# Patient Record
Sex: Male | Born: 1935 | Race: Black or African American | Hispanic: No | Marital: Married | Smoking: Current every day smoker
Health system: Southern US, Community
[De-identification: ages and names within clinical notes are randomized; demographics above are authoritative.]

## PROBLEM LIST (undated history)

## (undated) DIAGNOSIS — Z9889 Other specified postprocedural states: Secondary | ICD-10-CM

## (undated) DIAGNOSIS — R112 Nausea with vomiting, unspecified: Secondary | ICD-10-CM

## (undated) DIAGNOSIS — C801 Malignant (primary) neoplasm, unspecified: Secondary | ICD-10-CM

## (undated) DIAGNOSIS — J449 Chronic obstructive pulmonary disease, unspecified: Secondary | ICD-10-CM

## (undated) DIAGNOSIS — Z9289 Personal history of other medical treatment: Secondary | ICD-10-CM

## (undated) DIAGNOSIS — K219 Gastro-esophageal reflux disease without esophagitis: Secondary | ICD-10-CM

## (undated) DIAGNOSIS — I10 Essential (primary) hypertension: Secondary | ICD-10-CM

## (undated) HISTORY — PX: TONSILLECTOMY: SUR1361

## (undated) HISTORY — PX: OTHER SURGICAL HISTORY: SHX169

## (undated) HISTORY — PX: BACK SURGERY: SHX140

---

## 2001-07-26 ENCOUNTER — Encounter: Payer: Self-pay | Admitting: *Deleted

## 2001-07-26 ENCOUNTER — Encounter: Admission: RE | Admit: 2001-07-26 | Discharge: 2001-07-26 | Payer: Self-pay | Admitting: *Deleted

## 2001-11-21 ENCOUNTER — Encounter (INDEPENDENT_AMBULATORY_CARE_PROVIDER_SITE_OTHER): Payer: Self-pay | Admitting: *Deleted

## 2001-11-21 ENCOUNTER — Ambulatory Visit (HOSPITAL_COMMUNITY): Admission: RE | Admit: 2001-11-21 | Discharge: 2001-11-21 | Payer: Self-pay | Admitting: Gastroenterology

## 2002-09-19 ENCOUNTER — Encounter (INDEPENDENT_AMBULATORY_CARE_PROVIDER_SITE_OTHER): Payer: Self-pay | Admitting: Specialist

## 2002-09-19 ENCOUNTER — Ambulatory Visit (HOSPITAL_COMMUNITY): Admission: RE | Admit: 2002-09-19 | Discharge: 2002-09-19 | Payer: Self-pay | Admitting: Gastroenterology

## 2004-11-13 DIAGNOSIS — Z9289 Personal history of other medical treatment: Secondary | ICD-10-CM

## 2004-11-13 HISTORY — DX: Personal history of other medical treatment: Z92.89

## 2005-08-22 ENCOUNTER — Encounter: Payer: Self-pay | Admitting: Emergency Medicine

## 2005-08-23 ENCOUNTER — Inpatient Hospital Stay (HOSPITAL_COMMUNITY): Admission: EM | Admit: 2005-08-23 | Discharge: 2005-08-30 | Payer: Self-pay | Admitting: Emergency Medicine

## 2005-08-23 ENCOUNTER — Ambulatory Visit: Payer: Self-pay | Admitting: Physical Medicine & Rehabilitation

## 2005-08-30 ENCOUNTER — Inpatient Hospital Stay (HOSPITAL_COMMUNITY)
Admission: RE | Admit: 2005-08-30 | Discharge: 2005-09-06 | Payer: Self-pay | Admitting: Physical Medicine & Rehabilitation

## 2005-09-07 ENCOUNTER — Encounter
Admission: RE | Admit: 2005-09-07 | Discharge: 2005-10-08 | Payer: Self-pay | Admitting: Physical Medicine & Rehabilitation

## 2005-10-09 ENCOUNTER — Encounter
Admission: RE | Admit: 2005-10-09 | Discharge: 2005-11-07 | Payer: Self-pay | Admitting: Physical Medicine & Rehabilitation

## 2005-11-07 ENCOUNTER — Encounter
Admission: RE | Admit: 2005-11-07 | Discharge: 2006-02-05 | Payer: Self-pay | Admitting: Physical Medicine & Rehabilitation

## 2006-01-04 ENCOUNTER — Ambulatory Visit: Payer: Self-pay | Admitting: Oncology

## 2006-05-11 ENCOUNTER — Ambulatory Visit: Payer: Self-pay | Admitting: Oncology

## 2006-05-21 LAB — CBC WITH DIFFERENTIAL/PLATELET
Basophils Absolute: 0 10*3/uL (ref 0.0–0.1)
HCT: 41.8 % (ref 38.7–49.9)
HGB: 14.8 g/dL (ref 13.0–17.1)
MCH: 36.5 pg — ABNORMAL HIGH (ref 28.0–33.4)
MONO#: 0.5 10*3/uL (ref 0.1–0.9)
NEUT%: 45.5 % (ref 40.0–75.0)
Platelets: 260 10*3/uL (ref 145–400)
WBC: 2.8 10*3/uL — ABNORMAL LOW (ref 4.0–10.0)
lymph#: 1 10*3/uL (ref 0.9–3.3)

## 2006-08-30 ENCOUNTER — Encounter: Admission: RE | Admit: 2006-08-30 | Discharge: 2006-08-30 | Payer: Self-pay | Admitting: *Deleted

## 2006-11-13 DIAGNOSIS — C801 Malignant (primary) neoplasm, unspecified: Secondary | ICD-10-CM

## 2006-11-13 HISTORY — DX: Malignant (primary) neoplasm, unspecified: C80.1

## 2007-02-13 ENCOUNTER — Ambulatory Visit: Payer: Self-pay | Admitting: Oncology

## 2008-09-30 ENCOUNTER — Ambulatory Visit: Admission: RE | Admit: 2008-09-30 | Discharge: 2008-10-19 | Payer: Self-pay | Admitting: Radiation Oncology

## 2008-10-12 ENCOUNTER — Encounter: Admission: RE | Admit: 2008-10-12 | Discharge: 2008-10-12 | Payer: Self-pay | Admitting: Urology

## 2008-11-23 ENCOUNTER — Ambulatory Visit (HOSPITAL_BASED_OUTPATIENT_CLINIC_OR_DEPARTMENT_OTHER): Admission: RE | Admit: 2008-11-23 | Discharge: 2008-11-23 | Payer: Self-pay | Admitting: Urology

## 2008-12-14 ENCOUNTER — Ambulatory Visit: Admission: RE | Admit: 2008-12-14 | Discharge: 2008-12-24 | Payer: Self-pay | Admitting: Radiation Oncology

## 2009-08-24 ENCOUNTER — Encounter: Admission: RE | Admit: 2009-08-24 | Discharge: 2009-08-24 | Payer: Self-pay | Admitting: Neurosurgery

## 2011-02-27 LAB — COMPREHENSIVE METABOLIC PANEL
ALT: 29 U/L (ref 0–53)
Alkaline Phosphatase: 122 U/L — ABNORMAL HIGH (ref 39–117)
BUN: 9 mg/dL (ref 6–23)
CO2: 28 mEq/L (ref 19–32)
Calcium: 10.1 mg/dL (ref 8.4–10.5)
GFR calc non Af Amer: >60 mL/min (ref >60)
Glucose, Bld: 97 mg/dL (ref 70–99)
Sodium: 137 mEq/L (ref 135–145)
Total Protein: 7.7 g/dL (ref 6.0–8.3)

## 2011-02-27 LAB — CBC
HCT: 43.5 % (ref 39.0–52.0)
Hemoglobin: 14.9 g/dL (ref 13.0–17.0)
MCHC: 34.3 g/dL (ref 30.0–36.0)
MCV: 103.4 fL — ABNORMAL HIGH (ref 78.0–100.0)
Platelets: 254 10*3/uL (ref 150–400)
RBC: 4.21 MIL/uL — ABNORMAL LOW (ref 4.22–5.81)
RDW: 14.4 % (ref 11.5–15.5)
WBC: 3.3 10*3/uL — ABNORMAL LOW (ref 4.0–10.5)

## 2011-02-27 LAB — PROTIME-INR
INR: 0.9 (ref 0.00–1.49)
Prothrombin Time: 11.7 seconds (ref 11.6–15.2)

## 2011-03-23 ENCOUNTER — Observation Stay (HOSPITAL_COMMUNITY)
Admission: EM | Admit: 2011-03-23 | Discharge: 2011-03-24 | Disposition: A | Discharge status: Home / Self Care | Payer: Medicare Other | Attending: Internal Medicine | Admitting: Internal Medicine

## 2011-03-23 ENCOUNTER — Emergency Department (HOSPITAL_COMMUNITY): Payer: Medicare Other

## 2011-03-23 DIAGNOSIS — I10 Essential (primary) hypertension: Secondary | ICD-10-CM | POA: Insufficient documentation

## 2011-03-23 DIAGNOSIS — E860 Dehydration: Secondary | ICD-10-CM | POA: Insufficient documentation

## 2011-03-23 DIAGNOSIS — M549 Dorsalgia, unspecified: Secondary | ICD-10-CM | POA: Insufficient documentation

## 2011-03-23 DIAGNOSIS — Z79899 Other long term (current) drug therapy: Secondary | ICD-10-CM | POA: Insufficient documentation

## 2011-03-23 DIAGNOSIS — F341 Dysthymic disorder: Secondary | ICD-10-CM | POA: Insufficient documentation

## 2011-03-23 DIAGNOSIS — F1010 Alcohol abuse, uncomplicated: Secondary | ICD-10-CM | POA: Insufficient documentation

## 2011-03-23 DIAGNOSIS — F17200 Nicotine dependence, unspecified, uncomplicated: Secondary | ICD-10-CM | POA: Insufficient documentation

## 2011-03-23 DIAGNOSIS — Z7982 Long term (current) use of aspirin: Secondary | ICD-10-CM | POA: Insufficient documentation

## 2011-03-23 DIAGNOSIS — R569 Unspecified convulsions: Secondary | ICD-10-CM | POA: Insufficient documentation

## 2011-03-23 DIAGNOSIS — R55 Syncope and collapse: Principal | ICD-10-CM | POA: Insufficient documentation

## 2011-03-23 DIAGNOSIS — N400 Enlarged prostate without lower urinary tract symptoms: Secondary | ICD-10-CM | POA: Insufficient documentation

## 2011-03-23 LAB — BASIC METABOLIC PANEL
BUN: 12 mg/dL (ref 6–23)
CO2: 23 mEq/L (ref 19–32)
Calcium: 9.2 mg/dL (ref 8.4–10.5)
Chloride: 100 mEq/L (ref 96–112)
Creatinine, Ser: 0.89 mg/dL (ref 0.4–1.5)
Glucose, Bld: 95 mg/dL (ref 70–99)

## 2011-03-23 LAB — DIFFERENTIAL
Eosinophils Relative: 0 % (ref 0–5)
Lymphocytes Relative: 19 % (ref 12–46)
Monocytes Absolute: 0.5 10*3/uL (ref 0.1–1.0)
Monocytes Relative: 9 % (ref 3–12)
Neutro Abs: 3.7 10*3/uL (ref 1.7–7.7)

## 2011-03-23 LAB — CBC
HCT: 36.9 % — ABNORMAL LOW (ref 39.0–52.0)
Hemoglobin: 13.2 g/dL (ref 13.0–17.0)
MCH: 35.3 pg — ABNORMAL HIGH (ref 26.0–34.0)
MCHC: 35.8 g/dL (ref 30.0–36.0)
RDW: 13.8 % (ref 11.5–15.5)

## 2011-03-23 LAB — GLUCOSE, CAPILLARY: Glucose-Capillary: 99 mg/dL (ref 70–99)

## 2011-03-24 ENCOUNTER — Observation Stay (HOSPITAL_COMMUNITY): Payer: Medicare Other

## 2011-03-24 ENCOUNTER — Other Ambulatory Visit (HOSPITAL_COMMUNITY): Payer: Federal, State, Local not specified - PPO

## 2011-03-24 LAB — COMPREHENSIVE METABOLIC PANEL
AST: 49 U/L — ABNORMAL HIGH (ref 0–37)
CO2: 24 mEq/L (ref 19–32)
Calcium: 9 mg/dL (ref 8.4–10.5)
Creatinine, Ser: 0.91 mg/dL (ref 0.4–1.5)
GFR calc Af Amer: >60 mL/min (ref >60)
GFR calc non Af Amer: >60 mL/min (ref >60)
Total Protein: 6.5 g/dL (ref 6.0–8.3)

## 2011-03-24 LAB — CBC
Hemoglobin: 12.3 g/dL — ABNORMAL LOW (ref 13.0–17.0)
MCH: 35 pg — ABNORMAL HIGH (ref 26.0–34.0)
MCHC: 35.1 g/dL (ref 30.0–36.0)
RDW: 13.9 % (ref 11.5–15.5)

## 2011-03-24 LAB — DIFFERENTIAL
Basophils Absolute: 0 10*3/uL (ref 0.0–0.1)
Basophils Relative: 0 % (ref 0–1)
Eosinophils Absolute: 0 10*3/uL (ref 0.0–0.7)
Eosinophils Relative: 1 % (ref 0–5)
Monocytes Absolute: 0.5 10*3/uL (ref 0.1–1.0)
Monocytes Relative: 15 % — ABNORMAL HIGH (ref 3–12)
Neutro Abs: 1.6 10*3/uL — ABNORMAL LOW (ref 1.7–7.7)

## 2011-03-28 NOTE — Op Note (Signed)
NAME:  Wyatt Stein, Wyatt Stein NO.:  [id number]   MEDICAL RECORD NO.:  [id number]          PATIENT TYPE:  AMB   LOCATION:  N[facility]                         FACILITY:  [facility]   PHYSICIAN:  Lindaann Slough, M.D.  DATE OF BIRTH:  08-28-1934   DATE OF PROCEDURE:  11/23/2008  DATE OF DISCHARGE:                               OPERATIVE REPORT   PREOPERATIVE DIAGNOSIS:  Adenocarcinoma of prostate.   POSTOPERATIVE DIAGNOSIS:  Adenocarcinoma of prostate.   PROCEDURE:  I-125 seed implantation per cystoscopy.   SURGEON:  Dr. Brunilda Payor and Dr. Dayton Scrape.   ANESTHESIA:  General.   DATE OF PROCEDURE:  November 23, 2008.   INDICATIONS:  The patient is a 77 year-old male who had positive  prostate biopsy for adenocarcinoma of the prostate, Gleason score 6.  PSA at diagnosis is 6.52.  Treatment options were discussed with the  patient and he chose to have radiation treatment.  He saw Dr. Dayton Scrape,  and he advised him to have seeds implantation and the patient is  scheduled today for the procedure.   The patient was identified by his wrist band and a proper time-out was  taken.   He was then placed in the dorsal lithotomy position.  Ultrasound  planning was done by Dr. Dayton Scrape and when planning was completed under  ultrasound guidance and with Nucletron, a total of 63 seeds were  implanted in the prostate through 24 needles.  There appeared to be good  seed distribution at the end of the procedure.  The Foley catheter that  was previously inserted in the bladder was then removed.  A flexible  cystoscope was inserted in the bladder.  The anterior urethra is normal.  He has moderate prostatic hypertrophy.  The bladder mucosa is normal.  There is no stone, seed or tumor in the bladder.  The ureteral orifices  are in normal position and shape.  The cystoscope was then removed.  A  #16 Foley catheter was then inserted in the bladder.   The patient tolerated the procedure well and left the OR in  satisfactory  condition to the Post Anesthesia Care Unit.      Lindaann Slough, M.D.  Electronically Signed    MN/MEDQ  D:  11/23/2008  T:  11/23/2008  Job:  [id number]   cc:   Maryln Gottron, M.D.  Fax: [phone]

## 2011-03-30 ENCOUNTER — Other Ambulatory Visit: Payer: Self-pay | Admitting: Neurosurgery

## 2011-03-30 DIAGNOSIS — S32009A Unspecified fracture of unspecified lumbar vertebra, initial encounter for closed fracture: Secondary | ICD-10-CM

## 2011-03-31 NOTE — Discharge Summary (Signed)
NAME:  Wyatt Stein, Wyatt Stein NO.:  [id number]   MEDICAL RECORD NO.:  [id number]          PATIENT TYPE:  IPS   LOCATION:  4003                         FACILITY:  [facility]   PHYSICIAN:  Ellwood Dense, M.D.   DATE OF BIRTH:  04/04/1934   DATE OF ADMISSION:  08/30/2005  DATE OF DISCHARGE:  09/06/2005                                 DISCHARGE SUMMARY   DISCHARGE DIAGNOSIS:  1.  Spinal cord injury with L1, L2, and L4 fractures with L2 and L4      retropulsion requiring decompression with fusion T11 through S1.  2.  Traumatic hematuria resolved.  3.  Anemia.  4.  Tachycardia, resolved.   HISTORY OF PRESENT ILLNESS:  Wyatt Stein is a 77 year old male with a history  of hypertension and GERD who fell off the roof on October 10 and was noted  to be positive for alcohol with alcohol level at 191 on admission.  Evaluation revealed L1, L2, and L4 fractures, with retropulsion of L2 on L4.  A TLS was ordered initially.  Patient with DTs requiring treatment with  Ativan taper past admission.  On October 13, the patient underwent T11 to S1  decompression and fusion by Dr. Channing Mutters.  Postoperative course has been  complicated by hypoxia and tachycardia.  CT chest October 16 showed small  bilateral pleural effusions with compressive atelectasis, no PE.  Bilateral  lower extremity Dopplers were done and were negative for DVT.  Follow up  chest x-ray done on October 18 showed question of pulmonary edema.  The  patient continued using O2 for nonspecific chest wall pain secondary to  contusion.  He has also had issues with tachycardia with activity.  H&H on  downward trend to 7.6 and 22.4 and the patient is to be transfused today.  Therapies have been initiated and the patient is currently total assist to  get to edge of bed and has ambulated a few steps.  Rehab was consulted for  further therapies.   PAST MEDICAL HISTORY:  Significant for hypertension and tonsillectomy.   ALLERGIES:  No known  drug allergies.   FAMILY HISTORY:  Positive for diabetes.   SOCIAL HISTORY:  The patient is married, was independent prior to admission.  He smokes one pack per day tobacco, drinks 2-3 beers daily.  He lives in one  level home with six steps to enter.   HOSPITAL COURSE:  Wyatt Stein was admitted to rehab on August 30, 2005, for inpatient therapies to consist of PT and OT daily.  At the time of  admission, the patient was receiving second unit of packed red blood cells  secondary to drop in H&H as well as admission CT abdomen showing a small  amount of retroperitoneal bleed, a follow up CT of the abdomen and pelvis  was ordered.  This revealed posterior fusion T11 to S1 with mild adjacent  edema, subcu edema with small bilateral effusion and bibasilar atelectasis,  bladder scan revealed complex urine whisper in the bladder.  The patient had  reportedly pulled his Foley catheter out prior to admission to  rehab.  Dr.  Brunilda Payor has been following the patient along.  UA on admission showed RBCs at  too numerous to count with urine culture showing multi-species.  UA on  October 19 showed RBCs at 0 to 3 with repeat urine culture showing 60,000  colonies of significant growth.  The patient's hematuria had resolved prior  to discharge.  Follow up CBC of October 19 revealed hemoglobin 10.9,  hematocrit 31, white count 8.2, platelets 497.  Check of electrolytes  revealed sodium 139, potassium 3.8, chloride 104, CO2 29, BUN 9, creatinine  1, glucose 105.  Total protein 5.6, albumin 2.4.  AST slightly elevated at  43.  The patient's back incision was examined and was noted to be healing  well without any signs or symptoms of infection with a long linear incision  intact with sutures in place.  These were removed on October 25 prior to  discharge.  The patient's back pain was reasonably controlled during his  stay.  Initial confusion and agitation had cleared up by the time of  discharge.  During  his stay in rehab, the patient had progressed to  supervision level for transfers, supervision level for ambulating 250 feet  without assistive device, TLS was to be used whenever head of bed greater  than 30 degrees.  The patient was at set up assist for upper body bathing  and supervision for lower body bathing and dressing.  He was supervision for  toileting.  Further follow up therapies to include outpatient PT at [name] beginning September 07, 2005, at 9:30.  On September 06, 2005, the patient is discharged to home.   DISCHARGE MEDICATIONS:  Dyazide 180 mg daily, Prilosec OTC one per day,  FEOS4 325 mg b.i.d., Trazodone 50 mg q.h.s., Senokot 2 p.o. q.h.s., Oxy-IR 5  mg q.4-6h. p.r.n. pain, multi-vitamin once daily, OK to resume aspirin and  garlic.   DISCHARGE INSTRUCTIONS:  Diet is regular.  Activity level is as tolerated  with the use of walker.  No driving or lifting for 1-61 weeks.  Continue  routine back precautions.  Wear brace whenever head of bed greater than 30  degrees  or out of bed.  The patient is to follow up with Dr. Channing Mutters for  postoperative care, follow up with Dr. Brunilda Payor for routine check, follow up  with Dr. Thomasena Edis as needed.      [name], P.A.    ______________________________  Ellwood Dense, M.D.    PP/MEDQ  D:  11/08/2005  T:  11/08/2005  Job:  [id number]   cc:   Sandria Bales. Ezzard Standing, M.D.  [address], [address]  [place]  [place] [address]   Payton Doughty, M.D.  Fax: [phone]   Lindaann Slough, M.D.  Fax: [phone]

## 2011-03-31 NOTE — Discharge Summary (Signed)
NAME:  Wyatt Stein, Wyatt Stein NO.:  [id number]   MEDICAL RECORD NO.:  [id number]          PATIENT TYPE:  INP   LOCATION:  2627                         FACILITY:  [facility]   PHYSICIAN:  Cherylynn Ridges, M.D.    DATE OF BIRTH:  May 03, 1934   DATE OF ADMISSION:  08/22/2005  DATE OF DISCHARGE:  08/30/2005                                 DISCHARGE SUMMARY   The patient discharged to inpatient rehabilitation on August 30, 2005.   DISCHARGE DIAGNOSES:  1.  Status post fall from a ladder.  2.  Lumbar verterbrae-1/2 and lumbar vertebrae-4 fractures.  3.  Ileus improved.  4.  EtOH withdrawal resolved.  5.  Acute blood loss anemia improved by the time of discharge to rehab.  6.  Right first rib fracture.   PROCEDURES:  T11-S1 spinal fusion per Dr. Channing Mutters.   HISTORY ON ADMISSION:  This is a 77 year old black male who fell off a  ladder. He did have a right first rib fracture which showed no pneumothorax.  Negative CT scan of the cervical spine. He had severe lumbar fractures  L2/3/4 with osseous retropulsion of fragments and a L1 superior endplate  fracture. He was initially seen at [facility] ER and secondary to his  severe injuries was transferred to [facility]. He did not have any  neurologic deficits. He initially had an ileus and fever but was stable  enough to go to OR on August 25, 2005 for T11-S1 fusion. He had some  difficulty with ileus again postoperatively as well as increased vascular  congestion and also appeared to be going through DTs. He was covered with  the EtOH withdrawal protocol. He was mobilized and had improving status. He  did undergo a chest CT scan on August 28, 2005 due to worsening at this  showed a right pleural effusion, atelectasis and a minimal left effusion but  no evidence for pulmonary embolus. He was diuresed with Lasix and did  improve with decreased work of breathing and continued improvement in his  mental status and he was felt to be medically  stable and ready for rehab by  August 30, 2005 and was admitted to rehab for this purpose.      Shawn Rayburn, P.A.      Cherylynn Ridges, M.D.  Electronically Signed    SR/MEDQ  D:  01/03/2006  T:  01/04/2006  Job:  [id number]

## 2011-03-31 NOTE — Consult Note (Signed)
NAME:  Wyatt Stein, Wyatt Stein NO.:  [id number]   MEDICAL RECORD NO.:  [id number]          PATIENT TYPE:  EMS   LOCATION:  ED                           FACILITY:  [facility]   PHYSICIAN:  Sandria Bales. Ezzard Standing, M.D.  DATE OF BIRTH:  June 17, 1934   DATE OF CONSULTATION:  08/22/2005  DATE OF DISCHARGE:                                   CONSULTATION   HISTORY OF PRESENT ILLNESS:  Wyatt Stein is a 77 year old black male who was  apparently up on a ladder toward his roof either cleaning gutters or doing  something on the roof when he fell off.  The distance of his fall is  somewhat unknown.  Initially he was encoded as a fall of a length of 4 to 6  feet, but it appears he probably fell further than that.  He has also been  drinking today which he has downplayed the amount that he has been drinking.   He denies any history of prior stroke, seizure, or head injury.   ALLERGIES:  No known drug allergies.   MEDICATIONS:  1.  Diltiazem of unknown dosage for high blood pressure.  2.  Prilosec for reflux.  3.  Vitamins.   REVIEW OF SYSTEMS:  NEUROLOGY:  Again no history of seizure or loss of  consciousness.  PULMONARY:  He smokes cigarettes and knows this is bad for his health.  CARDIAC:  He has had hypertension for a number of years.  He underwent a  cardiac evaluation by a cardiologist whose name he cannot remember, but  apparently his evaluation was essentially negative.  GASTROINTESTINAL:  He has had in the past some chest pain which is  attributed to gastroesophageal reflux disease.  He denies, however, upper  endoscopy, liver disease, colon disease.  He has had no prior abdominal  surgery.  UROLOGIC:  No kidney stones or kidney infections.   He is retired from the [employer].   PHYSICAL EXAMINATION:  VITAL SIGNS:  Temperature 98.6, blood pressure  129/92, pulse 92, respirations 18.  His saturation is about 93%.  HEENT:  His pupils are reactive to light.  His TM's show wax  obscuring both  tympanic membranes.  He is in a cervical collar which I did not remove, I  just tightened.  He is having no neck pain per se.  LUNGS:  Clear to auscultation with symmetric breath sounds.  HEART:  Regular rate and rhythm.  I hear no murmur or rub.  ABDOMEN:  Soft.  I did not try to roll him at this time, because of the back  injury.  NEUROLOGY:  He has motor and sensory function intact in the upper and lower  extremities.  Good strength in the upper and lower extremities with no  obvious long bone injuries.   LABORATORY DATA:  Hemoglobin 12, hematocrit 36, white count 8100, PT 14, PTT  28, blood alcohol 191.  Sodium 136, potassium 4.2, creatinine 1.5.  CT scan  of his neck is negative but shows a first rib fracture on the right side.  Chest x-ray is unremarkable without evidence  of pneumothorax.  CT of his  abdomen shows an L1 superior endplate fracture and L2 and L4 lumbar  fractures with what appears to be osseous retropulsion and some  retroperitoneal hematoma.  He has no obvious intra-abdominal injury.   ADMISSION DIAGNOSES:  1.  L2, L4 fractures with osseous retropulsion.  I have been in touch with      Dr. Channing Mutters. The plan is to transfer him from [facility]      emergency room  to the [facility] emergency room.  He will      undergo a dedicated lumbar CT scan of his back on arrival to [facility].  2.  L1 fracture superior endplate fracture which will also be captured in      this dedicated CT of his back.  3.  First rib fracture on the right without associated pneumothorax.  4.  CT clearance of his neck with no neck pain.  He has not, however, had a      head CT which I think probably ought to be added with evidence of      alcohol use.  5.  Elevated blood alcohol without any obvious head injury.  6.  Hypertension.  7.  Smokes cigarettes.  8.  Gastroesophageal reflux disease.      Sandria Bales. Ezzard Standing, M.D.  Electronically Signed      DHN/MEDQ  D:  08/22/2005  T:  08/22/2005  Job:  [id number]   cc:   Georgann Housekeeper, MD  Fax: [phone]   Payton Doughty, M.D.  Fax: [phone]

## 2011-03-31 NOTE — Consult Note (Signed)
NAME:  Wyatt Stein, Wyatt Stein NO.:  [id number]   MEDICAL RECORD NO.:  [id number]          PATIENT TYPE:  IPS   LOCATION:  4003                         FACILITY:  [facility]   PHYSICIAN:  Lindaann Slough, M.D.  DATE OF BIRTH:  17-Dec-1933   DATE OF CONSULTATION:  08/31/2005  DATE OF DISCHARGE:                                   CONSULTATION   REASON FOR CONSULTATION:  Hematuria.   The patient is a 77 year old male who fell from a roof on October 10th after  being somewhat intoxicated. He suffered an L1, L2, and L4 fracture. He had a  T11 to S1 fusion on August 25, 2005. Prior to surgery, he had a Foley  catheter and apparently pulled the Foley catheter out. He started having  urethral bleeding.  Foley catheter was serted and left in place until  yesterday. He still has hematuria. According to the patient, the urine is  clearing up. Prior to this hospitalization, he had nocturia times two to  three and frequency but no dysuria or straining on urination and he has no  previous history of gross hematuria. He has been voiding on his own since  the Foley catheter has been removed.   PAST MEDICAL HISTORY:  Positive for hypertension.   FAMILY HISTORY:  Positive for diabetes.   SOCIAL HISTORY:  He is married. He does not smoke. He drinks two to three  beers per days.   MEDICATIONS:  Cardizem, Prilosec, and vitamins.   ALLERGIES:  No known drug allergies.   PAST SURGICAL HISTORY:  Tonsillectomy in and T11-S1 fusion on August 25, 2005.   REVIEW OF SYSTEMS:  He complains of fatigue and weakness of the lower  extremities, but he is able to stand with a walker. He has frequency and  gross hematuria, but no dysuria or straining on urination. Everything else  is negative.   PHYSICAL EXAMINATION:  GENERAL: This is a well built 77 year old male with  the above complaints.  VITAL SIGNS: Blood pressure 136/82, pulse 95, temperature 99.9, and  respirations 20. He is oriented to  time, place, and person.  ABDOMEN: Soft. He has a brace. I cannot palpate his abdominal organs, except  for his bladder that is not distended. He has no inguinal hernias. Penis and  meatus sound normal. Scrotum is normal. He has no hydrocele and no  testicular mass. Cords and epididymis are within normal limits. Anal and  perineal skin is normal.  RECTAL EXAM: He has no external hemorrhoids. Sphincter tone is normal.  Prostate is enlarged, 40 g, no nodules. Seminal vesicles not palpable.   He had a CT scan on August 30, 2005, that shows normal kidneys. There is no  evidence of hematoma. Hemoglobin is 10.9, hematocrit 31, WBC 8.2. BUN is 9,  creatinine 1.0.  Urinalysis shows too numerous to count rbc's and 0-2 wbc's.   IMPRESSION:  Gross hematuria probably secondary to trauma of him pulling the  Foley out.   PLAN:  Since his urine is clearing up, I do not think we need to do any  further workup or  treatment at this time. We will check urinalysis and  culture and sensitivity, and we will continue conservative management.      Lindaann Slough, M.D.  Electronically Signed     MN/MEDQ  D:  08/31/2005  T:  08/31/2005  Job:  [id number]   cc:   Ellwood Dense, M.D.  Fax: [phone]

## 2011-03-31 NOTE — Op Note (Signed)
NAME:  Wyatt Stein, Wyatt Stein NO.:  [id number]   MEDICAL RECORD NO.:  [id number]          PATIENT TYPE:  INP   LOCATION:  3112                         FACILITY:  [facility]   PHYSICIAN:  Payton Doughty, M.D.      DATE OF BIRTH:  1934/01/13   DATE OF PROCEDURE:  08/25/2005  DATE OF DISCHARGE:                                 OPERATIVE REPORT   PREOPERATIVE DIAGNOSIS:  L1, L2, and L4 compression fractures.   POSTOPERATIVE DIAGNOSIS:  L1, L2, and L4 compression fractures.   OPERATIVE PROCEDURES:  T11 to S1 segmental pedicle screw fixation, fusion,  and posterolateral arthrodesis, L2 and L4 laminectomy.   SURGEON:  Payton Doughty, M.D.   SERVICE:  Neurosurgery   ANESTHESIA:  General endotracheal anesthesia.   PREPARATION:  Betadine and alcohol wipe.   COMPLICATIONS:  None.   ASSISTANT:  Clydene Fake, M.D.   BODY OF TEXT:  This is a 77 year old black gentleman who fell off a roof and  has fractures of L1, L2, and L4.  He is taken to the operating room,  smoothly anesthetized and intubated, placed prone on the operating table.  Following shave, prep, and drape in the usual sterile fashion, the skin was  infiltrated with 1% lidocaine with 1:400,000 epinephrine.  The skin was  incised from mid S1 to mid T10 in the lamina and transverse processes of T11-  T12, L1-L2, L3-L4, L5-S1, and the sacral ala were exposed bilaterally in the  subperiosteal plane.  Interoperative x-ray confirmed correct level.  Having  confirmed correct level, hemilaminectomy was carried out on the right side  at L4 and L2.  The ligamentum flavum was removed.  The lateral margin of the  dural sac identified and retracted medially and a tap was used to compress  the bone fractures back into the vertebral body site.  There was no  compression evident.  Following decompression, pedicle screws were placed at  T11, T12, L1, L2, L3, L4, L5, and S1 bilaterally.  Interoperative x-ray  showed good placement of the  pedicle screws.  They were attached to the rod  and connected with the caps.  Interoperative x-ray showed good alignment and  good placement of the hardware.  The lamina on the left side from T11 to S1  were decorticated with a high speed drill and BNP on the extender was  placed.  On the right side, the transverse processes of L1, L2, L3, L4, L5,  and S1 were decorticated  and BNP on the extender placed there as well as across the lamina of T11,  T12, and L1.  Successive layers of 0 Vicryl, 2-0 Vicryl, and 3-0 nylon were  used to close the incision.  Betadine and Telfa dressing were made occlusive  and the patient returned to the recovery room.           ______________________________  Payton Doughty, M.D.     MWR/MEDQ  D:  08/25/2005  T:  08/25/2005  Job:  [id number]

## 2011-03-31 NOTE — Op Note (Signed)
   NAME:  Wyatt Stein, Wyatt Stein                         ACCOUNT NO.:  [id number]   MEDICAL RECORD NO.:  [id number]                   PATIENT TYPE:  AMB   LOCATION:  ENDO                                 FACILITY:  [facility]   PHYSICIAN:  Danise Edge, M.D.                DATE OF BIRTH:  07-07-1934   DATE OF PROCEDURE:  09/19/2002  DATE OF DISCHARGE:                                 OPERATIVE REPORT   PROCEDURES:  Colonoscopy with polypectomy.   PROCEDURE INDICATION:  The patient is a 77 year old male born December 09, 1933.  Approximately six months ago, the patient underwent a colonoscopy.  A  3 cm adenomatous polyp was removed from the ascending colon.  Repeat  colonoscopy was scheduled for today to ensure complete removal of the  proximal ascending colon neoplastic polyp.   ENDOSCOPIST:  Danise Edge, M.D.   PREMEDICATION:  Versed 10 mg and fentanyl 100 mcg.   ENDOSCOPE:  Olympus pediatric colonoscope.   DESCRIPTION OF PROCEDURE:  After obtaining informed consent, the patient was  placed in the left lateral decubitus position.  I administered intravenous  fentanyl and intravenous Versed to achieve conscious sedation for the  procedure.  The patient's blood pressure, oxygen saturation, and cardiac  rhythm were monitored throughout the procedure and documented in the medical  record.  Anal inspection was normal.  Digital rectal examination was normal.  The  Olympus pediatric video colonoscope was introduced into the rectum and  advanced to the cecum.  Colonic preparation for the examination today was  excellent.   RECTUM:  Normal.   SIGMOID COLON/DESCENDING COLON:  Left colonic diverticulosis.  At 30 cm from  the anal verge, a 2 mm sessile polyp was removed with the electrocautery  snare.   SPLENIC FLEXURE:  Normal.   TRANSVERSE COLON:  Normal.   HEPATIC FLEXURE:  Normal.   ASCENDING COLON:  Normal.   CECUM/ILEOCECAL VALVE:  Normal.   ASSESSMENT:  1. Left colonic  diverticulosis.  2. From the distal sigmoid colon at 30 cm from the anal verge, a 2 mm     sessile polyp was removed with the     electrocautery snare.  3. I did not detect neoplastic tissue in the ascending colon.   RECOMMENDATIONS:  Repeat colonoscopy in five years.                                               Danise Edge, M.D.    MJ/MEDQ  D:  09/19/2002  T:  09/20/2002  Job:  [id number]

## 2011-03-31 NOTE — Procedures (Signed)
[facility]  Patient:    Wyatt Stein, Wyatt Stein Visit Number: [id number] MRN: [id number]          Service Type: Attending:  Verlin Grills, M.D. Dictated by:   Verlin Grills, M.D. Proc. Date: 11/21/01                             Procedure Report  DATE OF BIRTH:  1934-07-01.  REFERRING PHYSICIAN:  PROCEDURE PERFORMED:  Colonoscopy.  ENDOSCOPIST:  Verlin Grills, M.D.  INDICATIONS FOR PROCEDURE:  The patient is a 77 year old male who is referred for screening colonoscopy with polypectomy to prevent colon cancer.  I discussed with the patient the complications associated with colonoscopy and polypectomy including a 15 per 1000 risk of bleeding and a 4 per 1000 risk of colon perforation requiring surgical repair.  The patient has signed the operative permit.  PREMEDICATION:  Fentanyl 50 mcg, Versed 12.5 mg, glucagon 1 mg.  ENDOSCOPE:  Pediatric Olympus video colonoscope.  DESCRIPTION OF PROCEDURE:  After obtaining informed consent, the patient was placed in the left lateral decubitus position.  I administered intravenous fentanyl and intravenous Versed to achieve conscious sedation for the procedure.  The patients blood pressure, oxygen saturation and cardiac rhythm were monitored throughout the procedure and documented in the medical record.  Anal inspection was normal.  Digital rectal exam revealed a nonnodular prostate.  The Olympus pediatric video colonoscope was then introduced into the rectum and advanced to the cecum.  Colonic preparation for the exam today was excellent.  Rectum:  Normal.  Sigmoid colon and descending colon:  Normal.  Splenic flexure:  Normal.  Transverse colon:  Normal.  Hepatic flexure:  Normal.  Ascending colon:  From the proximal ascending colon, a 3 cm pedunculated polyp was removed in piecemeal fashion with the electrocautery snare and submitted for pathological interpretation.   From the midascending colon, a 5 mm sessile polyp was removed with the electrocautery snare after lifting the polyp with saline injection.  Cecum and ileocecal valve:  Normal.  ASSESSMENT:  From the proximal ascending colon, a 3 cm pedunculated polyp was removed; from the midascending colon a 5 mm sessile polyp was removed. Dictated by:   Verlin Grills, M.D. Attending:  Verlin Grills, M.D. DD:  11/21/01 TD:  11/21/01 Job: [id number]

## 2011-03-31 NOTE — Consult Note (Signed)
NAME:  Wyatt Stein, BAUMGARTEN NO.:  [id number]   MEDICAL RECORD NO.:  [id number]          PATIENT TYPE:  INP   LOCATION:  3109                         FACILITY:  [facility]   PHYSICIAN:  Payton Doughty, M.D.      DATE OF BIRTH:  12-12-1933   DATE OF CONSULTATION:  08/23/2005  DATE OF DISCHARGE:                                   CONSULTATION   REQUESTING PHYSICIAN:  Dr. Sandria Bales. Newman.   PLACE OF CONSULT:  ER.   BODY OF TEXT:  I was called to see this 77 year old right-handed black  gentleman who fell about 10 feet from his roof or ladder onto a deck while  he was somewhat intoxicated.  In the [facility] ER, he was found to have  L1, L2 and L4 fractures with some retropulsed bone at L2 and L4.  He was  admitted to the Trauma Service and I was asked to see him.   Associated injury is a 1st rib fracture.   MEDICAL HISTORY:  Medical history is pertinent for hypertension.   MEDICATIONS:  He takes Diltiazem.   ALLERGIES:  He has no allergies.   SOCIAL HISTORY:  He does not smoke.  He does drink.   PHYSICAL EXAMINATION:  GENERAL:  For particulars of the H&P, refer to the  Trauma H&P.  NEUROMUSCULAR:  On my exam, he is awake, alert and oriented x3.  He does not  have much in the way of point tenderness in his back.  His cranial nerves  are intact.  His strength is full in his upper and lower extremities,  although his back is tender when he tries to roll.  Lower extremity reflexes  are 1 throughout and he has downgoing toes bilaterally.   IMAGING STUDIES:  CT shows a superior endplate fracture of L1 with minimal  displacement.  L2 and L4 are compressed approximately 30% to 40% with  retropulsed bone, leaving about an 8-mm canal at each level.   CLINICAL IMPRESSION:  Multiple lumbar fractures.  These, although not 3  column injuries, will require some fixation.   PLAN:  The plan is to bed him down for the night, place him in PAS hose, get  his pain under control and  operate on him on Friday, the 13th.          ______________________________  Payton Doughty, M.D.    MWR/MEDQ  D:  08/23/2005  T:  08/23/2005  Job:  [id number]

## 2011-03-31 NOTE — H&P (Signed)
NAME:  Wyatt Stein, Wyatt Stein NO.:  [id number]   MEDICAL RECORD NO.:  [id number]          PATIENT TYPE:  IPS   LOCATION:  4003                         FACILITY:  [facility]   PHYSICIAN:  Ranelle Oyster, M.D.DATE OF BIRTH:  [birth year]   DATE OF ADMISSION:  08/30/2005  DATE OF DISCHARGE:                                HISTORY & PHYSICAL   ATTENDING PHYSICIAN:  Ellwood Dense, M.D.   CHIEF COMPLAINT:  Back pain after a fall.   HISTORY OF PRESENT ILLNESS:  This is a 77 year old black male with history  of hypertension and reflux who fell from a roof on October 10 after being  intoxicated.  He suffered a L1, L2, L4 fracture with retropulsion at L2 and  L4.  The patient ultimately underwent a T11-S1 fusion by Dr. Channing Mutters.  Postoperative course was complicated by hypoxia and tachycardia.  Chest x-  ray and CT revealed bilateral pleural effusions and mild pulmonary edema.  Dopplers were negative for DVT.  The patient continues with pulmonary edema  on chest x-ray today and needs O2 for support.  He has chest wall due to a  contusion.  The patient has diarrhea as well as anemia and is going to need  a transfusion today.   REVIEW OF SYSTEMS:  The patient reports fatigue, shortness of breath, chest  pain, reflux, back pain, anxiety, decreased mood, insomnia.   PAST MEDICAL HISTORY:  Positive for the above plus hypertension,  tonsillectomy.   FAMILY HISTORY:  Positive for diabetes.   SOCIAL HISTORY:  The patient is independent and active prior to arrival.  Drank 2 to 3 beers a day.  He has a one-level home with 6 steps to enter.  The wife is supportive.   MEDICATIONS:  Include Cardizem, Prilosec, and vitamins prior to arrival.   ALLERGIES:  None.   LABORATORY DATA:  Hemoglobin 7.6, white count 6.5, platelets 415,000. Sodium  136, potassium 3.7, BUN 9, creatinine 0.9.   PHYSICAL EXAMINATION:  VITAL SIGNS: Blood pressure 100/84, pulse 110,  respiratory rate 16.  GENERAL: The  patient is flat, in mild distress.  HEENT: Pupils equal, round, and reactive to light and accommodation.  Extraocular eye movements are intact.  Ear, nose, and throat exam is  unremarkable except for some bruising on the tongue and cheeks as well as a  mild white coating on the tongue.  NECK:  Supple without JVD, lymphadenopathy.  CHEST:  Clear to auscultation except for some rales at the bases.  He  overall had good air movement.  HEART: Tachycardic at 105 to 110 on auscultation today.  No murmurs, rubs,  or gallops were heard.  ABDOMEN:  Soft, nontender.  EXTREMITIES: No clubbing, cyanosis, or edema.  He had a mild laceration on  the left shin.  BACK:  Back wound was clean, dry, and intact with minimal drainage.  Sutures  were in place.  NEUROLOGIC:  Cranial nerves II-XII intact.  Reflexes intact.  Sensation  normal.  The patient had fair judgment, orientation, and memory.  He lacked  some awareness.  Mood was very flat.  Motor  function generally 5/5 in all  muscles tested excepted for proximal lower extremities which were 3+ to 4/5.   ASSESSMENT:  1.  Status post fall with lumbar fractures and retropulsion L2-L4, status      post decompression and fusion.  Begin comprehensive inpatient      rehabilitation with modified independent goals.  Estimated length of      stay is 12 to 16 days.  Prognosis is fair.  2.  Pain management with p.r.n. oxycodone.  3.  Deep vein thrombosis prophylaxis:  Agree with holding Lovenox for now      due to history of retroperitoneal trauma and blood.  Use PAS hose and      thigh-high TEDs.  4.  Anemia: Transfuse 2 units of packed red blood cells today. Check CT      abdomen to r/o occult bleed.  5.  Pulmonary: Continue to watch for fluid overload especially with blood      transfusion today. Use O2 as needed. Encourage incentive spirometry.  6.  Mental status: Check sleep/wake cycle.  Schedule trazodone.  Avoid      sedating medications.  7.  Bladder:  Begin voiding trial in the morning.      Ranelle Oyster, M.D.  Electronically Signed     ZTS/MEDQ  D:  08/30/2005  T:  08/30/2005  Job:  [id number]

## 2011-04-04 NOTE — Discharge Summary (Signed)
NAME:  Wyatt Stein, Wyatt Stein NO.:  [id number]  MEDICAL RECORD NO.:  [id number]           PATIENT TYPE:  O  LOCATION:  1312                         FACILITY:  [facility]  PHYSICIAN:  Erick Blinks, MD     DATE OF BIRTH:  1933-11-21  DATE OF ADMISSION:  03/23/2011 DATE OF DISCHARGE:  03/24/2011                              DISCHARGE SUMMARY   PRIMARY CARE PHYSICIAN:  Georgann Housekeeper, MD from [facility]  DISCHARGE DIAGNOSES: 1. Vasovagal syncope, resolved. 2. History of hypertension. 3. Mild dehydration. 4. Benign prostatic hypertrophy. 5. Depression. 6. Chronic back status post back surgery.  DISCHARGE MEDICATIONS: 1. Diltiazem CD 108 mg daily 2. Multivitamins 1 tablet p.o. daily 3. Aspirin, enteric coated 81 mg daily 4. Calcium, vitamin D 1 tablet p.o. daily 5. Saw Palmetto 1 capsule p.o. daily.  ADMISSION HISTORY:  This is a 77 year old African American gentleman with a history of chronic back pains with back surgery, hypertension and benign prostatic hypertrophy who presents to the emergency room with temporary alteration in mental status.  According to his wife the patient was in his usual state of health.  He had gone to sit on his chair and when she went to check on him he was breathing heavy and had some shaking in his right hand.  This lasted a few seconds.  The patient woke up and he did not have any change in mental status.  He was speaking appropriately, there was no slurred speech.  No changes in vision.  No unilateral weakness or numbness.  The patient's wife checked his blood pressure and reportedly it was in the 80s.  EMS was called and his blood sugars checked and it was in the 90s.  He subsequently became nauseous and threw up.  He was then brought to the ER for evaluation. Please refer the history and physical dictated by Dr. Selena Batten on 10 May, 2012.  HOSPITAL COURSE: 1. Vasovagal syncope.  The patient's history was consistent with a  vasovagal syncope.  He did not have any urinary or bowel incontinence nor did he     have any postictal state.  It is unlikely this is a seizure.  His     blood pressure has been monitored here and have  been stable.  He     has not had any arrhythmias on telemetry.  His EKG showed normal     sinus rhythm.  He has been given IV fluids, he was dehydrated.  He     feels better at this time, back to his baseline.  He is ambulating     without any dizziness.  He is not having recurrence of symptoms and     is requesting to be discharged home.  He does not have any focal     deficits, therefore an MRI does not seem to be warranted at this     time, his CT head was negative.  He is asked to follow up with his     primary care physician in the next 1 week for further management.  DIAGNOSTIC IMAGING: 1. CT head shows no acute  intracranial pathology or chronic ischemic     changes.  DISCHARGE INSTRUCTIONS:  The patient should continue on his diet and activity as tolerated.  He is to follow up with his primary care physician next week.  He will also check his blood pressure at home.  He is advised to keep himself hydrated.  He is otherwise stable for discharge.  CONDITION ON DISCHARGE:  Improved.     Erick Blinks, MD     JM/MEDQ  D:  03/24/2011  T:  03/24/2011  Job:  [id number]  cc:   Georgann Housekeeper, MD Fax: [phone]  Electronically Signed by Erick Blinks  on 04/04/2011 01:53:56 PM

## 2011-04-06 ENCOUNTER — Other Ambulatory Visit: Payer: Self-pay | Admitting: Neurosurgery

## 2011-04-06 ENCOUNTER — Ambulatory Visit
Admission: RE | Admit: 2011-04-06 | Discharge: 2011-04-06 | Disposition: A | Discharge status: Home / Self Care | Payer: Medicare Other | Source: Ambulatory Visit | Attending: Neurosurgery | Admitting: Neurosurgery

## 2011-04-06 ENCOUNTER — Other Ambulatory Visit: Payer: Federal, State, Local not specified - PPO

## 2011-04-06 DIAGNOSIS — S32009A Unspecified fracture of unspecified lumbar vertebra, initial encounter for closed fracture: Secondary | ICD-10-CM

## 2011-04-18 ENCOUNTER — Ambulatory Visit: Payer: Medicare Other | Attending: Neurosurgery | Admitting: Physical Therapy

## 2011-04-18 DIAGNOSIS — R5381 Other malaise: Secondary | ICD-10-CM | POA: Insufficient documentation

## 2011-04-18 DIAGNOSIS — R262 Difficulty in walking, not elsewhere classified: Secondary | ICD-10-CM | POA: Insufficient documentation

## 2011-04-18 DIAGNOSIS — IMO0001 Reserved for inherently not codable concepts without codable children: Secondary | ICD-10-CM | POA: Insufficient documentation

## 2011-04-23 NOTE — H&P (Signed)
  NAME:  Wyatt Stein, Wyatt Stein NO.:  [id number]  MEDICAL RECORD NO.:  [id number]           PATIENT TYPE:  O  LOCATION:  1312                         FACILITY:  [facility]  PHYSICIAN:  Massie Maroon, MD        DATE OF BIRTH:  [birth year]  DATE OF ADMISSION:  03/23/2011 DATE OF DISCHARGE:                             HISTORY & PHYSICAL   ADDENDUM  Please disregard prior dictated H and P past medical history and past surgical history.  PAST MEDICAL HISTORY: 1. Chronic back pain status post T11-S1 segmental pedicle screw     fixation, fusion and posterior lateral arthrodesis and L2 and L4     laminectomy 2. Hypertension. 3. BPH.  PAST SURGICAL HISTORY: 1. Colonoscopy with polypectomy September 19, 2002 - left-sided     diverticulosis and a 2-mm sessile polyp (adenomatous). 2. August 25, 2005, T11-S1 segmental pedicle screw fixation, fusion     and posterior lateral arthrodesis and L2 and L4 laminectomy. 3.     November 23, 2008, adenocarcinoma of the prostate, status post I-125     seed implantation per cystoscopy.  ALLERGIES:  NO KNOWN DRUG ALLERGIES.     Massie Maroon, MD     JYK/MEDQ  D:  03/24/2011  T:  03/24/2011  Job:  [id number]  Electronically Signed by Pearson Grippe MD on 04/23/2011 07:08:46 PM

## 2011-04-23 NOTE — H&P (Signed)
NAME:  Wyatt Stein, Wyatt Stein NO.:  [id number]  MEDICAL RECORD NO.:  [id number]           PATIENT TYPE:  O  LOCATION:  1312                         FACILITY:  [facility]  PHYSICIAN:  Massie Maroon, MD        DATE OF BIRTH:  10/12/1934  DATE OF ADMISSION:  03/23/2011 DATE OF DISCHARGE:                             HISTORY & PHYSICAL   CHIEF COMPLAINT:  Shaking.  HISTORY OF PRESENT ILLNESS:  77 year old male with a history of hypertension, BPH, chronic back pain, apparently was sleeping and then came out to look at some plants for his wife.  He was moving slowly and when he went back in and laid down, his wife came in and found him shaking.  His arm was shaking and his head was back.  The patient's wife said his blood sugar was 80.  He had 1 episode of nausea, vomiting, and EMS brought him to the ED for evaluation.  The patient is unable to remember if he had an episode of syncope or not.  He does not believe he was syncopized.  The patient does drink at least 4 beers per day.  He states that he has been able to drink earlier today and not been without alcohol.  He has not had this happen to him in the past.  The patient had a CT brain that was negative for any acute process.  CT of the C-spine was also negative.  Chest x-ray showed no acute findings. It did show a lower chest nodular density suspicious for nipple shadow and should be repeated with nipple markers.  Urine drug screen was positive for benzodiazepines and opiates. The patient will be admitted for workup of possible seizure.  PAST MEDICAL HISTORY: 1. Chronic back pain. 2. Hypertension. 3. BPH. 4. Depression. 5. Anxiety. 6. 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 7. History of tricyclic antidepressant overdose in 2001. 8. History of pain medication overdose in 2005.  PAST SURGICAL HISTORY: 1. Cardiac catheterization December 19, 2000, no significant coronary     artery disease. 2.  Back surgery.  SOCIAL HISTORY:  The patient smokes 1 pack per day x50 plus years.  He occasionally drinks.  He is a retired Dietitian.  He was in the [employer], but non-combat.  FAMILY HISTORY:  Mother died in her 93s of heart disease.  Father died at age 27 of colon cancer.  ALLERGIES: 1. PENICILLIN. 2. ERYTHROMYCIN. 3. NSAIDS.  MEDICATIONS: 1. Cartia XT 180 mg p.o. daily. 2. Saw palmetto one p.o. daily. 3. Enteric-coated aspirin 81 mg p.o. daily.  REVIEW OF SYSTEMS:  Negative for all 10-organ systems except for pertinent positives stated above.  PHYSICAL EXAMINATION:  VITAL SIGNS:  Temperature 97.4, pulse 66, blood pressure 111/63, pulse ox 100% on room air. HEENT:  Anicteric. NECK:  No JVD, no bruit, no thyromegaly, no adenopathy. HEART:  Regular rate and rhythm, S1, S2.  No murmurs, gallops, or rubs. LUNGS:  Clear to auscultation bilaterally. ABDOMEN:  Soft, nontender, nondistended.  Positive bowel sounds. EXTREMITIES:  No cyanosis, clubbing, or edema. SKIN:  No rashes. LYMPH NODES:  No adenopathy. NEURO EXAM:  Nonfocal.  Cranial nerves II through XII intact.  Reflexes 2+, symmetric, diffuse with downgoing toes bilaterally, motor strength 5/5 in all 4 extremities, pinprick intact.  LABORATORY DATA:  Salicylate level less than 2.  Alcohol level less than 11.  Sodium 140, potassium 3.5, BUN 18, creatinine 1.09, AST 19, ALT 12. Tylenol level 15.2.  Urine drug screen; benzodiazepine positive, opiates positive.  Urinalysis negative.  WBC 9.5, hemoglobin 14.0, platelet count 232.  CT brain negative.  CT C-spine negative for any fracture.  ASSESSMENT AND PLAN: 1. Possible seizure related to alcohol use.  Check an MRI of brain,     check an EEG.  Consider neurology consult in the a.m. 2. Hypertension.  Continue Cartia XT. 3. Tobacco dependence:  The patient was counseled on smoking cessation     for 10 minutes. 4. Chronic back pain:  Tylenol for now. 5. Alcohol  dependence:  The patient is encouraged to stop drinking.     He denies any prior history of withdrawal symptoms.  Will use     Ativan 1 mg IV q.4 h. p.r.n. anxiety and agitation. 6. Deep venous thrombosis prophylaxis.  SCDs.     Massie Maroon, MD     JYK/MEDQ  D:  03/23/2011  T:  03/23/2011  Job:  [id number]  Electronically Signed by Pearson Grippe MD on 04/23/2011 07:05:40 PM

## 2011-04-25 ENCOUNTER — Ambulatory Visit: Payer: Medicare Other | Admitting: Physical Therapy

## 2012-01-09 ENCOUNTER — Other Ambulatory Visit (HOSPITAL_COMMUNITY): Payer: Self-pay | Admitting: Internal Medicine

## 2012-01-09 DIAGNOSIS — F17200 Nicotine dependence, unspecified, uncomplicated: Secondary | ICD-10-CM | POA: Diagnosis not present

## 2012-01-09 DIAGNOSIS — C61 Malignant neoplasm of prostate: Secondary | ICD-10-CM | POA: Diagnosis not present

## 2012-01-09 DIAGNOSIS — D126 Benign neoplasm of colon, unspecified: Secondary | ICD-10-CM | POA: Diagnosis not present

## 2012-01-09 DIAGNOSIS — I10 Essential (primary) hypertension: Secondary | ICD-10-CM | POA: Diagnosis not present

## 2012-01-09 DIAGNOSIS — J449 Chronic obstructive pulmonary disease, unspecified: Secondary | ICD-10-CM

## 2012-01-09 DIAGNOSIS — Z1331 Encounter for screening for depression: Secondary | ICD-10-CM | POA: Diagnosis not present

## 2012-01-09 DIAGNOSIS — E785 Hyperlipidemia, unspecified: Secondary | ICD-10-CM | POA: Diagnosis not present

## 2012-01-09 DIAGNOSIS — K219 Gastro-esophageal reflux disease without esophagitis: Secondary | ICD-10-CM | POA: Diagnosis not present

## 2012-01-18 ENCOUNTER — Ambulatory Visit (HOSPITAL_COMMUNITY)
Admission: RE | Admit: 2012-01-18 | Discharge: 2012-01-18 | Disposition: A | Discharge status: Home / Self Care | Payer: Medicare Other | Source: Ambulatory Visit | Attending: Internal Medicine | Admitting: Internal Medicine

## 2012-01-18 DIAGNOSIS — J449 Chronic obstructive pulmonary disease, unspecified: Secondary | ICD-10-CM | POA: Insufficient documentation

## 2012-01-18 DIAGNOSIS — J4489 Other specified chronic obstructive pulmonary disease: Secondary | ICD-10-CM | POA: Insufficient documentation

## 2012-01-18 MED ORDER — ALBUTEROL SULFATE (5 MG/ML) 0.5% IN NEBU
2.5000 mg | INHALATION_SOLUTION | Freq: Once | RESPIRATORY_TRACT | Status: AC
  Administered 2012-01-18: 2.5 mg via RESPIRATORY_TRACT

## 2012-02-06 ENCOUNTER — Other Ambulatory Visit: Payer: Self-pay | Admitting: Gastroenterology

## 2012-02-06 DIAGNOSIS — Z09 Encounter for follow-up examination after completed treatment for conditions other than malignant neoplasm: Secondary | ICD-10-CM | POA: Diagnosis not present

## 2012-02-06 DIAGNOSIS — D126 Benign neoplasm of colon, unspecified: Secondary | ICD-10-CM | POA: Diagnosis not present

## 2012-02-06 DIAGNOSIS — Z86010 Personal history of colonic polyps: Secondary | ICD-10-CM | POA: Diagnosis not present

## 2012-03-25 ENCOUNTER — Other Ambulatory Visit: Payer: Self-pay | Admitting: Internal Medicine

## 2012-03-25 ENCOUNTER — Ambulatory Visit
Admission: RE | Admit: 2012-03-25 | Discharge: 2012-03-25 | Disposition: A | Discharge status: Home / Self Care | Payer: Medicare Other | Source: Ambulatory Visit | Attending: Internal Medicine | Admitting: Internal Medicine

## 2012-03-25 DIAGNOSIS — G319 Degenerative disease of nervous system, unspecified: Secondary | ICD-10-CM | POA: Diagnosis not present

## 2012-03-25 DIAGNOSIS — I10 Essential (primary) hypertension: Secondary | ICD-10-CM | POA: Diagnosis not present

## 2012-03-25 DIAGNOSIS — R51 Headache: Secondary | ICD-10-CM | POA: Diagnosis not present

## 2012-04-08 ENCOUNTER — Encounter (HOSPITAL_COMMUNITY): Payer: Self-pay | Admitting: *Deleted

## 2012-04-08 ENCOUNTER — Emergency Department (HOSPITAL_COMMUNITY)
Admission: EM | Admit: 2012-04-08 | Discharge: 2012-04-08 | Disposition: A | Discharge status: Home / Self Care | Payer: Medicare Other | Attending: Emergency Medicine | Admitting: Emergency Medicine

## 2012-04-08 DIAGNOSIS — T783XXA Angioneurotic edema, initial encounter: Secondary | ICD-10-CM | POA: Diagnosis not present

## 2012-04-08 DIAGNOSIS — X58XXXA Exposure to other specified factors, initial encounter: Secondary | ICD-10-CM | POA: Insufficient documentation

## 2012-04-08 DIAGNOSIS — Z79899 Other long term (current) drug therapy: Secondary | ICD-10-CM | POA: Insufficient documentation

## 2012-04-08 MED ORDER — DIPHENHYDRAMINE HCL 25 MG PO CAPS
50.0000 mg | ORAL_CAPSULE | Freq: Once | ORAL | Status: AC
  Administered 2012-04-08: 50 mg via ORAL
  Filled 2012-04-08: qty 2

## 2012-04-08 MED ORDER — PREDNISONE 20 MG PO TABS
40.0000 mg | ORAL_TABLET | Freq: Once | ORAL | Status: AC
  Administered 2012-04-08: 40 mg via ORAL
  Filled 2012-04-08: qty 2

## 2012-04-08 MED ORDER — PREDNISONE 20 MG PO TABS: 40.0000 mg | ORAL_TABLET | Freq: Every day | ORAL | Status: AC

## 2012-04-08 MED ORDER — DIPHENHYDRAMINE HCL 25 MG PO TABS: 25.0000 mg | ORAL_TABLET | Freq: Four times a day (QID) | ORAL | Status: DC | PRN

## 2012-04-08 MED ORDER — FAMOTIDINE 20 MG PO TABS: 20.0000 mg | ORAL_TABLET | Freq: Two times a day (BID) | ORAL | Status: DC

## 2012-04-08 MED ORDER — FAMOTIDINE 20 MG PO TABS
40.0000 mg | ORAL_TABLET | Freq: Once | ORAL | Status: AC
  Administered 2012-04-08: 40 mg via ORAL
  Filled 2012-04-08: qty 2

## 2012-04-08 NOTE — ED Provider Notes (Signed)
History     CSN: [id number]  Arrival date & time 04/08/12  4098   First MD Initiated Contact with Patient 04/08/12 [phone]      Chief Complaint  Patient presents with  . Angioedema    (Consider location/radiation/quality/duration/timing/severity/associated sxs/prior treatment) HPI Comments: 78 y/o male with hx of htn - recnetly started on ramipril who states about 8PM last night he started having swelling of his L sided upper and lower lip - this has gradually gotten worse, is not associated with itching, rash or difficulty swallowing or talking.  He has no other hx of sig allergies.  Sx are persistent and moderate.    The history is provided by the patient and the spouse.    History reviewed. No pertinent past medical history.  History reviewed. No pertinent past surgical history.  History reviewed. No pertinent family history.  History  Substance Use Topics  . Smoking status: Current Everyday Smoker    Types: Cigarettes  . Smokeless tobacco: Not on file  . Alcohol Use: Yes     once a week      Review of Systems  All other systems reviewed and are negative.    Allergies  Review of patient's allergies indicates no known allergies.  Home Medications   Current Outpatient Rx  Name Route Sig Dispense Refill  . ASPIRIN 81 MG PO CHEW Oral Chew 81 mg by mouth daily.    Marland Kitchen VITAMIN D 1000 UNITS PO TABS Oral Take 1,000 Units by mouth daily.    Marland Kitchen DILTIAZEM HCL ER 180 MG PO CP24 Oral Take 180 mg by mouth daily.    Marland Kitchen GARLIC 200 MG PO TABS Oral Take 2,000 mg by mouth daily.    . CENTRUM PO CHEW Oral Chew 1 tablet by mouth daily.    Marland Kitchen OMEPRAZOLE MAGNESIUM 20 MG PO TBEC Oral Take 20 mg by mouth daily.    Marland Kitchen RAMIPRIL 2.5 MG PO CAPS Oral Take 2.5 mg by mouth daily.    Marland Kitchen DIPHENHYDRAMINE HCL 25 MG PO TABS Oral Take 1 tablet (25 mg total) by mouth every 6 (six) hours as needed for itching (Rash). 30 tablet 0  . FAMOTIDINE 20 MG PO TABS Oral Take 1 tablet (20 mg total) by mouth 2 (two)  times daily. 30 tablet 0  . PREDNISONE 20 MG PO TABS Oral Take 2 tablets (40 mg total) by mouth daily. 10 tablet 0  . SAW PALMETTO (SERENOA REPENS) 500 MG PO CAPS Oral Take 450 mg by mouth daily.      BP 155/106  Pulse 94  Temp(Src) 97.8 F (36.6 C) (Oral)  Resp 20  SpO2 98%  Physical Exam  Nursing note and vitals reviewed. Constitutional: He appears well-developed and well-nourished. No distress.  HENT:  Head: Normocephalic and atraumatic.  Mouth/Throat: Oropharynx is clear and moist. No oropharyngeal exudate.       Angioedema of the upper and lower lips on the left side with significant angioedema of the left cheek. There is normal tongue, no sublingual angioedema, clear voice, normal phonation, no difficulty speaking or swallowing  Eyes: Conjunctivae and EOM are normal. Pupils are equal, round, and reactive to light. Right eye exhibits no discharge. Left eye exhibits no discharge. No scleral icterus.  Neck: Normal range of motion. Neck supple. No JVD present. No thyromegaly present.  Cardiovascular: Normal rate, regular rhythm, normal heart sounds and intact distal pulses.  Exam reveals no gallop and no friction rub.   No murmur heard. Pulmonary/Chest: Effort  normal and breath sounds normal. No respiratory distress. He has no wheezes. He has no rales.  Abdominal: Soft. Bowel sounds are normal. He exhibits no distension and no mass. There is no tenderness.  Musculoskeletal: Normal range of motion. He exhibits no edema and no tenderness.  Lymphadenopathy:    He has no cervical adenopathy.  Neurological: He is alert. Coordination normal.  Skin: Skin is warm and dry. No rash noted. No erythema.  Psychiatric: He has a normal mood and affect. His behavior is normal.    ED Course  Procedures (including critical care time)  Labs Reviewed - No data to display No results found.   1. Angioedema of lips       MDM  Heart sounds are normal, there is slight elevation in blood  pressure but no fever tachycardia tachypnea or hypoxia. Will need observation in the emergency department after prednisone, Benadryl, Pepcid. I have counseled the patient regarding the need to immediately discard the ACE inhibitor and followup with his doctor in 2 days for a recheck of blood pressure and to recommend further management of blood pressure control.   Patient appears stable, has slightly improved angioedema, he has expressed his understanding for the need to stop the medication immediately. Will followup for blood pressure recheck this week    Discharge Prescriptions include:  #1 Pepcid  #2 prednisone  #3 Benadryl  Vida Roller, MD 04/08/12 [phone]

## 2012-04-08 NOTE — ED Notes (Signed)
Pt c/o oral and facial swelling starting at 2000 last night. Pt presents w/ angioedema, denies difficulty breathing and swelling of tongue and throat at this time.

## 2012-04-08 NOTE — Discharge Instructions (Signed)
DO NOT TAKE THE MEDICINE CALLED RAMIPRIL OR ANY MEDICINE LIKE IT EVER AGAIN.  Call your doctor first thing in the morning to make a repeat exam for early this week to have her blood pressure measured and to rearrange her medications. Please tell them that you had angioedema that was likely related to the medication called ramipril.  Take prednisone once daily as prescribed, Pepcid and Benadryl as prescribed, return for severe or worsening swelling or difficulty breathing or swallowing or change in voice.

## 2012-04-10 DIAGNOSIS — I10 Essential (primary) hypertension: Secondary | ICD-10-CM | POA: Diagnosis not present

## 2012-04-15 DIAGNOSIS — C61 Malignant neoplasm of prostate: Secondary | ICD-10-CM | POA: Diagnosis not present

## 2012-04-25 DIAGNOSIS — C61 Malignant neoplasm of prostate: Secondary | ICD-10-CM | POA: Diagnosis not present

## 2012-05-08 DIAGNOSIS — D126 Benign neoplasm of colon, unspecified: Secondary | ICD-10-CM | POA: Diagnosis not present

## 2012-07-10 DIAGNOSIS — D7589 Other specified diseases of blood and blood-forming organs: Secondary | ICD-10-CM | POA: Diagnosis not present

## 2012-07-10 DIAGNOSIS — J449 Chronic obstructive pulmonary disease, unspecified: Secondary | ICD-10-CM | POA: Diagnosis not present

## 2012-07-10 DIAGNOSIS — K219 Gastro-esophageal reflux disease without esophagitis: Secondary | ICD-10-CM | POA: Diagnosis not present

## 2012-07-10 DIAGNOSIS — I10 Essential (primary) hypertension: Secondary | ICD-10-CM | POA: Diagnosis not present

## 2012-07-18 DIAGNOSIS — S32009A Unspecified fracture of unspecified lumbar vertebra, initial encounter for closed fracture: Secondary | ICD-10-CM | POA: Diagnosis not present

## 2012-07-19 ENCOUNTER — Other Ambulatory Visit (HOSPITAL_COMMUNITY): Payer: Self-pay | Admitting: Neurosurgery

## 2012-07-19 ENCOUNTER — Other Ambulatory Visit: Payer: Self-pay | Admitting: Neurosurgery

## 2012-07-19 DIAGNOSIS — M546 Pain in thoracic spine: Secondary | ICD-10-CM

## 2012-07-19 DIAGNOSIS — M549 Dorsalgia, unspecified: Secondary | ICD-10-CM

## 2012-07-30 ENCOUNTER — Encounter (HOSPITAL_COMMUNITY): Payer: Self-pay | Admitting: Pharmacy Technician

## 2012-08-01 ENCOUNTER — Ambulatory Visit (HOSPITAL_COMMUNITY)
Admission: RE | Admit: 2012-08-01 | Discharge: 2012-08-01 | Disposition: A | Discharge status: Home / Self Care | Payer: Medicare Other | Source: Ambulatory Visit | Attending: Neurosurgery | Admitting: Neurosurgery

## 2012-08-01 DIAGNOSIS — Z981 Arthrodesis status: Secondary | ICD-10-CM | POA: Insufficient documentation

## 2012-08-01 DIAGNOSIS — IMO0002 Reserved for concepts with insufficient information to code with codable children: Secondary | ICD-10-CM | POA: Diagnosis not present

## 2012-08-01 DIAGNOSIS — M8448XA Pathological fracture, other site, initial encounter for fracture: Secondary | ICD-10-CM | POA: Diagnosis not present

## 2012-08-01 DIAGNOSIS — M546 Pain in thoracic spine: Secondary | ICD-10-CM

## 2012-08-01 DIAGNOSIS — M549 Dorsalgia, unspecified: Secondary | ICD-10-CM

## 2012-08-01 DIAGNOSIS — M51379 Other intervertebral disc degeneration, lumbosacral region without mention of lumbar back pain or lower extremity pain: Secondary | ICD-10-CM | POA: Insufficient documentation

## 2012-08-01 DIAGNOSIS — M48061 Spinal stenosis, lumbar region without neurogenic claudication: Secondary | ICD-10-CM | POA: Diagnosis not present

## 2012-08-01 DIAGNOSIS — M47814 Spondylosis without myelopathy or radiculopathy, thoracic region: Secondary | ICD-10-CM | POA: Insufficient documentation

## 2012-08-01 DIAGNOSIS — M5137 Other intervertebral disc degeneration, lumbosacral region: Secondary | ICD-10-CM | POA: Diagnosis not present

## 2012-08-01 MED ORDER — DIAZEPAM 5 MG PO TABS
10.0000 mg | ORAL_TABLET | Freq: Once | ORAL | Status: AC
  Administered 2012-08-01: 10 mg via ORAL
  Filled 2012-08-01: qty 2

## 2012-08-01 MED ORDER — IOHEXOL 300 MG/ML  SOLN
10.0000 mL | Freq: Once | INTRAMUSCULAR | Status: AC | PRN
  Administered 2012-08-01: 10 mL via INTRATHECAL

## 2012-08-01 MED ORDER — ONDANSETRON HCL 4 MG/2ML IJ SOLN: 4.0000 mg | Freq: Four times a day (QID) | INTRAMUSCULAR | Status: DC | PRN

## 2012-08-20 DIAGNOSIS — IMO0002 Reserved for concepts with insufficient information to code with codable children: Secondary | ICD-10-CM | POA: Diagnosis not present

## 2012-10-15 DIAGNOSIS — K219 Gastro-esophageal reflux disease without esophagitis: Secondary | ICD-10-CM | POA: Diagnosis not present

## 2012-10-15 DIAGNOSIS — C61 Malignant neoplasm of prostate: Secondary | ICD-10-CM | POA: Diagnosis not present

## 2012-10-15 DIAGNOSIS — Z23 Encounter for immunization: Secondary | ICD-10-CM | POA: Diagnosis not present

## 2012-10-15 DIAGNOSIS — J449 Chronic obstructive pulmonary disease, unspecified: Secondary | ICD-10-CM | POA: Diagnosis not present

## 2012-10-15 DIAGNOSIS — F17200 Nicotine dependence, unspecified, uncomplicated: Secondary | ICD-10-CM | POA: Diagnosis not present

## 2012-10-15 DIAGNOSIS — I10 Essential (primary) hypertension: Secondary | ICD-10-CM | POA: Diagnosis not present

## 2012-10-17 DIAGNOSIS — C61 Malignant neoplasm of prostate: Secondary | ICD-10-CM | POA: Diagnosis not present

## 2012-10-23 ENCOUNTER — Emergency Department (HOSPITAL_COMMUNITY): Payer: Medicare Other

## 2012-10-23 ENCOUNTER — Encounter (HOSPITAL_COMMUNITY): Payer: Self-pay | Admitting: Emergency Medicine

## 2012-10-23 ENCOUNTER — Emergency Department (HOSPITAL_COMMUNITY)
Admission: EM | Admit: 2012-10-23 | Discharge: 2012-10-23 | Disposition: A | Discharge status: Home / Self Care | Payer: Medicare Other | Attending: Emergency Medicine | Admitting: Emergency Medicine

## 2012-10-23 DIAGNOSIS — R55 Syncope and collapse: Secondary | ICD-10-CM | POA: Diagnosis not present

## 2012-10-23 DIAGNOSIS — K219 Gastro-esophageal reflux disease without esophagitis: Secondary | ICD-10-CM | POA: Insufficient documentation

## 2012-10-23 DIAGNOSIS — Z79899 Other long term (current) drug therapy: Secondary | ICD-10-CM | POA: Insufficient documentation

## 2012-10-23 DIAGNOSIS — F17200 Nicotine dependence, unspecified, uncomplicated: Secondary | ICD-10-CM | POA: Insufficient documentation

## 2012-10-23 DIAGNOSIS — F1010 Alcohol abuse, uncomplicated: Secondary | ICD-10-CM | POA: Insufficient documentation

## 2012-10-23 DIAGNOSIS — R079 Chest pain, unspecified: Secondary | ICD-10-CM | POA: Diagnosis not present

## 2012-10-23 HISTORY — DX: Gastro-esophageal reflux disease without esophagitis: K21.9

## 2012-10-23 LAB — URINALYSIS, ROUTINE W REFLEX MICROSCOPIC
Bilirubin Urine: NEGATIVE
Hgb urine dipstick: NEGATIVE
Nitrite: NEGATIVE
Protein, ur: NEGATIVE mg/dL
Specific Gravity, Urine: 1.01 (ref 1.005–1.030)
Urobilinogen, UA: 1 mg/dL (ref 0.0–1.0)

## 2012-10-23 LAB — COMPREHENSIVE METABOLIC PANEL
ALT: 55 U/L — ABNORMAL HIGH (ref 0–53)
AST: 177 U/L — ABNORMAL HIGH (ref 0–37)
CO2: 26 mEq/L (ref 19–32)
Calcium: 9.7 mg/dL (ref 8.4–10.5)
Chloride: 97 mEq/L (ref 96–112)
Creatinine, Ser: 1.18 mg/dL (ref 0.50–1.35)
GFR calc Af Amer: 67 mL/min — ABNORMAL LOW (ref >90)
GFR calc non Af Amer: 58 mL/min — ABNORMAL LOW (ref >90)
Glucose, Bld: 85 mg/dL (ref 70–99)
Total Bilirubin: 1.1 mg/dL (ref 0.3–1.2)

## 2012-10-23 LAB — CBC WITH DIFFERENTIAL/PLATELET
Eosinophils Relative: 0 % (ref 0–5)
HCT: 36.5 % — ABNORMAL LOW (ref 39.0–52.0)
Hemoglobin: 13.2 g/dL (ref 13.0–17.0)
Lymphocytes Relative: 29 % (ref 12–46)
Lymphs Abs: 0.9 10*3/uL (ref 0.7–4.0)
MCV: 101.1 fL — ABNORMAL HIGH (ref 78.0–100.0)
Monocytes Absolute: 0.6 10*3/uL (ref 0.1–1.0)
Neutro Abs: 1.6 10*3/uL — ABNORMAL LOW (ref 1.7–7.7)
RBC: 3.61 MIL/uL — ABNORMAL LOW (ref 4.22–5.81)
WBC: 3 10*3/uL — ABNORMAL LOW (ref 4.0–10.5)

## 2012-10-23 LAB — ETHANOL: Alcohol, Ethyl (B): 234 mg/dL — ABNORMAL HIGH (ref 0–11)

## 2012-10-23 LAB — POCT I-STAT TROPONIN I: Troponin i, poc: 0.02 ng/mL (ref 0.00–0.08)

## 2012-10-23 LAB — GLUCOSE, CAPILLARY: Glucose-Capillary: 91 mg/dL (ref 70–99)

## 2012-10-23 MED ORDER — SODIUM CHLORIDE 0.9 % IV SOLN
INTRAVENOUS | Status: DC
  Administered 2012-10-23: 18:00:00 via INTRAVENOUS

## 2012-10-23 NOTE — ED Provider Notes (Signed)
History     CSN: [id number]  Arrival date & time 10/23/12  1545   First MD Initiated Contact with Patient 10/23/12 1712      Chief Complaint  Patient presents with  . Fall    (Consider location/radiation/quality/duration/timing/severity/associated sxs/prior treatment) Patient is a 78 y.o. male presenting with fall. The history is provided by the patient and the spouse.  Fall   patient here with increased weakness and syncopal events associated with daily alcohol use. Patient is to drinking Christiane Ha whiskey from the bottle. He has also had poor nutrition according to his wife. No vomiting or black or bloody stools. No abdominal pain. No chest pain chest pressure. No diaphoresis. Denies any head trauma or neck pain. Wife states he is at his baseline at this time. She is concerned because of his increased diffuse whole-body weakness and weight loss  Past Medical History  Diagnosis Date  . GERD (gastroesophageal reflux disease)     History reviewed. No pertinent past surgical history.  No family history on file.  History  Substance Use Topics  . Smoking status: Current Every Day Smoker    Types: Cigarettes  . Smokeless tobacco: Not on file  . Alcohol Use: Yes     Comment: once a week      Review of Systems  All other systems reviewed and are negative.    Allergies  Ace inhibitors and Ramipril  Home Medications   Current Outpatient Rx  Name  Route  Sig  Dispense  Refill  . DILTIAZEM HCL ER 180 MG PO CP24   Oral   Take 180 mg by mouth daily.         Marland Kitchen GARLIC 200 MG PO TABS   Oral   Take 2,000 mg by mouth daily.         . CENTRUM PO CHEW   Oral   Chew 1 tablet by mouth daily.         Marland Kitchen OMEPRAZOLE MAGNESIUM 20 MG PO TBEC   Oral   Take 20 mg by mouth daily.         . SAW PALMETTO (SERENOA REPENS) 500 MG PO CAPS   Oral   Take 500 mg by mouth daily.            BP 97/65  Pulse 70  Temp 97.5 F (36.4 C) (Oral)  Resp 16  Ht 5\' 8"  (1.727  m)  Wt 146 lb (66.225 kg)  BMI 22.20 kg/m2  SpO2 99%  Physical Exam  Nursing note and vitals reviewed. Constitutional: He is oriented to person, place, and time. He appears well-developed and well-nourished.  Non-toxic appearance. No distress.  HENT:  Head: Normocephalic and atraumatic.  Eyes: Conjunctivae normal, EOM and lids are normal. Pupils are equal, round, and reactive to light.  Neck: Normal range of motion. Neck supple. No tracheal deviation present. No mass present.  Cardiovascular: Normal rate, regular rhythm and normal heart sounds.  Exam reveals no gallop.   No murmur heard. Pulmonary/Chest: Effort normal and breath sounds normal. No stridor. No respiratory distress. He has no decreased breath sounds. He has no wheezes. He has no rhonchi. He has no rales.  Abdominal: Soft. Normal appearance and bowel sounds are normal. He exhibits no distension. There is no tenderness. There is no rigidity, no rebound, no guarding and no CVA tenderness.  Musculoskeletal: Normal range of motion. He exhibits no edema and no tenderness.  Neurological: He is alert and oriented to person, place, and  time. He has normal strength. No cranial nerve deficit or sensory deficit. GCS eye subscore is 4. GCS verbal subscore is 5. GCS motor subscore is 6.  Skin: Skin is warm and dry. No abrasion and no rash noted.  Psychiatric: He has a normal mood and affect. His speech is normal and behavior is normal.    ED Course  Procedures (including critical care time)   Labs Reviewed  GLUCOSE, CAPILLARY  CBC WITH DIFFERENTIAL  COMPREHENSIVE METABOLIC PANEL  URINALYSIS, ROUTINE W REFLEX MICROSCOPIC  URINE CULTURE  ETHANOL   No results found.   No diagnosis found.    MDM  Patient with elevated alcohol level here and suspect this is the cause of his passing out. Do not suspect that he is having cardiac or neurological issues. His alcohol level is 3 times the legal limit. Repeat neurological exam at time  of discharge stable.    Date: 10/23/2012  Rate: 72  Rhythm: normal sinus rhythm  QRS Axis: normal  Intervals: normal  ST/T Wave abnormalities: normal  Conduction Disutrbances:none  Narrative Interpretation:   Old EKG Reviewed: none available       Toy Baker, MD 10/23/12 1952

## 2012-10-23 NOTE — ED Notes (Signed)
Pt presents to the ED with a complaint of a fall.  Pt's wife states" that he has severe GERD and hasn't been able to eat.  He has acted weak."  Pt's wife also states" he has not been able to lift anything and has lost weight over the last year.  Pt.  Had an un witnessed fall.  Pt himself states " I was unable to get up because I was weak."  Pt stated that with the assistance of a housekeeper he was able to get into a wheel chair.

## 2012-10-24 DIAGNOSIS — C61 Malignant neoplasm of prostate: Secondary | ICD-10-CM | POA: Diagnosis not present

## 2012-10-25 LAB — URINE CULTURE: Culture: NO GROWTH

## 2013-03-17 ENCOUNTER — Other Ambulatory Visit: Payer: Self-pay | Admitting: Internal Medicine

## 2013-03-17 DIAGNOSIS — I10 Essential (primary) hypertension: Secondary | ICD-10-CM | POA: Diagnosis not present

## 2013-03-17 DIAGNOSIS — I959 Hypotension, unspecified: Secondary | ICD-10-CM | POA: Diagnosis not present

## 2013-03-17 DIAGNOSIS — R142 Eructation: Secondary | ICD-10-CM

## 2013-03-17 DIAGNOSIS — Z1331 Encounter for screening for depression: Secondary | ICD-10-CM | POA: Diagnosis not present

## 2013-03-17 DIAGNOSIS — K219 Gastro-esophageal reflux disease without esophagitis: Secondary | ICD-10-CM | POA: Diagnosis not present

## 2013-03-28 ENCOUNTER — Other Ambulatory Visit: Payer: Self-pay | Admitting: Internal Medicine

## 2013-03-28 ENCOUNTER — Ambulatory Visit
Admission: RE | Admit: 2013-03-28 | Discharge: 2013-03-28 | Disposition: A | Discharge status: Home / Self Care | Payer: Medicare Other | Source: Ambulatory Visit | Attending: Internal Medicine | Admitting: Internal Medicine

## 2013-03-28 DIAGNOSIS — K219 Gastro-esophageal reflux disease without esophagitis: Secondary | ICD-10-CM

## 2013-03-28 DIAGNOSIS — Z23 Encounter for immunization: Secondary | ICD-10-CM | POA: Diagnosis not present

## 2013-03-28 DIAGNOSIS — C61 Malignant neoplasm of prostate: Secondary | ICD-10-CM | POA: Diagnosis not present

## 2013-03-28 DIAGNOSIS — E785 Hyperlipidemia, unspecified: Secondary | ICD-10-CM | POA: Diagnosis not present

## 2013-03-28 DIAGNOSIS — R142 Eructation: Secondary | ICD-10-CM

## 2013-03-28 DIAGNOSIS — F17200 Nicotine dependence, unspecified, uncomplicated: Secondary | ICD-10-CM | POA: Diagnosis not present

## 2013-03-28 DIAGNOSIS — Z1331 Encounter for screening for depression: Secondary | ICD-10-CM | POA: Diagnosis not present

## 2013-03-28 DIAGNOSIS — J449 Chronic obstructive pulmonary disease, unspecified: Secondary | ICD-10-CM | POA: Diagnosis not present

## 2013-03-28 DIAGNOSIS — Z0000 Encounter for general adult medical examination without abnormal findings: Secondary | ICD-10-CM | POA: Diagnosis not present

## 2013-03-28 DIAGNOSIS — I10 Essential (primary) hypertension: Secondary | ICD-10-CM | POA: Diagnosis not present

## 2013-03-28 DIAGNOSIS — K449 Diaphragmatic hernia without obstruction or gangrene: Secondary | ICD-10-CM | POA: Diagnosis not present

## 2013-05-27 DIAGNOSIS — I10 Essential (primary) hypertension: Secondary | ICD-10-CM | POA: Diagnosis not present

## 2013-05-27 DIAGNOSIS — R630 Anorexia: Secondary | ICD-10-CM | POA: Diagnosis not present

## 2013-05-27 DIAGNOSIS — F17200 Nicotine dependence, unspecified, uncomplicated: Secondary | ICD-10-CM | POA: Diagnosis not present

## 2013-05-27 DIAGNOSIS — D126 Benign neoplasm of colon, unspecified: Secondary | ICD-10-CM | POA: Diagnosis not present

## 2013-05-27 DIAGNOSIS — R634 Abnormal weight loss: Secondary | ICD-10-CM | POA: Diagnosis not present

## 2013-06-11 ENCOUNTER — Encounter (HOSPITAL_COMMUNITY): Payer: Self-pay | Admitting: *Deleted

## 2013-06-11 ENCOUNTER — Other Ambulatory Visit: Payer: Self-pay | Admitting: Gastroenterology

## 2013-06-11 ENCOUNTER — Encounter (HOSPITAL_COMMUNITY): Payer: Self-pay | Admitting: Pharmacy Technician

## 2013-07-01 ENCOUNTER — Encounter (HOSPITAL_COMMUNITY): Payer: Self-pay | Admitting: Anesthesiology

## 2013-07-01 ENCOUNTER — Encounter (HOSPITAL_COMMUNITY)
Admission: RE | Disposition: A | Discharge status: Home / Self Care | Payer: Self-pay | Source: Ambulatory Visit | Attending: Gastroenterology

## 2013-07-01 ENCOUNTER — Ambulatory Visit (HOSPITAL_COMMUNITY): Payer: Medicare Other | Admitting: Anesthesiology

## 2013-07-01 ENCOUNTER — Ambulatory Visit (HOSPITAL_COMMUNITY)
Admission: RE | Admit: 2013-07-01 | Discharge: 2013-07-01 | Disposition: A | Discharge status: Home / Self Care | Payer: Medicare Other | Source: Ambulatory Visit | Attending: Gastroenterology | Admitting: Gastroenterology

## 2013-07-01 DIAGNOSIS — Z09 Encounter for follow-up examination after completed treatment for conditions other than malignant neoplasm: Secondary | ICD-10-CM | POA: Diagnosis not present

## 2013-07-01 DIAGNOSIS — Z8546 Personal history of malignant neoplasm of prostate: Secondary | ICD-10-CM | POA: Insufficient documentation

## 2013-07-01 DIAGNOSIS — Z86010 Personal history of colonic polyps: Secondary | ICD-10-CM | POA: Diagnosis not present

## 2013-07-01 DIAGNOSIS — R748 Abnormal levels of other serum enzymes: Secondary | ICD-10-CM | POA: Diagnosis not present

## 2013-07-01 DIAGNOSIS — R630 Anorexia: Secondary | ICD-10-CM | POA: Insufficient documentation

## 2013-07-01 DIAGNOSIS — R634 Abnormal weight loss: Secondary | ICD-10-CM | POA: Diagnosis not present

## 2013-07-01 DIAGNOSIS — I10 Essential (primary) hypertension: Secondary | ICD-10-CM | POA: Diagnosis not present

## 2013-07-01 DIAGNOSIS — J449 Chronic obstructive pulmonary disease, unspecified: Secondary | ICD-10-CM | POA: Diagnosis not present

## 2013-07-01 DIAGNOSIS — K219 Gastro-esophageal reflux disease without esophagitis: Secondary | ICD-10-CM | POA: Insufficient documentation

## 2013-07-01 DIAGNOSIS — J4489 Other specified chronic obstructive pulmonary disease: Secondary | ICD-10-CM | POA: Insufficient documentation

## 2013-07-01 DIAGNOSIS — D126 Benign neoplasm of colon, unspecified: Secondary | ICD-10-CM | POA: Insufficient documentation

## 2013-07-01 DIAGNOSIS — Z1211 Encounter for screening for malignant neoplasm of colon: Secondary | ICD-10-CM | POA: Diagnosis not present

## 2013-07-01 DIAGNOSIS — R110 Nausea: Secondary | ICD-10-CM | POA: Diagnosis not present

## 2013-07-01 HISTORY — DX: Chronic obstructive pulmonary disease, unspecified: J44.9

## 2013-07-01 HISTORY — DX: Personal history of other medical treatment: Z92.89

## 2013-07-01 HISTORY — DX: Other specified postprocedural states: R11.2

## 2013-07-01 HISTORY — DX: Essential (primary) hypertension: I10

## 2013-07-01 HISTORY — DX: Malignant (primary) neoplasm, unspecified: C80.1

## 2013-07-01 HISTORY — DX: Other specified postprocedural states: Z98.890

## 2013-07-01 HISTORY — PX: ESOPHAGOGASTRODUODENOSCOPY (EGD) WITH PROPOFOL: SHX5813

## 2013-07-01 HISTORY — PX: COLONOSCOPY WITH PROPOFOL: SHX5780

## 2013-07-01 SURGERY — ESOPHAGOGASTRODUODENOSCOPY (EGD) WITH PROPOFOL
Anesthesia: Monitor Anesthesia Care

## 2013-07-01 MED ORDER — PROPOFOL INFUSION 10 MG/ML OPTIME
INTRAVENOUS | Status: DC | PRN
  Administered 2013-07-01: 140 ug/kg/min via INTRAVENOUS

## 2013-07-01 MED ORDER — FENTANYL CITRATE 0.05 MG/ML IJ SOLN
INTRAMUSCULAR | Status: DC | PRN
  Administered 2013-07-01 (×2): 50 ug via INTRAVENOUS

## 2013-07-01 MED ORDER — BUTAMBEN-TETRACAINE-BENZOCAINE 2-2-14 % EX AERO
INHALATION_SPRAY | CUTANEOUS | Status: DC | PRN
  Administered 2013-07-01: 2 via TOPICAL

## 2013-07-01 MED ORDER — GLYCOPYRROLATE 0.2 MG/ML IJ SOLN
INTRAMUSCULAR | Status: DC | PRN
  Administered 2013-07-01: .2 mg via INTRAVENOUS

## 2013-07-01 MED ORDER — LACTATED RINGERS IV SOLN
INTRAVENOUS | Status: DC | PRN
  Administered 2013-07-01: 08:00:00 via INTRAVENOUS

## 2013-07-01 MED ORDER — KETAMINE HCL 10 MG/ML IJ SOLN
INTRAMUSCULAR | Status: DC | PRN
  Administered 2013-07-01: 20 mg via INTRAVENOUS

## 2013-07-01 MED ORDER — LACTATED RINGERS IV SOLN: INTRAVENOUS | Status: DC

## 2013-07-01 MED ORDER — SODIUM CHLORIDE 0.9 % IV SOLN: INTRAVENOUS | Status: DC

## 2013-07-01 SURGICAL SUPPLY — 25 items

## 2013-07-01 NOTE — Anesthesia Preprocedure Evaluation (Addendum)
Anesthesia Evaluation  Patient identified by MRN, date of birth, ID band Patient awake    Reviewed: Allergy & Precautions, H&P , NPO status , Patient's Chart, lab work & pertinent test results  History of Anesthesia Complications (+) PONV  Airway Mallampati: II TM Distance: >3 FB Neck ROM: Full    Dental  (+) Teeth Intact and Caps,    Pulmonary COPDCurrent Smoker,    Pulmonary exam normal       Cardiovascular hypertension, Pt. on medications negative cardio ROS  Rhythm:Regular Rate:Normal     Neuro/Psych negative neurological ROS  negative psych ROS   GI/Hepatic Neg liver ROS, GERD-  ,  Endo/Other  negative endocrine ROS  Renal/GU negative Renal ROS  negative genitourinary   Musculoskeletal negative musculoskeletal ROS (+)   Abdominal   Peds negative pediatric ROS (+)  Hematology negative hematology ROS (+)   Anesthesia Other Findings   Reproductive/Obstetrics negative OB ROS                          Anesthesia Physical Anesthesia Plan  ASA: II  Anesthesia Plan: MAC   Post-op Pain Management:    Induction: Intravenous  Airway Management Planned: Simple Face Mask and Nasal Cannula  Additional Equipment:   Intra-op Plan:   Post-operative Plan:   Informed Consent: I have reviewed the patients History and Physical, chart, labs and discussed the procedure including the risks, benefits and alternatives for the proposed anesthesia with the patient or authorized representative who has indicated his/her understanding and acceptance.   Dental advisory given  Plan Discussed with: CRNA  Anesthesia Plan Comments:         Anesthesia Quick Evaluation

## 2013-07-01 NOTE — H&P (Signed)
  Problems: Anorexia, unintentional weight loss, elevated liver enzymes, macrocytic red blood cell indices, 28 mm cecal polyp removed colonoscopically in June 2014.  History: The patient is a 79 year old male born 05/24/1934. The patient has been experiencing anorexia associated with an unintentional weight loss of approximately 20 pounds over the past year. He denies abdominal pain, gastrointestinal bleeding, heartburn, indigestion, nausea, and vomiting. The patient has macrocytic red blood cell indices with normal vitamin B12 level. His AST has been slightly elevated greater than the ALT suggestive of excessive alcohol intake.  Approximately one year ago, the patient underwent colonoscopy to remove a large, flat neoplastic cecal polyp at [facility].  The patient is scheduled to undergo a surveillance colonoscopy and diagnostic esophagogastroduodenoscopy.  Past medical history: Hypertension. Chronic obstructive pulmonary disease. Gastroesophageal reflux. Prostate cancer. Spinal fusion surgery. Seed implant for prostate cancer.  Allergies: ACE inhibitors cause angioedema  Habits: The patient quit smoking cigarettes in 2013. He continues to consume alcohol.  Exam: The patient is alert and lying comfortably on the endoscopy stretcher. Abdomen is soft and nontender to palpation. Cardiac exam reveals a regular rhythm. Lungs are clear to auscultation.  Plan: Proceed with diagnostic esophagogastroduodenoscopy and surveillance colonoscopy.

## 2013-07-01 NOTE — Transfer of Care (Signed)
Immediate Anesthesia Transfer of Care Note  Patient: Wyatt Stein  Procedure(s) Performed: Procedure(s): ESOPHAGOGASTRODUODENOSCOPY (EGD) WITH PROPOFOL (N/A) COLONOSCOPY WITH PROPOFOL (N/A)  Patient Location: PACU  Anesthesia Type:MAC  Level of Consciousness: awake and alert   Airway & Oxygen Therapy: Patient Spontanous Breathing and Patient connected to nasal cannula oxygen  Post-op Assessment: Report given to PACU RN and Post -op Vital signs reviewed and stable  Post vital signs: Reviewed and stable  Complications: No apparent anesthesia complications

## 2013-07-01 NOTE — Preoperative (Signed)
Beta Blockers   Reason not to administer Beta Blockers:Not Applicable 

## 2013-07-01 NOTE — Anesthesia Postprocedure Evaluation (Signed)
Anesthesia Post Note  Patient: Wyatt Stein  Procedure(s) Performed: Procedure(s) (LRB): ESOPHAGOGASTRODUODENOSCOPY (EGD) WITH PROPOFOL (N/A) COLONOSCOPY WITH PROPOFOL (N/A)  Anesthesia type: MAC  Patient location: PACU  Post pain: Pain level controlled  Post assessment: Post-op Vital signs reviewed  Last Vitals:  Filed Vitals:   07/01/13 0911  BP: 117/87  Pulse: 90  Temp: 36.5 C  Resp: 22    Post vital signs: Reviewed  Level of consciousness: sedated  Complications: No apparent anesthesia complications

## 2013-07-01 NOTE — Op Note (Signed)
Problems: Anorexia, unintentional weight loss, elevated liver enzymes, 28 mm cecal polyp removed one year ago colonoscopically.  Endoscopist: Danise Edge  Premedication: Propofol administered by anesthesia  Procedure: Diagnostic esophagogastroduodenoscopy The patient was placed in the left lateral decubitus position. The Pentax gastroscope was passed through the posterior hypopharynx into the proximal esophagus without difficulty. The hypopharynx, larynx, and vocal cords appeared normal.  Esophagoscopy: The proximal, mid, and lower segments of the esophageal mucosa appear normal. The squamocolumnar junction is somewhat irregular and noted at 40 cm from the incisor teeth.  Gastroscopy: Retroflex view of the gastric cardia and fundus was normal. The gastric body, antrum, and pylorus appeared normal.  Duodenoscopy: The duodenal bulb and descending duodenum appeared normal.  Assessment: Normal esophagogastroduodenoscopy.  Procedure: Surveillance colonoscopy. The patient was placed in the left lateral decubitus position. Anal inspection and digital rectal exam were normal. The Pentax pediatric colonoscope was introduced into the rectum and advanced to the cecum. A normal-appearing ileocecal valve was identified. Colonic preparation for the exam today was good.  Rectum. Normal. Retroflexed view distal rectum normal.  Sigmoid colon and descending colon. Normal.  Splenic flexure. Normal.  Transverse colon. Normal.  Hepatic flexure. A 3 mm sessile polyp was removed with the cold biopsy forceps.  Ascending colon. Normal.  Cecum and ileocecal valve. Normal.  Assessment:  #1. A 3 mm sessile polyp was removed from the hepatic flexure with the cold biopsy forceps  #2. Otherwise normal surveillance proctocolonoscopy to the cecum.

## 2013-07-02 ENCOUNTER — Encounter (HOSPITAL_COMMUNITY): Payer: Self-pay | Admitting: Gastroenterology

## 2013-07-08 DIAGNOSIS — I10 Essential (primary) hypertension: Secondary | ICD-10-CM | POA: Diagnosis not present

## 2013-07-08 DIAGNOSIS — R634 Abnormal weight loss: Secondary | ICD-10-CM | POA: Diagnosis not present

## 2013-08-20 ENCOUNTER — Other Ambulatory Visit: Payer: Self-pay | Admitting: Internal Medicine

## 2013-08-20 DIAGNOSIS — R634 Abnormal weight loss: Secondary | ICD-10-CM

## 2013-08-20 DIAGNOSIS — R109 Unspecified abdominal pain: Secondary | ICD-10-CM

## 2013-08-21 DIAGNOSIS — H2510 Age-related nuclear cataract, unspecified eye: Secondary | ICD-10-CM | POA: Diagnosis not present

## 2013-08-22 ENCOUNTER — Inpatient Hospital Stay: Admission: RE | Admit: 2013-08-22 | Payer: Medicare Other | Source: Ambulatory Visit

## 2013-08-25 ENCOUNTER — Ambulatory Visit: Payer: Federal, State, Local not specified - PPO | Admitting: Neurology

## 2013-08-26 ENCOUNTER — Encounter: Payer: Self-pay | Admitting: Neurology

## 2013-08-26 ENCOUNTER — Ambulatory Visit (INDEPENDENT_AMBULATORY_CARE_PROVIDER_SITE_OTHER): Payer: Medicare Other | Admitting: Neurology

## 2013-08-26 ENCOUNTER — Ambulatory Visit
Admission: RE | Admit: 2013-08-26 | Discharge: 2013-08-26 | Disposition: A | Discharge status: Home / Self Care | Payer: Medicare Other | Source: Ambulatory Visit | Attending: Internal Medicine | Admitting: Internal Medicine

## 2013-08-26 ENCOUNTER — Encounter (INDEPENDENT_AMBULATORY_CARE_PROVIDER_SITE_OTHER): Payer: Self-pay

## 2013-08-26 VITALS — BP 121/81 | HR 100 | Ht 68.0 in | Wt 127.0 lb

## 2013-08-26 DIAGNOSIS — R569 Unspecified convulsions: Secondary | ICD-10-CM | POA: Diagnosis not present

## 2013-08-26 DIAGNOSIS — R109 Unspecified abdominal pain: Secondary | ICD-10-CM

## 2013-08-26 DIAGNOSIS — C61 Malignant neoplasm of prostate: Secondary | ICD-10-CM | POA: Diagnosis not present

## 2013-08-26 DIAGNOSIS — K7689 Other specified diseases of liver: Secondary | ICD-10-CM | POA: Diagnosis not present

## 2013-08-26 MED ORDER — IOHEXOL 300 MG/ML  SOLN
100.0000 mL | Freq: Once | INTRAMUSCULAR | Status: AC | PRN
  Administered 2013-08-26: 100 mL via INTRAVENOUS
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
## 2013-08-26 NOTE — Patient Instructions (Signed)
Overall you are doing fairly well but I do want to suggest a few things today:   As far as diagnostic testing:  1)MRI brain with and without 2)EEG  Per AAN guidelines please refrain from driving for 6 months  I would like to see you back once the workup is completed, sooner if we need to. Please call us with any interim questions, concerns, problems, updates or refill requests.   Please also call us for any test results so we can go over those with you on the phone.  My clinical assistant and will answer any of your questions and relay your messages to me and also relay most of my messages to you.   Our phone number is [phone]. We also have an after hours call service for urgent matters and there is a physician on-call for urgent questions. For any emergencies you know to call 911 or go to the nearest emergency room

## 2013-09-02 ENCOUNTER — Ambulatory Visit (INDEPENDENT_AMBULATORY_CARE_PROVIDER_SITE_OTHER): Payer: Medicare Other

## 2013-09-02 DIAGNOSIS — R569 Unspecified convulsions: Secondary | ICD-10-CM

## 2013-09-02 NOTE — Procedures (Signed)
  History:  Wyatt Stein is a 79 year old gentleman with a history of several events associated with left arm stiffening, head tilting back, and shaking. The patient was unresponsive during these events. The patient is being evaluated for possible seizures.  This is a routine EEG. No skull defects are noted. Medications include calcium supplementation, vitamin D, diltiazem, Advil, Remeron, multivitamins, and Prilosec.   EEG classification: Normal awake  Description of the recording: The background rhythms of this recording consists of a fairly well modulated medium amplitude alpha rhythm of 8 Hz that is reactive to eye opening and closure. As the record progresses, the patient appears to remain in the waking state throughout the recording. Photic stimulation was performed, resulting in a bilateral and symmetric photic driving response. Hyperventilation was also performed, resulting in a minimal buildup of the background rhythm activities without significant slowing seen. At no time during the recording does there appear to be evidence of spike or spike wave discharges or evidence of focal slowing. EKG monitor shows no evidence of cardiac rhythm abnormalities with a heart rate of 66.  Impression: This is a normal EEG recording in the waking state. No evidence of ictal or interictal discharges are seen.

## 2013-09-03 ENCOUNTER — Encounter: Payer: Self-pay | Admitting: Neurology

## 2013-09-05 ENCOUNTER — Ambulatory Visit
Admission: RE | Admit: 2013-09-05 | Discharge: 2013-09-05 | Disposition: A | Discharge status: Home / Self Care | Payer: Medicare Other | Source: Ambulatory Visit | Attending: Neurology | Admitting: Neurology

## 2013-09-05 DIAGNOSIS — R569 Unspecified convulsions: Secondary | ICD-10-CM | POA: Diagnosis not present

## 2013-09-05 MED ORDER — GADOBENATE DIMEGLUMINE 529 MG/ML IV SOLN
10.0000 mL | Freq: Once | INTRAVENOUS | Status: AC | PRN
  Administered 2013-09-05: 10 mL via INTRAVENOUS

## 2013-09-08 ENCOUNTER — Telehealth: Payer: Self-pay | Admitting: Neurology

## 2013-09-08 NOTE — Telephone Encounter (Signed)
Called patient to discuss findings of EEG and MRI. Counseled him that this does not exclude the possibility that he may be having seizures. Discussed starting an AED and he wishes to hold off at this time. He was counseled on benefits/risks.

## 2013-09-25 DIAGNOSIS — Z23 Encounter for immunization: Secondary | ICD-10-CM | POA: Diagnosis not present

## 2013-10-07 DIAGNOSIS — F17200 Nicotine dependence, unspecified, uncomplicated: Secondary | ICD-10-CM | POA: Diagnosis not present

## 2013-10-07 DIAGNOSIS — M549 Dorsalgia, unspecified: Secondary | ICD-10-CM | POA: Diagnosis not present

## 2013-10-07 DIAGNOSIS — K219 Gastro-esophageal reflux disease without esophagitis: Secondary | ICD-10-CM | POA: Diagnosis not present

## 2013-10-07 DIAGNOSIS — G609 Hereditary and idiopathic neuropathy, unspecified: Secondary | ICD-10-CM | POA: Diagnosis not present

## 2013-10-07 DIAGNOSIS — I10 Essential (primary) hypertension: Secondary | ICD-10-CM | POA: Diagnosis not present

## 2013-10-20 DIAGNOSIS — C61 Malignant neoplasm of prostate: Secondary | ICD-10-CM | POA: Diagnosis not present

## 2013-10-27 DIAGNOSIS — C61 Malignant neoplasm of prostate: Secondary | ICD-10-CM | POA: Diagnosis not present

## 2013-10-28 DIAGNOSIS — B351 Tinea unguium: Secondary | ICD-10-CM | POA: Diagnosis not present

## 2013-10-28 DIAGNOSIS — M79609 Pain in unspecified limb: Secondary | ICD-10-CM | POA: Diagnosis not present

## 2013-12-22 DIAGNOSIS — R634 Abnormal weight loss: Secondary | ICD-10-CM | POA: Diagnosis not present

## 2013-12-22 DIAGNOSIS — K219 Gastro-esophageal reflux disease without esophagitis: Secondary | ICD-10-CM | POA: Diagnosis not present

## 2013-12-22 DIAGNOSIS — R7402 Elevation of levels of lactic acid dehydrogenase (LDH): Secondary | ICD-10-CM | POA: Diagnosis not present

## 2013-12-22 DIAGNOSIS — D649 Anemia, unspecified: Secondary | ICD-10-CM | POA: Diagnosis not present

## 2013-12-22 DIAGNOSIS — F17200 Nicotine dependence, unspecified, uncomplicated: Secondary | ICD-10-CM | POA: Diagnosis not present

## 2013-12-22 DIAGNOSIS — I10 Essential (primary) hypertension: Secondary | ICD-10-CM | POA: Diagnosis not present

## 2013-12-22 DIAGNOSIS — J449 Chronic obstructive pulmonary disease, unspecified: Secondary | ICD-10-CM | POA: Diagnosis not present

## 2014-04-13 DIAGNOSIS — M545 Low back pain, unspecified: Secondary | ICD-10-CM | POA: Diagnosis not present

## 2014-04-21 DIAGNOSIS — K219 Gastro-esophageal reflux disease without esophagitis: Secondary | ICD-10-CM | POA: Diagnosis not present

## 2014-04-21 DIAGNOSIS — I10 Essential (primary) hypertension: Secondary | ICD-10-CM | POA: Diagnosis not present

## 2014-04-21 DIAGNOSIS — F341 Dysthymic disorder: Secondary | ICD-10-CM | POA: Diagnosis not present

## 2014-04-21 DIAGNOSIS — E440 Moderate protein-calorie malnutrition: Secondary | ICD-10-CM | POA: Diagnosis not present

## 2014-04-21 DIAGNOSIS — J449 Chronic obstructive pulmonary disease, unspecified: Secondary | ICD-10-CM | POA: Diagnosis not present

## 2014-04-21 DIAGNOSIS — Z1331 Encounter for screening for depression: Secondary | ICD-10-CM | POA: Diagnosis not present

## 2014-04-21 DIAGNOSIS — F17200 Nicotine dependence, unspecified, uncomplicated: Secondary | ICD-10-CM | POA: Diagnosis not present

## 2014-05-11 DIAGNOSIS — J449 Chronic obstructive pulmonary disease, unspecified: Secondary | ICD-10-CM | POA: Diagnosis not present

## 2014-07-28 ENCOUNTER — Encounter (HOSPITAL_COMMUNITY): Payer: Self-pay | Admitting: Emergency Medicine

## 2014-07-28 ENCOUNTER — Emergency Department (HOSPITAL_COMMUNITY)
Admission: EM | Admit: 2014-07-28 | Discharge: 2014-07-28 | Disposition: A | Discharge status: Home / Self Care | Payer: Medicare Other | Attending: Emergency Medicine | Admitting: Emergency Medicine

## 2014-07-28 DIAGNOSIS — R569 Unspecified convulsions: Secondary | ICD-10-CM | POA: Diagnosis not present

## 2014-07-28 DIAGNOSIS — I10 Essential (primary) hypertension: Secondary | ICD-10-CM | POA: Insufficient documentation

## 2014-07-28 DIAGNOSIS — F1010 Alcohol abuse, uncomplicated: Secondary | ICD-10-CM | POA: Diagnosis not present

## 2014-07-28 DIAGNOSIS — R17 Unspecified jaundice: Secondary | ICD-10-CM | POA: Diagnosis not present

## 2014-07-28 DIAGNOSIS — F17200 Nicotine dependence, unspecified, uncomplicated: Secondary | ICD-10-CM | POA: Diagnosis not present

## 2014-07-28 DIAGNOSIS — R112 Nausea with vomiting, unspecified: Secondary | ICD-10-CM | POA: Diagnosis not present

## 2014-07-28 DIAGNOSIS — K219 Gastro-esophageal reflux disease without esophagitis: Secondary | ICD-10-CM | POA: Diagnosis not present

## 2014-07-28 DIAGNOSIS — Z8546 Personal history of malignant neoplasm of prostate: Secondary | ICD-10-CM | POA: Insufficient documentation

## 2014-07-28 DIAGNOSIS — Z79899 Other long term (current) drug therapy: Secondary | ICD-10-CM | POA: Insufficient documentation

## 2014-07-28 DIAGNOSIS — J4489 Other specified chronic obstructive pulmonary disease: Secondary | ICD-10-CM | POA: Insufficient documentation

## 2014-07-28 DIAGNOSIS — J449 Chronic obstructive pulmonary disease, unspecified: Secondary | ICD-10-CM | POA: Insufficient documentation

## 2014-07-28 LAB — CBC WITH DIFFERENTIAL/PLATELET
Basophils Absolute: 0 10*3/uL (ref 0.0–0.1)
Basophils Relative: 0 % (ref 0–1)
Eosinophils Absolute: 0 10*3/uL (ref 0.0–0.7)
Eosinophils Relative: 0 % (ref 0–5)
HCT: 32.4 % — ABNORMAL LOW (ref 39.0–52.0)
Hemoglobin: 11.8 g/dL — ABNORMAL LOW (ref 13.0–17.0)
Lymphocytes Relative: 9 % — ABNORMAL LOW (ref 12–46)
Lymphs Abs: 0.4 10*3/uL — ABNORMAL LOW (ref 0.7–4.0)
MCH: 39.6 pg — ABNORMAL HIGH (ref 26.0–34.0)
MCHC: 36.4 g/dL — ABNORMAL HIGH (ref 30.0–36.0)
MCV: 108.7 fL — ABNORMAL HIGH (ref 78.0–100.0)
Monocytes Absolute: 0.5 10*3/uL (ref 0.1–1.0)
Monocytes Relative: 11 % (ref 3–12)
Neutro Abs: 4 10*3/uL (ref 1.7–7.7)
Neutrophils Relative %: 80 % — ABNORMAL HIGH (ref 43–77)
Platelets: 203 10*3/uL (ref 150–400)
RBC: 2.98 MIL/uL — ABNORMAL LOW (ref 4.22–5.81)
RDW: 13.6 % (ref 11.5–15.5)
WBC: 5 10*3/uL (ref 4.0–10.5)

## 2014-07-28 LAB — COMPREHENSIVE METABOLIC PANEL
ALT: 16 U/L (ref 0–53)
AST: 55 U/L — ABNORMAL HIGH (ref 0–37)
Albumin: 3.5 g/dL (ref 3.5–5.2)
Alkaline Phosphatase: 99 U/L (ref 39–117)
Anion gap: 20 — ABNORMAL HIGH (ref 5–15)
BUN: 8 mg/dL (ref 6–23)
CO2: 24 mEq/L (ref 19–32)
Calcium: 8.6 mg/dL (ref 8.4–10.5)
Chloride: 99 mEq/L (ref 96–112)
Creatinine, Ser: 1.08 mg/dL (ref 0.50–1.35)
GFR calc Af Amer: 74 mL/min — ABNORMAL LOW (ref >90)
GFR calc non Af Amer: 64 mL/min — ABNORMAL LOW (ref >90)
Glucose, Bld: 92 mg/dL (ref 70–99)
Potassium: 4 mEq/L (ref 3.7–5.3)
Sodium: 143 mEq/L (ref 137–147)
Total Bilirubin: 1.4 mg/dL — ABNORMAL HIGH (ref 0.3–1.2)
Total Protein: 6.7 g/dL (ref 6.0–8.3)

## 2014-07-28 LAB — ETHANOL: Alcohol, Ethyl (B): 18 mg/dL — ABNORMAL HIGH (ref 0–11)

## 2014-07-28 LAB — CBG MONITORING, ED: GLUCOSE-CAPILLARY: 98 mg/dL (ref 70–99)

## 2014-07-28 LAB — LIPASE, BLOOD: Lipase: 92 U/L — ABNORMAL HIGH (ref 11–59)

## 2014-07-28 MED ORDER — LORAZEPAM 1 MG PO TABS: 1.0000 mg | ORAL_TABLET | Freq: Three times a day (TID) | ORAL | Status: DC | PRN

## 2014-07-28 MED ORDER — ONDANSETRON HCL 4 MG PO TABS: 4.0000 mg | ORAL_TABLET | Freq: Four times a day (QID) | ORAL | Status: DC

## 2014-07-28 NOTE — Discharge Instructions (Signed)
Alcohol Withdrawal Anytime drug use is interfering with normal living activities it has become abuse. This includes problems with family and friends. Psychological dependence has developed when your mind tells you that the drug is needed. This is usually followed by physical dependence when a continuing increase of drugs are required to get the same feeling or "high." This is known as addiction or chemical dependency. A person's risk is much higher if there is a history of chemical dependency in the family. Mild Withdrawal Following Stopping Alcohol, When Addiction or Chemical Dependency Has Developed When a person has developed tolerance to alcohol, any sudden stopping of alcohol can cause uncomfortable physical symptoms. Most of the time these are mild and consist of tremors in the hands and increases in heart rate, breathing, and temperature. Sometimes these symptoms are associated with anxiety, panic attacks, and bad dreams. There may also be stomach upset. Normal sleep patterns are often interrupted with periods of inability to sleep (insomnia). This may last for 6 months. Because of this discomfort, many people choose to continue drinking to get rid of this discomfort and to try to feel normal. Severe Withdrawal with Decreased or No Alcohol Intake, When Addiction or Chemical Dependency Has Developed About five percent of alcoholics will develop signs of severe withdrawal when they stop using alcohol. One sign of this is development of generalized seizures (convulsions). Other signs of this are severe agitation and confusion. This may be associated with believing in things which are not real or seeing things which are not really there (delusions and hallucinations). Vitamin deficiencies are usually present if alcohol intake has been long-term. Treatment for this most often requires hospitalization and close observation. Addiction can only be helped by stopping use of all chemicals. This is hard but may  save your life. With continual alcohol use, possible outcomes are usually loss of self respect and esteem, violence, and death. Addiction cannot be cured but it can be stopped. This often requires outside help and the care of professionals. Treatment centers are listed in the yellow pages under Cocaine, Narcotics, and Alcoholics Anonymous. Most hospitals and clinics can refer you to a specialized care center. It is not necessary for you to go through the uncomfortable symptoms of withdrawal. Your caregiver can provide you with medicines that will help you through this difficult period. Try to avoid situations, friends, or drugs that made it possible for you to keep using alcohol in the past. Learn how to say no. It takes a long period of time to overcome addictions to all drugs, including alcohol. There may be many times when you feel as though you want a drink. After getting rid of the physical addiction and withdrawal, you will have a lessening of the craving which tells you that you need alcohol to feel normal. Call your caregiver if more support is needed. Learn who to talk to in your family and among your friends so that during these periods you can receive outside help. Alcoholics Anonymous (AA) has helped many people over the years. To get further help, contact AA or call your caregiver, counselor, or clergyperson. Al-Anon and Alateen are support groups for friends and family members of an alcoholic. The people who love and care for an alcoholic often need help, too. For information about these organizations, check your phone directory or call a local alcoholism treatment center.  SEEK IMMEDIATE MEDICAL CARE IF:   You have a seizure.  You have a fever.  You experience uncontrolled vomiting or you  vomit up blood. This may be bright red or look like black coffee grounds.  You have blood in the stool. This may be bright red or appear as a black, tarry, bad-smelling stool.  You become lightheaded or  faint. Do not drive if you feel this way. Have someone else drive you or call 595 for help.  You become more agitated or confused.  You develop uncontrolled anxiety.  You begin to see things that are not really there (hallucinate). Your caregiver has determined that you completely understand your medical condition, and that your mental state is back to normal. You understand that you have been treated for alcohol withdrawal, have agreed not to drink any alcohol for a minimum of 1 day, will not operate a car or other machinery for 24 hours, and have had an opportunity to ask any questions about your condition. Document Released: 08/09/2005 Document Revised: 01/22/2012 Document Reviewed: 06/17/2008 [facility] Patient Information 2015 [place], [place]. This information is not intended to replace advice given to you by your health care provider. Make sure you discuss any questions you have with your health care provider.  Behavioral Health Resources in the Community: Intensive Outpatient Programs Organization         Address                                              Phone              Notes  [place]. [address], [place], [place] [phone]   [facility] Outpatient [address] [place], [place]   [facility]: [facility] [address] [place], [place]   [place] [address]  [place], [place] or [phone]   Substance Abuse Resources Organization         Address                                Phone  Notes  [facility]  [phone]   [place]  [phone]   [facility]   Chinita Pester  [phone]   Residential & Outpatient Substance Abuse Program  [phone]   Psychological Services Organization         Address                                  Phone                Notes  [facility] [place]  [place]  [phone]   [place] [place] [address] [place] [phone] or [phone]    Mobile Crisis Teams Organization         Address  Phone  Notes  [facility], Mobile Crisis Care Unit  [phone]   [facility]  [address] [place], [place]   [facility] [address], [place] [phone]    Self-Help/Support Groups Organization         Address  Phone             Notes  [facility] [place] - variety of support groups  [place] Call for more information  Narcotics Anonymous (NA), [facility] [address], [employer] [place]  2 meetings at this location   Special educational needs teacher         Address                                                    Phone              Notes  [facility] [place],    [place]  [phone]   [facility]  [address] [place] [address], [place], [place]   [place], [place] [phone] Admissions: 8am-3pm M-F  Incent[facility] Substance [place] [address],    [place], [place] [phone]   [facility] [address] [place], [place], [place]   [facility] [address]  [place], [place]   [facility] - Intensive Outpatient [address], [place], [place]   [facility]  [place], [place] [phone] or [phone]   [facility] ([facility]) [address], [place], [place] Accepts Medicaid  [facility] [place] [address] [place] [address]  [place] [phone] Substance Abuse/Addiction Treatment   [facility] Organization         Address                                                            Phone                     Notes  [facility]  [phone]   Domenic Schwab, PhD [address] [place] [address] [place], [place]   [phone] or [phone]   [place] [facility] [place], [place] [phone]   [facility] [address] [place], [place] [phone] Insurance/Medicaid/sponsorship through [facility] and Families [address], [address] [place]                                    [place], [place] [phone] [place] [address], [place] [phone]    Dr. Adele Schilder  [phone]   [facility] Dept. 1) [address] [place] 2) [place] 3)  [address], [place] [phone]  [phone]   [place] [phone] or [phone] (After Hours)

## 2014-07-28 NOTE — ED Notes (Signed)
Per [facility], pt experienced some seizure like activity this morning. Pt has history of alcohol abuse. Pt recently lost 20 pounds. 2 episodes of vomiting today. Pt alert and oriented X4.

## 2014-07-28 NOTE — ED Provider Notes (Signed)
CSN: [id number]     Arrival date & time 07/28/14  1208 History   First MD Initiated Contact with Patient 07/28/14 1247     Chief Complaint  Patient presents with  . Seizures     (Consider location/radiation/quality/duration/timing/severity/associated sxs/prior Treatment) HPI Pt is a 80yo male with hx of GERD, HTN, prostate cancer, and COPD brought to ED from home by EMS due to reports of possible seizure-like activity.  Family states pt was less talkative and had a diffuse tremor.  Pt was attempting to walk but appeared to be unstable so family had him sit down. Pt had vomiting x2.  Per family, pt has lost about 20 pounds over last several months, at least 6 months.  Pt appears unconcerned and does not have any complaints at this time. Pt states his last drink of alcohol was yesterday. Pt does not believe he is going through withdrawal symptoms at this time. Denies nausea. Family state pt has had vomiting for last several weeks, appears to worsening with eating. Pt also has occasional    Past Medical History  Diagnosis Date  . GERD (gastroesophageal reflux disease)   . Hypertension   . Cancer 2008    prostate, sees dr [name] once a year  . History of blood transfusion 2006  . PONV (postoperative nausea and vomiting) 40 yrs ago    aspiration pneumonia after tooth extraxtion  . COPD (chronic obstructive pulmonary disease)    Past Surgical History  Procedure Laterality Date  . Back fracture surgery  2006    screws and , back brace prn  . Seed implant for prostate cancer    . Back surgery    . Tonsillectomy    . Esophagogastroduodenoscopy (egd) with propofol N/A 07/01/2013    Procedure: ESOPHAGOGASTRODUODENOSCOPY (EGD) WITH PROPOFOL;  Surgeon: Garlan Fair, MD;  Location: [facility];  Service: Endoscopy;  Laterality: N/A;  . Colonoscopy with propofol N/A 07/01/2013    Procedure: COLONOSCOPY WITH PROPOFOL;  Surgeon: Garlan Fair, MD;  Location: [facility];  Service: Endoscopy;   Laterality: N/A;   Family History  Problem Relation Age of Onset  . Colon cancer Father   . Heart Problems Mother    History  Substance Use Topics  . Smoking status: Current Every Day Smoker -- 0.50 packs/day for 30 years    Types: Cigarettes  . Smokeless tobacco: Never Used  . Alcohol Use: 8.4 oz/week    14 Shots of liquor per week    Review of Systems  Constitutional: Positive for unexpected weight change ( 20lbs in last several months, "about 6"). Negative for fever and chills.  Respiratory: Negative for cough and shortness of breath.   Cardiovascular: Negative for chest pain and palpitations.  Gastrointestinal: Positive for nausea and vomiting ( x2). Negative for abdominal pain and diarrhea.  Genitourinary: Negative for dysuria, urgency, hematuria and flank pain.  Neurological: Positive for tremors, seizures ( possible seizure-like activity) and weakness. Negative for light-headedness and headaches.  All other systems reviewed and are negative.     Allergies  Ace inhibitors and Ramipril  Home Medications   Prior to Admission medications   Medication Sig Start Date End Date Taking? Authorizing Provider  cholecalciferol (VITAMIN D) 1000 UNITS tablet Take 1,000 Units by mouth daily.   Yes Historical Provider, MD  ibuprofen (ADVIL,MOTRIN) 200 MG tablet Take 200 mg by mouth every 6 (six) hours as needed for pain.   Yes Historical Provider, MD  Multiple Vitamins-Minerals (CENTRUM SILVER ADULT 50+) TABS  Take 1 tablet by mouth daily.   Yes Historical Provider, MD  omeprazole (PRILOSEC OTC) 20 MG tablet Take 20 mg by mouth daily as needed (for heartburn).    Yes Historical Provider, MD  LORazepam (ATIVAN) 1 MG tablet Take 1 tablet (1 mg total) by mouth 3 (three) times daily as needed for anxiety or seizure. 07/28/14   Noland Fordyce, PA-C  ondansetron (ZOFRAN) 4 MG tablet Take 1 tablet (4 mg total) by mouth every 6 (six) hours. 07/28/14   Noland Fordyce, PA-C   BP 128/90  Pulse 75   Temp(Src) 98.7 F (37.1 C) (Oral)  Resp 15  SpO2 99% Physical Exam  Nursing note and vitals reviewed. Constitutional: He is oriented to person, place, and time. He appears well-developed and well-nourished.  Pt lying in exam bed, NAD  HENT:  Head: Normocephalic and atraumatic.  Eyes: Conjunctivae and EOM are normal. Pupils are equal, round, and reactive to light. Scleral icterus is present.  Neck: Normal range of motion.  Cardiovascular: Normal rate, regular rhythm and normal heart sounds.   Pulmonary/Chest: Effort normal and breath sounds normal. No respiratory distress. He has no wheezes. He has no rales. He exhibits no tenderness.  Abdominal: Soft. Bowel sounds are normal. He exhibits no distension and no mass. There is no tenderness. There is no rebound and no guarding.  Musculoskeletal: Normal range of motion.  Neurological: He is alert and oriented to person, place, and time. He has normal strength. No cranial nerve deficit or sensory deficit. GCS eye subscore is 4. GCS verbal subscore is 5. GCS motor subscore is 6.  Alert and oriented, speech fluent, sensation in tact, 5/5 strength in upper and lower extremities bilaterally.   Skin: Skin is warm and dry.    ED Course  Procedures (including critical care time) Labs Review Labs Reviewed  CBC WITH DIFFERENTIAL - Abnormal; Notable for the following:    RBC 2.98 (*)    Hemoglobin 11.8 (*)    HCT 32.4 (*)    MCV 108.7 (*)    MCH 39.6 (*)    MCHC 36.4 (*)    Neutrophils Relative % 80 (*)    Lymphocytes Relative 9 (*)    Lymphs Abs 0.4 (*)    All other components within normal limits  COMPREHENSIVE METABOLIC PANEL - Abnormal; Notable for the following:    AST 55 (*)    Total Bilirubin 1.4 (*)    GFR calc non Af Amer 64 (*)    GFR calc Af Amer 74 (*)    Anion gap 20 (*)    All other components within normal limits  LIPASE, BLOOD - Abnormal; Notable for the following:    Lipase 92 (*)    All other components within normal  limits  ETHANOL - Abnormal; Notable for the following:    Alcohol, Ethyl (B) 18 (*)    All other components within normal limits  CBG MONITORING, ED    Imaging Review No results found.   EKG Interpretation None      MDM   Final diagnoses:  Alcohol abuse  Non-intractable vomiting with nausea, vomiting of unspecified type  Seizure-like activity    Pt is a 80yo male with hx of alcohol abuse presenting to ED with c/o seizure-like activity with nausea and vomiting.  Pt appears well, non-toxic. NAD. No complaints in ED.  No seizures or vomiting in ED.  Neuro exam unremarkable, no focal neuro deficits.  Labs: lipase mildly elevated, otherwise unremarkable.  Discussed pt with Dr. Alvino Chapel. Pt does not appear to be having withdrawal symptoms at this time, and reports of seizure activity not concerning for true seizure as pt was ambulating around house during the "seizure like activity"  Will discharge pt home with ativan and zofran for symptomatic control as pt is going to attempt to stop drinking alcohol. Resource guide and home care instructions also provided. Pt may be discharged home, no further workup indicated at this. Return precautions provided. Pt and family verbalized understanding and agreement with tx plan.     Noland Fordyce, PA-C 07/28/14 [phone]

## 2014-07-28 NOTE — ED Notes (Signed)
CBG resulted 98.

## 2014-07-29 NOTE — ED Provider Notes (Signed)
Medical screening examination/treatment/procedure(s) were conducted as a shared visit with non-physician practitioner(s) and myself.  I personally evaluated the patient during the encounter.   EKG Interpretation None     Shaking. Doubt seizure activity, but may be related to alocohol withdrawal. Will d/c. Does not appear to be severe at this time  Jasper Riling. Alvino Chapel, MD 07/29/14 1500

## 2014-09-28 DIAGNOSIS — M4035 Flatback syndrome, thoracolumbar region: Secondary | ICD-10-CM | POA: Diagnosis not present

## 2014-10-21 DIAGNOSIS — C61 Malignant neoplasm of prostate: Secondary | ICD-10-CM | POA: Diagnosis not present

## 2014-10-28 DIAGNOSIS — C61 Malignant neoplasm of prostate: Secondary | ICD-10-CM | POA: Diagnosis not present

## 2014-11-19 DIAGNOSIS — H2513 Age-related nuclear cataract, bilateral: Secondary | ICD-10-CM | POA: Diagnosis not present

## 2014-11-19 DIAGNOSIS — H40023 Open angle with borderline findings, high risk, bilateral: Secondary | ICD-10-CM | POA: Diagnosis not present

## 2014-11-19 DIAGNOSIS — H4322 Crystalline deposits in vitreous body, left eye: Secondary | ICD-10-CM | POA: Diagnosis not present

## 2015-02-18 DIAGNOSIS — H2513 Age-related nuclear cataract, bilateral: Secondary | ICD-10-CM | POA: Diagnosis not present

## 2015-02-18 DIAGNOSIS — H40023 Open angle with borderline findings, high risk, bilateral: Secondary | ICD-10-CM | POA: Diagnosis not present

## 2015-02-18 DIAGNOSIS — H4322 Crystalline deposits in vitreous body, left eye: Secondary | ICD-10-CM | POA: Diagnosis not present

## 2015-02-18 DIAGNOSIS — H43813 Vitreous degeneration, bilateral: Secondary | ICD-10-CM | POA: Diagnosis not present

## 2015-05-21 DIAGNOSIS — H401232 Low-tension glaucoma, bilateral, moderate stage: Secondary | ICD-10-CM | POA: Diagnosis not present

## 2015-05-21 DIAGNOSIS — H4322 Crystalline deposits in vitreous body, left eye: Secondary | ICD-10-CM | POA: Diagnosis not present

## 2015-05-21 DIAGNOSIS — H2513 Age-related nuclear cataract, bilateral: Secondary | ICD-10-CM | POA: Diagnosis not present

## 2015-05-21 DIAGNOSIS — H43813 Vitreous degeneration, bilateral: Secondary | ICD-10-CM | POA: Diagnosis not present

## 2015-08-15 ENCOUNTER — Emergency Department (HOSPITAL_COMMUNITY): Payer: Medicare Other

## 2015-08-15 ENCOUNTER — Encounter (HOSPITAL_COMMUNITY): Payer: Self-pay

## 2015-08-15 ENCOUNTER — Inpatient Hospital Stay (HOSPITAL_COMMUNITY)
Admission: EM | Admit: 2015-08-15 | Discharge: 2015-08-20 | DRG: 481 | Disposition: A | Discharge status: Skilled Nursing Facility | Payer: Medicare Other | Attending: Internal Medicine | Admitting: Internal Medicine

## 2015-08-15 DIAGNOSIS — M21252 Flexion deformity, left hip: Secondary | ICD-10-CM | POA: Diagnosis not present

## 2015-08-15 DIAGNOSIS — R569 Unspecified convulsions: Secondary | ICD-10-CM | POA: Insufficient documentation

## 2015-08-15 DIAGNOSIS — K219 Gastro-esophageal reflux disease without esophagitis: Secondary | ICD-10-CM | POA: Diagnosis present

## 2015-08-15 DIAGNOSIS — Y92015 Private garage of single-family (private) house as the place of occurrence of the external cause: Secondary | ICD-10-CM | POA: Diagnosis not present

## 2015-08-15 DIAGNOSIS — Z9119 Patient's noncompliance with other medical treatment and regimen: Secondary | ICD-10-CM | POA: Diagnosis not present

## 2015-08-15 DIAGNOSIS — S72142A Displaced intertrochanteric fracture of left femur, initial encounter for closed fracture: Principal | ICD-10-CM | POA: Diagnosis present

## 2015-08-15 DIAGNOSIS — J449 Chronic obstructive pulmonary disease, unspecified: Secondary | ICD-10-CM | POA: Diagnosis present

## 2015-08-15 DIAGNOSIS — D508 Other iron deficiency anemias: Secondary | ICD-10-CM | POA: Diagnosis not present

## 2015-08-15 DIAGNOSIS — D6489 Other specified anemias: Secondary | ICD-10-CM

## 2015-08-15 DIAGNOSIS — Z01818 Encounter for other preprocedural examination: Secondary | ICD-10-CM | POA: Diagnosis not present

## 2015-08-15 DIAGNOSIS — R55 Syncope and collapse: Secondary | ICD-10-CM | POA: Diagnosis present

## 2015-08-15 DIAGNOSIS — G319 Degenerative disease of nervous system, unspecified: Secondary | ICD-10-CM | POA: Diagnosis present

## 2015-08-15 DIAGNOSIS — F419 Anxiety disorder, unspecified: Secondary | ICD-10-CM | POA: Diagnosis present

## 2015-08-15 DIAGNOSIS — F17210 Nicotine dependence, cigarettes, uncomplicated: Secondary | ICD-10-CM | POA: Diagnosis not present

## 2015-08-15 DIAGNOSIS — D638 Anemia in other chronic diseases classified elsewhere: Secondary | ICD-10-CM | POA: Diagnosis present

## 2015-08-15 DIAGNOSIS — R9431 Abnormal electrocardiogram [ECG] [EKG]: Secondary | ICD-10-CM | POA: Diagnosis present

## 2015-08-15 DIAGNOSIS — D62 Acute posthemorrhagic anemia: Secondary | ICD-10-CM | POA: Diagnosis not present

## 2015-08-15 DIAGNOSIS — W1830XA Fall on same level, unspecified, initial encounter: Secondary | ICD-10-CM | POA: Diagnosis present

## 2015-08-15 DIAGNOSIS — F329 Major depressive disorder, single episode, unspecified: Secondary | ICD-10-CM | POA: Diagnosis not present

## 2015-08-15 DIAGNOSIS — E876 Hypokalemia: Secondary | ICD-10-CM | POA: Diagnosis present

## 2015-08-15 DIAGNOSIS — Z8546 Personal history of malignant neoplasm of prostate: Secondary | ICD-10-CM | POA: Diagnosis not present

## 2015-08-15 DIAGNOSIS — S72142D Displaced intertrochanteric fracture of left femur, subsequent encounter for closed fracture with routine healing: Secondary | ICD-10-CM | POA: Diagnosis not present

## 2015-08-15 DIAGNOSIS — Z800 Family history of malignant neoplasm of digestive organs: Secondary | ICD-10-CM

## 2015-08-15 DIAGNOSIS — E440 Moderate protein-calorie malnutrition: Secondary | ICD-10-CM | POA: Insufficient documentation

## 2015-08-15 DIAGNOSIS — I4581 Long QT syndrome: Secondary | ICD-10-CM

## 2015-08-15 DIAGNOSIS — E860 Dehydration: Secondary | ICD-10-CM | POA: Diagnosis present

## 2015-08-15 DIAGNOSIS — Z419 Encounter for procedure for purposes other than remedying health state, unspecified: Secondary | ICD-10-CM

## 2015-08-15 DIAGNOSIS — F10239 Alcohol dependence with withdrawal, unspecified: Secondary | ICD-10-CM | POA: Diagnosis present

## 2015-08-15 DIAGNOSIS — R197 Diarrhea, unspecified: Secondary | ICD-10-CM | POA: Diagnosis present

## 2015-08-15 DIAGNOSIS — Z01811 Encounter for preprocedural respiratory examination: Secondary | ICD-10-CM

## 2015-08-15 DIAGNOSIS — R319 Hematuria, unspecified: Secondary | ICD-10-CM | POA: Diagnosis not present

## 2015-08-15 DIAGNOSIS — S72002A Fracture of unspecified part of neck of left femur, initial encounter for closed fracture: Secondary | ICD-10-CM | POA: Diagnosis not present

## 2015-08-15 DIAGNOSIS — R404 Transient alteration of awareness: Secondary | ICD-10-CM | POA: Diagnosis not present

## 2015-08-15 DIAGNOSIS — M6281 Muscle weakness (generalized): Secondary | ICD-10-CM | POA: Diagnosis not present

## 2015-08-15 DIAGNOSIS — R0600 Dyspnea, unspecified: Secondary | ICD-10-CM | POA: Diagnosis not present

## 2015-08-15 DIAGNOSIS — Z79899 Other long term (current) drug therapy: Secondary | ICD-10-CM

## 2015-08-15 DIAGNOSIS — M25552 Pain in left hip: Secondary | ICD-10-CM | POA: Diagnosis present

## 2015-08-15 DIAGNOSIS — R41841 Cognitive communication deficit: Secondary | ICD-10-CM | POA: Diagnosis not present

## 2015-08-15 DIAGNOSIS — F1010 Alcohol abuse, uncomplicated: Secondary | ICD-10-CM | POA: Diagnosis not present

## 2015-08-15 DIAGNOSIS — I10 Essential (primary) hypertension: Secondary | ICD-10-CM | POA: Diagnosis present

## 2015-08-15 DIAGNOSIS — Z9181 History of falling: Secondary | ICD-10-CM | POA: Diagnosis not present

## 2015-08-15 DIAGNOSIS — R2681 Unsteadiness on feet: Secondary | ICD-10-CM | POA: Diagnosis not present

## 2015-08-15 LAB — BASIC METABOLIC PANEL
Anion gap: 13 (ref 5–15)
BUN: 12 mg/dL (ref 6–20)
CALCIUM: 7.9 mg/dL — AB (ref 8.9–10.3)
CHLORIDE: 98 mmol/L — AB (ref 101–111)
CO2: 23 mmol/L (ref 22–32)
Creatinine, Ser: 1.12 mg/dL (ref 0.61–1.24)
GFR calc Af Amer: >60 mL/min (ref >60)
GFR calc non Af Amer: >60 mL/min (ref >60)
GLUCOSE: 97 mg/dL (ref 65–99)
Potassium: 3.3 mmol/L — ABNORMAL LOW (ref 3.5–5.1)
Sodium: 134 mmol/L — ABNORMAL LOW (ref 135–145)

## 2015-08-15 LAB — ABO/RH: ABO/RH(D): B POS

## 2015-08-15 LAB — CBC WITH DIFFERENTIAL/PLATELET
BASOS PCT: 0 %
Basophils Absolute: 0 10*3/uL (ref 0.0–0.1)
Eosinophils Absolute: 0 10*3/uL (ref 0.0–0.7)
Eosinophils Relative: 0 %
HCT: 27.6 % — ABNORMAL LOW (ref 39.0–52.0)
Hemoglobin: 9.8 g/dL — ABNORMAL LOW (ref 13.0–17.0)
Lymphocytes Relative: 10 %
Lymphs Abs: 0.4 10*3/uL — ABNORMAL LOW (ref 0.7–4.0)
MCH: 37 pg — ABNORMAL HIGH (ref 26.0–34.0)
MCHC: 35.5 g/dL (ref 30.0–36.0)
MCV: 104.2 fL — AB (ref 78.0–100.0)
MONO ABS: 0.6 10*3/uL (ref 0.1–1.0)
Monocytes Relative: 14 %
Neutro Abs: 2.9 10*3/uL (ref 1.7–7.7)
Neutrophils Relative %: 76 %
Platelets: 174 10*3/uL (ref 150–400)
RBC: 2.65 MIL/uL — ABNORMAL LOW (ref 4.22–5.81)
RDW: 12.9 % (ref 11.5–15.5)
WBC: 3.9 10*3/uL — ABNORMAL LOW (ref 4.0–10.5)

## 2015-08-15 LAB — URINALYSIS, ROUTINE W REFLEX MICROSCOPIC
Bilirubin Urine: NEGATIVE
GLUCOSE, UA: NEGATIVE mg/dL
Hgb urine dipstick: NEGATIVE
KETONES UR: NEGATIVE mg/dL
NITRITE: NEGATIVE
PROTEIN: NEGATIVE mg/dL
Specific Gravity, Urine: 1.009 (ref 1.005–1.030)
UROBILINOGEN UA: 4 mg/dL — AB (ref 0.0–1.0)
pH: 6 (ref 5.0–8.0)

## 2015-08-15 LAB — POC OCCULT BLOOD, ED: Fecal Occult Bld: NEGATIVE

## 2015-08-15 LAB — URINE MICROSCOPIC-ADD ON

## 2015-08-15 LAB — HEPATIC FUNCTION PANEL
ALK PHOS: 100 U/L (ref 38–126)
ALT: 40 U/L (ref 17–63)
AST: 133 U/L — ABNORMAL HIGH (ref 15–41)
Albumin: 3.5 g/dL (ref 3.5–5.0)
BILIRUBIN INDIRECT: 0.8 mg/dL (ref 0.3–0.9)
BILIRUBIN TOTAL: 1.7 mg/dL — AB (ref 0.3–1.2)
Bilirubin, Direct: 0.9 mg/dL — ABNORMAL HIGH (ref 0.1–0.5)
TOTAL PROTEIN: 6.5 g/dL (ref 6.5–8.1)

## 2015-08-15 LAB — PROTIME-INR
INR: 0.99 (ref 0.00–1.49)
Prothrombin Time: 13.3 seconds (ref 11.6–15.2)

## 2015-08-15 LAB — CBG MONITORING, ED: Glucose-Capillary: 96 mg/dL (ref 65–99)

## 2015-08-15 LAB — TROPONIN I: Troponin I: <0.03 ng/mL (ref <0.031)

## 2015-08-15 MED ORDER — MORPHINE SULFATE (PF) 4 MG/ML IV SOLN
6.0000 mg | Freq: Once | INTRAVENOUS | Status: AC
Start: 2015-08-15 — End: 2015-08-15
  Administered 2015-08-15: 6 mg via INTRAVENOUS
  Filled 2015-08-15: qty 2

## 2015-08-15 MED ORDER — LORAZEPAM 1 MG PO TABS: 1.0000 mg | ORAL_TABLET | Freq: Four times a day (QID) | ORAL | Status: AC | PRN

## 2015-08-15 MED ORDER — SODIUM CHLORIDE 0.9 % IV BOLUS (SEPSIS)
500.0000 mL | Freq: Once | INTRAVENOUS | Status: AC
  Administered 2015-08-15: 500 mL via INTRAVENOUS

## 2015-08-15 MED ORDER — METHOCARBAMOL 500 MG PO TABS: 500.0000 mg | ORAL_TABLET | Freq: Four times a day (QID) | ORAL | Status: DC | PRN

## 2015-08-15 MED ORDER — SODIUM CHLORIDE 0.9 % IV SOLN: INTRAVENOUS | Status: DC

## 2015-08-15 MED ORDER — METHOCARBAMOL 1000 MG/10ML IJ SOLN
500.0000 mg | Freq: Four times a day (QID) | INTRAVENOUS | Status: DC | PRN
  Filled 2015-08-15: qty 5

## 2015-08-15 MED ORDER — THIAMINE HCL 100 MG/ML IJ SOLN
100.0000 mg | Freq: Every day | INTRAMUSCULAR | Status: DC
  Filled 2015-08-15 (×5): qty 1

## 2015-08-15 MED ORDER — VITAMIN B-1 100 MG PO TABS
100.0000 mg | ORAL_TABLET | Freq: Every day | ORAL | Status: DC
  Administered 2015-08-16 – 2015-08-20 (×5): 100 mg via ORAL
  Filled 2015-08-15 (×5): qty 1

## 2015-08-15 MED ORDER — PANTOPRAZOLE SODIUM 40 MG IV SOLR
40.0000 mg | Freq: Every day | INTRAVENOUS | Status: DC
  Administered 2015-08-16: 40 mg via INTRAVENOUS
  Filled 2015-08-15 (×3): qty 40

## 2015-08-15 MED ORDER — ADULT MULTIVITAMIN W/MINERALS CH
1.0000 | ORAL_TABLET | Freq: Every day | ORAL | Status: DC
  Administered 2015-08-16 – 2015-08-20 (×5): 1 via ORAL
  Filled 2015-08-15 (×6): qty 1

## 2015-08-15 MED ORDER — MORPHINE SULFATE (PF) 2 MG/ML IV SOLN: 0.5000 mg | INTRAVENOUS | Status: DC | PRN

## 2015-08-15 MED ORDER — LORAZEPAM 2 MG/ML IJ SOLN
1.0000 mg | Freq: Four times a day (QID) | INTRAMUSCULAR | Status: AC | PRN
  Administered 2015-08-16: 1 mg via INTRAVENOUS
  Filled 2015-08-15: qty 1

## 2015-08-15 MED ORDER — ONDANSETRON HCL 4 MG/2ML IJ SOLN
4.0000 mg | Freq: Once | INTRAMUSCULAR | Status: AC
  Administered 2015-08-15: 4 mg via INTRAVENOUS
  Filled 2015-08-15: qty 2

## 2015-08-15 MED ORDER — FENTANYL CITRATE (PF) 100 MCG/2ML IJ SOLN: 50.0000 ug | INTRAMUSCULAR | Status: DC | PRN

## 2015-08-15 MED ORDER — FERROUS SULFATE 325 (65 FE) MG PO TABS
325.0000 mg | ORAL_TABLET | Freq: Every day | ORAL | Status: DC
  Administered 2015-08-16 – 2015-08-20 (×5): 325 mg via ORAL
  Filled 2015-08-15 (×7): qty 1

## 2015-08-15 MED ORDER — SODIUM CHLORIDE 0.9 % IV SOLN
INTRAVENOUS | Status: AC
  Administered 2015-08-15 – 2015-08-16 (×2): via INTRAVENOUS

## 2015-08-15 MED ORDER — FOLIC ACID 1 MG PO TABS
1.0000 mg | ORAL_TABLET | Freq: Every day | ORAL | Status: DC
  Administered 2015-08-16 – 2015-08-20 (×5): 1 mg via ORAL
  Filled 2015-08-15 (×5): qty 1

## 2015-08-15 MED ORDER — HYDROCODONE-ACETAMINOPHEN 5-325 MG PO TABS
1.0000 | ORAL_TABLET | Freq: Four times a day (QID) | ORAL | Status: DC | PRN
  Administered 2015-08-16: 1 via ORAL
  Filled 2015-08-15: qty 1

## 2015-08-15 MED ORDER — POTASSIUM CHLORIDE 10 MEQ/100ML IV SOLN
10.0000 meq | INTRAVENOUS | Status: AC
  Administered 2015-08-16 (×4): 10 meq via INTRAVENOUS
  Filled 2015-08-15 (×4): qty 100

## 2015-08-15 NOTE — ED Notes (Signed)
Bed: WA07 Expected date:  Expected time:  Means of arrival:  Comments: EMS-fall 

## 2015-08-15 NOTE — ED Provider Notes (Signed)
CSN: [id number]     Arrival date & time 08/15/15  1631 History   First MD Initiated Contact with Patient 08/15/15 1712     Chief Complaint  Patient presents with  . Loss of Consciousness     (Consider location/radiation/quality/duration/timing/severity/associated sxs/prior Treatment) HPI   Pt brought in by EMS, pt has been having diarrhea this past week, reports multiple episodes of watery diarrhea a day. He has had 2 episodes of syncope yesterday and today. Just prior to arrival he was walking in the garage became weak and had a syncopal episode with an episode of nausea,vomiting and bowel incontinence. He landed on his left hip and has obvious deformity. He says it is painful and he is unable to lift it. Denies head or neck pain.  The patient reports he now feels fine and that his leg is only uncomfortable. He did not get seen for the diarrhea or previous syncopal episodes. Per family members he is currently back to baseline.  PCP: Wenda Low, MD PMH: GERD, hypertension, hx of prostate cancer, PONV, COPD  Wyatt Stein is a 81 y.o.  male   ROS: The patient denies diaphoresis, fever, headache, weakness (r focal), confusion, change of vision,  dysphagia, aphagia, shortness of breath,  abdominal pains, rash, neck pain, chest pain.   Past Medical History  Diagnosis Date  . GERD (gastroesophageal reflux disease)   . Hypertension   . Cancer [facility]) 2008    prostate, sees dr [name] once a year  . History of blood transfusion 2006  . PONV (postoperative nausea and vomiting) 40 yrs ago    aspiration pneumonia after tooth extraxtion  . COPD (chronic obstructive pulmonary disease) [facility])    Past Surgical History  Procedure Laterality Date  . Back fracture surgery  2006    screws and , back brace prn  . Seed implant for prostate cancer    . Back surgery    . Tonsillectomy    . Esophagogastroduodenoscopy (egd) with propofol N/A 07/01/2013    Procedure: ESOPHAGOGASTRODUODENOSCOPY (EGD)  WITH PROPOFOL;  Surgeon: Garlan Fair, MD;  Location: [facility];  Service: Endoscopy;  Laterality: N/A;  . Colonoscopy with propofol N/A 07/01/2013    Procedure: COLONOSCOPY WITH PROPOFOL;  Surgeon: Garlan Fair, MD;  Location: [facility];  Service: Endoscopy;  Laterality: N/A;   Family History  Problem Relation Age of Onset  . Colon cancer Father   . Heart Problems Mother    Social History  Substance Use Topics  . Smoking status: Current Every Day Smoker -- 0.50 packs/day for 30 years    Types: Cigarettes  . Smokeless tobacco: Never Used  . Alcohol Use: 8.4 oz/week    14 Shots of liquor per week    Review of Systems  10 Systems reviewed and are negative for acute change except as noted in the HPI.     Allergies  Ace inhibitors and Ramipril  Home Medications   Prior to Admission medications   Medication Sig Start Date End Date Taking? Authorizing Provider  cholecalciferol (VITAMIN D) 1000 UNITS tablet Take 1,000 Units by mouth daily.   Yes Historical Provider, MD  [employer] B-1 100 MG tablet Take 100 mg by mouth daily. 07/17/15  Yes Historical Provider, MD  ferrous sulfate 325 (65 FE) MG tablet Take 325 mg by mouth daily with breakfast.   Yes Historical Provider, MD  ibuprofen (ADVIL,MOTRIN) 200 MG tablet Take 200 mg by mouth every 6 (six) hours as needed for pain.  Yes Historical Provider, MD  meloxicam (MOBIC) 15 MG tablet Take 15 mg by mouth daily.   Yes Historical Provider, MD  Multiple Vitamins-Minerals (CENTRUM SILVER ADULT 50+) TABS Take 1 tablet by mouth daily.   Yes Historical Provider, MD  omeprazole (PRILOSEC OTC) 20 MG tablet Take 20 mg by mouth daily as needed (for heartburn).    Yes Historical Provider, MD  LORazepam (ATIVAN) 1 MG tablet Take 1 tablet (1 mg total) by mouth 3 (three) times daily as needed for anxiety or seizure. Patient not taking: Reported on 08/15/2015 07/28/14   Noland Fordyce, PA-C   BP 121/76 mmHg  Pulse 80  Temp(Src) 98.1 F (36.7 C)  (Oral)  Resp 18  SpO2 100% Physical Exam  Constitutional: He is oriented to person, place, and time. He appears well-developed and well-nourished. No distress.  HENT:  Head: Normocephalic and atraumatic.  No signs of intraoral injury.  Eyes: Pupils are equal, round, and reactive to light.  Neck: Normal range of motion. Neck supple. No spinous process tenderness and no muscular tenderness present.  Cardiovascular: Normal rate and regular rhythm.   Pulmonary/Chest: Effort normal and breath sounds normal. He has no decreased breath sounds.  Abdominal: Soft.  Musculoskeletal:       Left hip: He exhibits decreased range of motion, decreased strength, tenderness and bony tenderness. He exhibits no swelling, no crepitus, no deformity and no laceration.  Intact sensation to bilateral feet, with intact pedal pulses. Patient has full range of motion of the right hip. He is unable to range his left hip due to pain.  Neurological: He is alert and oriented to person, place, and time.  Skin: Skin is warm and dry.  Nursing note and vitals reviewed.   ED Course  Procedures (including critical care time) Labs Review Labs Reviewed  BASIC METABOLIC PANEL - Abnormal; Notable for the following:    Sodium 134 (*)    Potassium 3.3 (*)    Chloride 98 (*)    Calcium 7.9 (*)    All other components within normal limits  CBC WITH DIFFERENTIAL/PLATELET - Abnormal; Notable for the following:    WBC 3.9 (*)    RBC 2.65 (*)    Hemoglobin 9.8 (*)    HCT 27.6 (*)    MCV 104.2 (*)    MCH 37.0 (*)    Lymphs Abs 0.4 (*)    All other components within normal limits  PROTIME-INR  TROPONIN I  URINALYSIS, ROUTINE W REFLEX MICROSCOPIC (NOT AT [facility])  OCCULT BLOOD X 1 CARD TO LAB, STOOL  CBG MONITORING, ED  POC OCCULT BLOOD, ED  TYPE AND SCREEN  ABO/RH    Imaging Review Ct Head Wo Contrast  08/15/2015   CLINICAL DATA:  Pt transported by [facility] for syncopal episode and fall approx 1hr ago. Pt reports he was  walking through his garage and felt faint prior to syncope. Patient reports nausea, vomiting and incontinence.  EXAM: CT HEAD WITHOUT CONTRAST  CT CERVICAL SPINE WITHOUT CONTRAST  TECHNIQUE: Multidetector CT imaging of the head and cervical spine was performed following the standard protocol without intravenous contrast. Multiplanar CT image reconstructions of the cervical spine were also generated.  COMPARISON:  03/25/2012  FINDINGS: CT HEAD FINDINGS  There is no intracranial hemorrhage, mass or evidence of acute infarction. There is moderately severe generalized atrophy, worsened from 2013. There is moderate chronic microvascular ischemic change, also worsened. There is no significant extra-axial fluid collection. The calvarium and skullbase are intact.  No  acute intracranial findings are evident.  CT CERVICAL SPINE FINDINGS  The vertebral column, pedicles and facet articulations are intact. There is no evidence of acute fracture. No acute soft tissue abnormalities are evident.  Moderate arthritic changes are evident.  IMPRESSION: 1. Negative for acute intracranial traumatic injury. There is moderately severe generalized atrophy and chronic microvascular disease, worsened from 2013. 2. Negative for acute cervical spine fracture.   Electronically Signed   By: Andreas Newport M.D.   On: 08/15/2015 19:33   Ct Cervical Spine Wo Contrast  08/15/2015   CLINICAL DATA:  Pt transported by [facility] for syncopal episode and fall approx 1hr ago. Pt reports he was walking through his garage and felt faint prior to syncope. Patient reports nausea, vomiting and incontinence.  EXAM: CT HEAD WITHOUT CONTRAST  CT CERVICAL SPINE WITHOUT CONTRAST  TECHNIQUE: Multidetector CT imaging of the head and cervical spine was performed following the standard protocol without intravenous contrast. Multiplanar CT image reconstructions of the cervical spine were also generated.  COMPARISON:  03/25/2012  FINDINGS: CT HEAD FINDINGS  There is no  intracranial hemorrhage, mass or evidence of acute infarction. There is moderately severe generalized atrophy, worsened from 2013. There is moderate chronic microvascular ischemic change, also worsened. There is no significant extra-axial fluid collection. The calvarium and skullbase are intact.  No acute intracranial findings are evident.  CT CERVICAL SPINE FINDINGS  The vertebral column, pedicles and facet articulations are intact. There is no evidence of acute fracture. No acute soft tissue abnormalities are evident.  Moderate arthritic changes are evident.  IMPRESSION: 1. Negative for acute intracranial traumatic injury. There is moderately severe generalized atrophy and chronic microvascular disease, worsened from 2013. 2. Negative for acute cervical spine fracture.   Electronically Signed   By: Andreas Newport M.D.   On: 08/15/2015 19:33   Dg Hip Unilat With Pelvis 2-3 Views Left  08/15/2015   CLINICAL DATA:  Severe hip pain post syncopal fall today while walking in his garage.  EXAM: DG HIP (WITH OR WITHOUT PELVIS) 2-3V LEFT  COMPARISON:  None.  FINDINGS: There is an intertrochanteric left hip fracture with varus angulation. There is moderate displacement of the lesser trochanteric fragment. There is no dislocation. There is no bone lesion or bony destruction to suggest a pathologic basis for the fracture. Incidentally noted prostatic seed implants. Incompletely imaged lower lumbar spinal fixation hardware.  IMPRESSION: Intertrochanteric left hip fracture   Electronically Signed   By: Andreas Newport M.D.   On: 08/15/2015 19:19   I have personally reviewed and evaluated these images and lab results as part of my medical decision-making.   EKG Interpretation None      MDM   Final diagnoses:  Closed left hip fracture, initial encounter (HCC)  Diarrhea, unspecified type  Syncope and collapse    6pm- Dr. Wilson Singer has seen patient as well.  Patient has sustained a  IMPRESSION: Intertrochanteric left hip fracture Head and neck CT scan are unremarkable.  CBC shows hemoglobin of 9.8 BMP shows some mild dehydration.   7: 52 pm I spoke with Dr. Ninfa Linden who will consult on patient regarding the hip fracture (left)  8: 39 pm Dr. Darnell Level has agreed to admit patient. It is unclear if Dr. Ninfa Linden will see the patient tonight and he will advise regarding status for surgery. Hospitalist will place admission orders.  Filed Vitals:   08/15/15 1946  BP: 121/76  Pulse: 80  Temp:   Resp: 18   Family has  been update, aware of fracture and need for admission.   Delos Haring, PA-C 08/15/15 2041  Virgel Manifold, MD 08/18/15 [phone]

## 2015-08-15 NOTE — ED Notes (Signed)
Patient transported to CT 

## 2015-08-15 NOTE — ED Notes (Addendum)
Pt transported by [facility] for syncopal episode and fall approx 1hr ago.  Pt reports he was walking through his garage and felt faint prior to syncope.  Pt fell onto left hip and has obvious shortening and rotation to left leg.  Pt has had diarrhea x 2 days and decreased PO intake x 4 days.  Pt had episode of nausea and vomiting and was incontinent of stool during syncope. Pt now awake, alert, responding appropriately.

## 2015-08-15 NOTE — ED Notes (Signed)
Pt cannot use restroom at this time, aware specimen is needed, urinal provided

## 2015-08-15 NOTE — Consult Note (Signed)
Reason for Consult:  Left hip fracture Referring Physician: EDP  Wyatt Stein is an 81 y.o. male.  HPI:   81 yo male brought to the ED via EMS after sustaining a mechanical fall and injuring his left hip.  Apparently had a syncopal episode.  He landed directly on his left hip and is found to have a displaced left intertrochanteric hip fracture.  He reports several syncopal episodes occuring this year.  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  He reports significant left hip pain.  His family is at the bedside.  Past Medical History  Diagnosis Date  . GERD (gastroesophageal reflux disease)   . Hypertension   . Cancer [facility]) 2008    prostate, sees dr Brunilda Payor once a year  . History of blood transfusion 2006  . PONV (postoperative nausea and vomiting) 40 yrs ago    aspiration pneumonia after tooth extraxtion  . COPD (chronic obstructive pulmonary disease) [facility])     Past Surgical History  Procedure Laterality Date  . Back fracture surgery  2006    screws and , back brace prn  . Seed implant for prostate cancer    . Back surgery    . Tonsillectomy    . Esophagogastroduodenoscopy (egd) with propofol N/A 07/01/2013    Procedure: ESOPHAGOGASTRODUODENOSCOPY (EGD) WITH PROPOFOL;  Surgeon: Charolett Bumpers, MD;  Location: [facility];  Service: Endoscopy;  Laterality: N/A;  . Colonoscopy with propofol N/A 07/01/2013    Procedure: COLONOSCOPY WITH PROPOFOL;  Surgeon: Charolett Bumpers, MD;  Location: [facility];  Service: Endoscopy;  Laterality: N/A;    Family History  Problem Relation Age of Onset  . Colon cancer Father   . Heart Problems Mother     Social History:  reports that he has been smoking Cigarettes.  He has a 15 pack-year smoking history. He has never used smokeless tobacco. He reports that he drinks about 8.4 oz of alcohol per week. He reports that he does not use illicit drugs.  Allergies:  Allergies  Allergen Reactions  . Ace  Inhibitors Anaphylaxis and Swelling  . Ramipril Anaphylaxis and Itching    Medications: I have reviewed the patient's current medications.  Results for orders placed or performed during the hospital encounter of 08/15/15 (from the past 48 hour(s))  Basic metabolic panel     Status: Abnormal   Collection Time: 08/15/15  6:00 PM  Result Value Ref Range   Sodium 134 (L) 135 - 145 mmol/L   Potassium 3.3 (L) 3.5 - 5.1 mmol/L   Chloride 98 (L) 101 - 111 mmol/L   CO2 23 22 - 32 mmol/L   Glucose, Bld 97 65 - 99 mg/dL   BUN 12 6 - 20 mg/dL   Creatinine, Ser 3.66 0.61 - 1.24 mg/dL   Calcium 7.9 (L) 8.9 - 10.3 mg/dL   GFR calc non Af Amer >60 >60 mL/min   GFR calc Af Amer >60 >60 mL/min    Comment: (NOTE) The eGFR has been calculated using the CKD EPI equation. This calculation has not been validated in all clinical situations. eGFR's persistently <60 mL/min signify possible Chronic Kidney Disease.    Anion gap 13 5 - 15  CBC WITH DIFFERENTIAL     Status: Abnormal   Collection Time: 08/15/15  6:00 PM  Result Value Ref Range   WBC 3.9 (L) 4.0 - 10.5 K/uL   RBC 2.65 (L) 4.22 - 5.81 MIL/uL  Hemoglobin 9.8 (L) 13.0 - 17.0 g/dL   HCT 27.6 (L) 39.0 - 52.0 %   MCV 104.2 (H) 78.0 - 100.0 fL   MCH 37.0 (H) 26.0 - 34.0 pg   MCHC 35.5 30.0 - 36.0 g/dL   RDW 12.9 11.5 - 15.5 %   Platelets 174 150 - 400 K/uL   Neutrophils Relative % 76 %   Neutro Abs 2.9 1.7 - 7.7 K/uL   Lymphocytes Relative 10 %   Lymphs Abs 0.4 (L) 0.7 - 4.0 K/uL   Monocytes Relative 14 %   Monocytes Absolute 0.6 0.1 - 1.0 K/uL   Eosinophils Relative 0 %   Eosinophils Absolute 0.0 0.0 - 0.7 K/uL   Basophils Relative 0 %   Basophils Absolute 0.0 0.0 - 0.1 K/uL  Protime-INR     Status: None   Collection Time: 08/15/15  6:00 PM  Result Value Ref Range   Prothrombin Time 13.3 11.6 - 15.2 seconds   INR 0.99 0.00 - 1.49  Type and screen     Status: None   Collection Time: 08/15/15  6:00 PM  Result Value Ref Range    ABO/RH(D) B POS    Antibody Screen NEG    Sample Expiration 08/18/2015   Troponin I     Status: None   Collection Time: 08/15/15  6:06 PM  Result Value Ref Range   Troponin I <0.03 <0.031 ng/mL    Comment:        NO INDICATION OF MYOCARDIAL INJURY.   CBG monitoring, ED     Status: None   Collection Time: 08/15/15  6:28 PM  Result Value Ref Range   Glucose-Capillary 96 65 - 99 mg/dL  ABO/Rh     Status: None   Collection Time: 08/15/15  6:33 PM  Result Value Ref Range   ABO/RH(D) B POS   POC occult blood, ED     Status: None   Collection Time: 08/15/15  8:27 PM  Result Value Ref Range   Fecal Occult Bld NEGATIVE NEGATIVE    Ct Head Wo Contrast  08/15/2015   CLINICAL DATA:  Pt transported by [facility] for syncopal episode and fall approx 1hr ago. Pt reports he was walking through his garage and felt faint prior to syncope. Patient reports nausea, vomiting and incontinence.  EXAM: CT HEAD WITHOUT CONTRAST  CT CERVICAL SPINE WITHOUT CONTRAST  TECHNIQUE: Multidetector CT imaging of the head and cervical spine was performed following the standard protocol without intravenous contrast. Multiplanar CT image reconstructions of the cervical spine were also generated.  COMPARISON:  03/25/2012  FINDINGS: CT HEAD FINDINGS  There is no intracranial hemorrhage, mass or evidence of acute infarction. There is moderately severe generalized atrophy, worsened from 2013. There is moderate chronic microvascular ischemic change, also worsened. There is no significant extra-axial fluid collection. The calvarium and skullbase are intact.  No acute intracranial findings are evident.  CT CERVICAL SPINE FINDINGS  The vertebral column, pedicles and facet articulations are intact. There is no evidence of acute fracture. No acute soft tissue abnormalities are evident.  Moderate arthritic changes are evident.  IMPRESSION: 1. Negative for acute intracranial traumatic injury. There is moderately severe generalized atrophy and  chronic microvascular disease, worsened from 2013. 2. Negative for acute cervical spine fracture.   Electronically Signed   By: Andreas Newport M.D.   On: 08/15/2015 19:33   Ct Cervical Spine Wo Contrast  08/15/2015   CLINICAL DATA:  Pt transported by [facility] for syncopal episode and  fall approx 1hr ago. Pt reports he was walking through his garage and felt faint prior to syncope. Patient reports nausea, vomiting and incontinence.  EXAM: CT HEAD WITHOUT CONTRAST  CT CERVICAL SPINE WITHOUT CONTRAST  TECHNIQUE: Multidetector CT imaging of the head and cervical spine was performed following the standard protocol without intravenous contrast. Multiplanar CT image reconstructions of the cervical spine were also generated.  COMPARISON:  03/25/2012  FINDINGS: CT HEAD FINDINGS  There is no intracranial hemorrhage, mass or evidence of acute infarction. There is moderately severe generalized atrophy, worsened from 2013. There is moderate chronic microvascular ischemic change, also worsened. There is no significant extra-axial fluid collection. The calvarium and skullbase are intact.  No acute intracranial findings are evident.  CT CERVICAL SPINE FINDINGS  The vertebral column, pedicles and facet articulations are intact. There is no evidence of acute fracture. No acute soft tissue abnormalities are evident.  Moderate arthritic changes are evident.  IMPRESSION: 1. Negative for acute intracranial traumatic injury. There is moderately severe generalized atrophy and chronic microvascular disease, worsened from 2013. 2. Negative for acute cervical spine fracture.   Electronically Signed   By: Andreas Newport M.D.   On: 08/15/2015 19:33   Dg Hip Unilat With Pelvis 2-3 Views Left  08/15/2015   CLINICAL DATA:  Severe hip pain post syncopal fall today while walking in his garage.  EXAM: DG HIP (WITH OR WITHOUT PELVIS) 2-3V LEFT  COMPARISON:  None.  FINDINGS: There is an intertrochanteric left hip fracture with varus  angulation. There is moderate displacement of the lesser trochanteric fragment. There is no dislocation. There is no bone lesion or bony destruction to suggest a pathologic basis for the fracture. Incidentally noted prostatic seed implants. Incompletely imaged lower lumbar spinal fixation hardware.  IMPRESSION: Intertrochanteric left hip fracture   Electronically Signed   By: Andreas Newport M.D.   On: 08/15/2015 19:19    ROS Blood pressure 121/76, pulse 80, temperature 98.1 F (36.7 C), temperature source Oral, resp. rate 18, SpO2 100 %. Physical Exam  Constitutional: He is oriented to person, place, and time. He appears well-developed and well-nourished.  HENT:  Head: Normocephalic and atraumatic.  Eyes: Pupils are equal, round, and reactive to light.  Neck: Normal range of motion. Neck supple.  Cardiovascular: Normal rate.   Respiratory: Effort normal and breath sounds normal.  GI: Soft. Bowel sounds are normal.  Musculoskeletal:       Left hip: He exhibits decreased range of motion, decreased strength, tenderness, bony tenderness and deformity.  Neurological: He is alert and oriented to person, place, and time.  Skin: Skin is warm and dry.  Psychiatric: He has a normal mood and affect.    Assessment/Plan: Displaced left hip intertrochanteric fracture 1)  This fracture will need surgical fixation.  I shared with he and his family the x-rays and discussed in detail the risks and benefits involved.  General Medicine has been consulted for admission and for medical optimization prior to surgery late tomorrow evening pending clearance.  Dillan Lunden Y 08/15/2015, 9:08 PM

## 2015-08-15 NOTE — H&P (Addendum)
PCP:  Wenda Low, MD  neurology [name] Dr. Benay Spice Referring provider Leith   Chief Complaint: Syncope and collapse  HPI: Wyatt Stein is a 81 y.o. male   has a past medical history of GERD (gastroesophageal reflux disease); Hypertension; Cancer [facility]) (2008); History of blood transfusion (2006); PONV (postoperative nausea and vomiting) (40 yrs ago); and COPD (chronic obstructive pulmonary disease) ([place]).   Presented with patient had   syncopal event yesterday which he did not disclose and then had another episode today patient reports feeling lightheaded prior to falling. The fall was onto left hip resulting in significant pain and inability to walk. EMS was called was noted to have left leg shortening and rotation. He was taken to emergency department and was found to have intertrochanteric left hip fracture. Dr. Rush Farmer of orthopedics have been consulted. The plan is for the cause of syncope to be worked up prior to proceeding to repair.  Of note patient endorses having diarrhea for the past 2 days as well as decreased by mouth intake. Patient also have had some nausea and vomiting during the syncopal event he had an episode of incontinence of stool. He had CT of the neck and head done which was unremarkable. Troponin within normal limits   Patient has known alcohol abuse history drinks about 2-3 shots a day and last Et OH was last night almost 24 hours ago. Denies tremors or hx of withdrawal.   Patient has history of seizure-like activities followed by neurology this episodes have been for past 2 years. She did have had MRI done in October 2014 that showed some atrophy but no evidence of masses or stroke.  Patient has had a last seizure-like activity on 07/29/2015. Patient of note has history of alcohol abuse.  Still reports occasional evens every now and then. He has not been back to neurology.   Hospitalist was called for admission for left hip  fracture resulting from fall due to syncope and patient history of questionable seizure-like activities. Alcohol abuse. Patient is unsure what, seizure he has been having. In the past and has been documented that he has his left arm raised and feels tremulous.  Patient reports poor ambulation secondary to generalized fatigue  Review of Systems:    Pertinent positives include:  abdominal pain, nausea, vomiting, diarrhea, dizziness, syncope  Constitutional:  No weight loss, night sweats, Fevers, chills, fatigue, weight loss  HEENT:  No headaches, Difficulty swallowing,Tooth/dental problems,Sore throat,  No sneezing, itching, ear ache, nasal congestion, post nasal drip,  Cardio-vascular:  No chest pain, Orthopnea, PND, anasarca, palpitations.no Bilateral lower extremity swelling  GI:  No heartburn, indigestion,change in bowel habits, loss of appetite, melena, blood in stool, hematemesis Resp:  no shortness of breath at rest. No dyspnea on exertion, No excess mucus, no productive cough, No non-productive cough, No coughing up of blood.No change in color of mucus.No wheezing. Skin:  no rash or lesions. No jaundice GU:  no dysuria, change in color of urine, no urgency or frequency. No straining to urinate.  No flank pain.  Musculoskeletal:  No joint pain or no joint swelling. No decreased range of motion. No back pain.  Psych:  No change in mood or affect. No depression or anxiety. No memory loss.  Neuro: no localizing neurological complaints, no tingling, no weakness, no double vision, no gait abnormality, no slurred speech, no confusion  Otherwise ROS are negative except for above, 10 systems were reviewed  Past Medical History: Past  Medical History  Diagnosis Date  . GERD (gastroesophageal reflux disease)   . Hypertension   . Cancer [facility]) 2008    prostate, sees dr [name] once a year  . History of blood transfusion 2006  . PONV (postoperative nausea and vomiting) 40 yrs ago     aspiration pneumonia after tooth extraxtion  . COPD (chronic obstructive pulmonary disease) [facility])    Past Surgical History  Procedure Laterality Date  . Back fracture surgery  2006    screws and , back brace prn  . Seed implant for prostate cancer    . Back surgery    . Tonsillectomy    . Esophagogastroduodenoscopy (egd) with propofol N/A 07/01/2013    Procedure: ESOPHAGOGASTRODUODENOSCOPY (EGD) WITH PROPOFOL;  Surgeon: Garlan Fair, MD;  Location: [facility] ENDOSCOPY;  Service: Endoscopy;  Laterality: N/A;  . Colonoscopy with propofol N/A 07/01/2013    Procedure: COLONOSCOPY WITH PROPOFOL;  Surgeon: Garlan Fair, MD;  Location: [facility] ENDOSCOPY;  Service: Endoscopy;  Laterality: N/A;     Medications: Prior to Admission medications   Medication Sig Start Date End Date Taking? Authorizing Provider  cholecalciferol (VITAMIN D) 1000 UNITS tablet Take 1,000 Units by mouth daily.   Yes Historical Provider, MD  [employer] B-1 100 MG tablet Take 100 mg by mouth daily. 07/17/15  Yes Historical Provider, MD  ferrous sulfate 325 (65 FE) MG tablet Take 325 mg by mouth daily with breakfast.   Yes Historical Provider, MD  ibuprofen (ADVIL,MOTRIN) 200 MG tablet Take 200 mg by mouth every 6 (six) hours as needed for pain.   Yes Historical Provider, MD  meloxicam (MOBIC) 15 MG tablet Take 15 mg by mouth daily.   Yes Historical Provider, MD  Multiple Vitamins-Minerals (CENTRUM SILVER ADULT 50+) TABS Take 1 tablet by mouth daily.   Yes Historical Provider, MD  omeprazole (PRILOSEC OTC) 20 MG tablet Take 20 mg by mouth daily as needed (for heartburn).    Yes Historical Provider, MD  LORazepam (ATIVAN) 1 MG tablet Take 1 tablet (1 mg total) by mouth 3 (three) times daily as needed for anxiety or seizure. Patient not taking: Reported on 08/15/2015 07/28/14   Noland Fordyce, PA-C    Allergies:   Allergies  Allergen Reactions  . Ace Inhibitors Anaphylaxis and Swelling  . Ramipril Anaphylaxis and Itching    Social  History:  Ambulatory  Independently  Lives at home  With family     reports that he has been smoking Cigarettes.  He has a 15 pack-year smoking history. He has never used smokeless tobacco. He reports that he drinks about 8.4 oz of alcohol per week. He reports that he does not use illicit drugs.    Family History: family history includes Colon cancer in his father; Heart Problems in his mother.    Physical Exam: Patient Vitals for the past 24 hrs:  BP Temp Temp src Pulse Resp SpO2  08/15/15 1946 121/76 mmHg - - 80 18 100 %  08/15/15 1737 126/79 mmHg 98.1 F (36.7 C) Oral 82 18 100 %    1. General:  in No Acute distress 2. Psychological: Alert and  Oriented 3. Head/ENT:     Dry Mucous Membranes                          Head Non traumatic, neck supple  Normal  Dentition                           icteric conjuctiva 4. SKIN:  decreased Skin turgor,  Skin clean Dry and intact no rash 5. Heart: Regular rate and rhythm no Murmur, Rub or gallop 6. Lungs: Clear to auscultation bilaterally, no wheezes or crackles   7. Abdomen: Soft, non-tender, Non distended 8. Lower extremities: no clubbing, cyanosis, or edema 9. Neurologically Grossly intact, moving all 4 extremities equally 10. MSK: Normal range of motion left lower extremity shortened is evidence of pain limiting full range of motion  body mass index is unknown because there is no weight on file.   Labs on Admission:   Results for orders placed or performed during the hospital encounter of 08/15/15 (from the past 24 hour(s))  Basic metabolic panel     Status: Abnormal   Collection Time: 08/15/15  6:00 PM  Result Value Ref Range   Sodium 134 (L) 135 - 145 mmol/L   Potassium 3.3 (L) 3.5 - 5.1 mmol/L   Chloride 98 (L) 101 - 111 mmol/L   CO2 23 22 - 32 mmol/L   Glucose, Bld 97 65 - 99 mg/dL   BUN 12 6 - 20 mg/dL   Creatinine, Ser 1.12 0.61 - 1.24 mg/dL   Calcium 7.9 (L) 8.9 - 10.3 mg/dL   GFR calc non  Af Amer >60 >60 mL/min   GFR calc Af Amer >60 >60 mL/min   Anion gap 13 5 - 15  CBC WITH DIFFERENTIAL     Status: Abnormal   Collection Time: 08/15/15  6:00 PM  Result Value Ref Range   WBC 3.9 (L) 4.0 - 10.5 K/uL   RBC 2.65 (L) 4.22 - 5.81 MIL/uL   Hemoglobin 9.8 (L) 13.0 - 17.0 g/dL   HCT 27.6 (L) 39.0 - 52.0 %   MCV 104.2 (H) 78.0 - 100.0 fL   MCH 37.0 (H) 26.0 - 34.0 pg   MCHC 35.5 30.0 - 36.0 g/dL   RDW 12.9 11.5 - 15.5 %   Platelets 174 150 - 400 K/uL   Neutrophils Relative % 76 %   Neutro Abs 2.9 1.7 - 7.7 K/uL   Lymphocytes Relative 10 %   Lymphs Abs 0.4 (L) 0.7 - 4.0 K/uL   Monocytes Relative 14 %   Monocytes Absolute 0.6 0.1 - 1.0 K/uL   Eosinophils Relative 0 %   Eosinophils Absolute 0.0 0.0 - 0.7 K/uL   Basophils Relative 0 %   Basophils Absolute 0.0 0.0 - 0.1 K/uL  Protime-INR     Status: None   Collection Time: 08/15/15  6:00 PM  Result Value Ref Range   Prothrombin Time 13.3 11.6 - 15.2 seconds   INR 0.99 0.00 - 1.49  Type and screen     Status: None   Collection Time: 08/15/15  6:00 PM  Result Value Ref Range   ABO/RH(D) B POS    Antibody Screen NEG    Sample Expiration 08/18/2015   Troponin I     Status: None   Collection Time: 08/15/15  6:06 PM  Result Value Ref Range   Troponin I <0.03 <0.031 ng/mL  CBG monitoring, ED     Status: None   Collection Time: 08/15/15  6:28 PM  Result Value Ref Range   Glucose-Capillary 96 65 - 99 mg/dL  ABO/Rh     Status: None   Collection Time: 08/15/15  6:33 PM  Result Value Ref Range   ABO/RH(D) B POS   POC occult blood, ED     Status: None   Collection Time: 08/15/15  8:27 PM  Result Value Ref Range   Fecal Occult Bld NEGATIVE NEGATIVE    UA ordered   No results found for: HGBA1C  CrCl cannot be calculated (Unknown ideal weight.).  BNP (last 3 results) No results for input(s): PROBNP in the last 8760 hours.  Other results:  I have pearsonaly reviewed this: ECG REPORT  Rat83  Rhythm:  sinus rhythm    ST&T Change:  no acute ischemic changes   QTC prolonging of 511  There were no vitals filed for this visit.   Cultures:    Component Value Date/Time   SDES URINE, CLEAN CATCH 10/23/2012 1749   SPECREQUEST NONE 10/23/2012 1749   CULT NO GROWTH 10/23/2012 1749   REPTSTATUS 10/25/2012 FINAL 10/23/2012 1749     Radiological Exams on Admission: Ct Head Wo Contrast  08/15/2015   CLINICAL DATA:  Pt transported by [facility] for syncopal episode and fall approx 1hr ago. Pt reports he was walking through his garage and felt faint prior to syncope. Patient reports nausea, vomiting and incontinence.  EXAM: CT HEAD WITHOUT CONTRAST  CT CERVICAL SPINE WITHOUT CONTRAST  TECHNIQUE: Multidetector CT imaging of the head and cervical spine was performed following the standard protocol without intravenous contrast. Multiplanar CT image reconstructions of the cervical spine were also generated.  COMPARISON:  03/25/2012  FINDINGS: CT HEAD FINDINGS  There is no intracranial hemorrhage, mass or evidence of acute infarction. There is moderately severe generalized atrophy, worsened from 2013. There is moderate chronic microvascular ischemic change, also worsened. There is no significant extra-axial fluid collection. The calvarium and skullbase are intact.  No acute intracranial findings are evident.  CT CERVICAL SPINE FINDINGS  The vertebral column, pedicles and facet articulations are intact. There is no evidence of acute fracture. No acute soft tissue abnormalities are evident.  Moderate arthritic changes are evident.  IMPRESSION: 1. Negative for acute intracranial traumatic injury. There is moderately severe generalized atrophy and chronic microvascular disease, worsened from 2013. 2. Negative for acute cervical spine fracture.   Electronically Signed   By: Andreas Newport M.D.   On: 08/15/2015 19:33   Ct Cervical Spine Wo Contrast  08/15/2015   CLINICAL DATA:  Pt transported by [facility] for syncopal episode and fall  approx 1hr ago. Pt reports he was walking through his garage and felt faint prior to syncope. Patient reports nausea, vomiting and incontinence.  EXAM: CT HEAD WITHOUT CONTRAST  CT CERVICAL SPINE WITHOUT CONTRAST  TECHNIQUE: Multidetector CT imaging of the head and cervical spine was performed following the standard protocol without intravenous contrast. Multiplanar CT image reconstructions of the cervical spine were also generated.  COMPARISON:  03/25/2012  FINDINGS: CT HEAD FINDINGS  There is no intracranial hemorrhage, mass or evidence of acute infarction. There is moderately severe generalized atrophy, worsened from 2013. There is moderate chronic microvascular ischemic change, also worsened. There is no significant extra-axial fluid collection. The calvarium and skullbase are intact.  No acute intracranial findings are evident.  CT CERVICAL SPINE FINDINGS  The vertebral column, pedicles and facet articulations are intact. There is no evidence of acute fracture. No acute soft tissue abnormalities are evident.  Moderate arthritic changes are evident.  IMPRESSION: 1. Negative for acute intracranial traumatic injury. There is moderately severe generalized atrophy and chronic microvascular disease, worsened from 2013. 2. Negative for acute cervical  spine fracture.   Electronically Signed   By: Andreas Newport M.D.   On: 08/15/2015 19:33   Dg Hip Unilat With Pelvis 2-3 Views Left  08/15/2015   CLINICAL DATA:  Severe hip pain post syncopal fall today while walking in his garage.  EXAM: DG HIP (WITH OR WITHOUT PELVIS) 2-3V LEFT  COMPARISON:  None.  FINDINGS: There is an intertrochanteric left hip fracture with varus angulation. There is moderate displacement of the lesser trochanteric fragment. There is no dislocation. There is no bone lesion or bony destruction to suggest a pathologic basis for the fracture. Incidentally noted prostatic seed implants. Incompletely imaged lower lumbar spinal fixation hardware.   IMPRESSION: Intertrochanteric left hip fracture   Electronically Signed   By: Andreas Newport M.D.   On: 08/15/2015 19:19    Chart has been reviewed  Family   at  Bedside    Assessment/Pla  81 year old gentleman history of alcohol abuse prostate cancer, seizure like activity in the past presents with 2 days of diarrhea and decreased by mouth intake followed by syncopal event resulting in fall onto left hip and left hip fracture  Present on Admission:  Left hip fracture - treatment as per orthopedics. Will undergo syncopal workup. Will obtain echogram cycle cardiac enzymes and serial EKG. Monitor QT. Patient is at least moderate risk for operative intervention but given ongoing diarrhea dehydration syncope would probably benefit from optimization prior to proceeding to OR  We will have syncopal workup  . Syncope and collapse - will need further syncopal workup including cycling cardiac enzymes monitoring and telemetry obtain echogram and carotid Dopplers may benefit from EEG given history of seizure like events in the past although currently history most suggestive of syncope  . Anemia will check an anemia panel and Hemoccults stool  . Hypokalemia will replace and check magnesium level  . Dehydration administer IV fluids  . Diarrhea - obtain stool cultures solution nausea and vomiting likely viral gastroenteritis  . QT prolongation monitor on telemetry obtain echogram recheck EKG in the morning Alcohol abuse - CIWA protocol. Monitor on tele  ? Seizure hx will need to obtain EEG and will benefit from Neurology consult given frequent episodes. Patient actually thinks that his last episode was yesterday. Patient sometimes able to ambulate urinary event unsure if true seizures  Prophylaxis: SCD   CODE STATUS:  FULL CODE  as per patient    Disposition:    likely will need placement for rehabilitation                        Other plan as per orders.  I have spent a total of 65 min on  this admission extra time was spent to discuss case with neurology  [place] 08/15/2015, 8:54 PM  [facility]  Pager [phone]   after 2 AM please page floor coverage PA If 7AM-7PM, please contact the day team taking care of the patient  [email or web address]  Password [email or web address]

## 2015-08-16 ENCOUNTER — Inpatient Hospital Stay (HOSPITAL_COMMUNITY): Payer: Medicare Other | Admitting: Anesthesiology

## 2015-08-16 ENCOUNTER — Encounter (HOSPITAL_COMMUNITY)
Admission: EM | Disposition: A | Discharge status: Skilled Nursing Facility | Payer: Self-pay | Source: Home / Self Care | Attending: Internal Medicine

## 2015-08-16 ENCOUNTER — Encounter (HOSPITAL_COMMUNITY): Payer: Self-pay | Admitting: Anesthesiology

## 2015-08-16 ENCOUNTER — Inpatient Hospital Stay (HOSPITAL_COMMUNITY): Payer: Medicare Other

## 2015-08-16 ENCOUNTER — Inpatient Hospital Stay (HOSPITAL_COMMUNITY)
Admit: 2015-08-16 | Discharge: 2015-08-16 | Disposition: A | Discharge status: Home / Self Care | Payer: Medicare Other | Attending: Internal Medicine | Admitting: Internal Medicine

## 2015-08-16 DIAGNOSIS — R55 Syncope and collapse: Secondary | ICD-10-CM

## 2015-08-16 DIAGNOSIS — R0600 Dyspnea, unspecified: Secondary | ICD-10-CM

## 2015-08-16 HISTORY — PX: INTRAMEDULLARY (IM) NAIL INTERTROCHANTERIC: SHX5875

## 2015-08-16 LAB — COMPREHENSIVE METABOLIC PANEL
ALT: 36 U/L (ref 17–63)
AST: 91 U/L — AB (ref 15–41)
Albumin: 3.1 g/dL — ABNORMAL LOW (ref 3.5–5.0)
Alkaline Phosphatase: 98 U/L (ref 38–126)
Anion gap: 6 (ref 5–15)
BUN: 12 mg/dL (ref 6–20)
CHLORIDE: 106 mmol/L (ref 101–111)
CO2: 23 mmol/L (ref 22–32)
CREATININE: 0.95 mg/dL (ref 0.61–1.24)
Calcium: 7.6 mg/dL — ABNORMAL LOW (ref 8.9–10.3)
GFR calc Af Amer: >60 mL/min (ref >60)
GFR calc non Af Amer: >60 mL/min (ref >60)
Glucose, Bld: 82 mg/dL (ref 65–99)
POTASSIUM: 4.2 mmol/L (ref 3.5–5.1)
SODIUM: 135 mmol/L (ref 135–145)
Total Bilirubin: 1.9 mg/dL — ABNORMAL HIGH (ref 0.3–1.2)
Total Protein: 6 g/dL — ABNORMAL LOW (ref 6.5–8.1)

## 2015-08-16 LAB — SURGICAL PCR SCREEN
MRSA, PCR: NEGATIVE
STAPHYLOCOCCUS AUREUS: NEGATIVE

## 2015-08-16 LAB — CBC
HEMATOCRIT: 24.2 % — AB (ref 39.0–52.0)
Hemoglobin: 8.6 g/dL — ABNORMAL LOW (ref 13.0–17.0)
MCH: 37.6 pg — AB (ref 26.0–34.0)
MCHC: 35.5 g/dL (ref 30.0–36.0)
MCV: 105.7 fL — AB (ref 78.0–100.0)
PLATELETS: 167 10*3/uL (ref 150–400)
RBC: 2.29 MIL/uL — AB (ref 4.22–5.81)
RDW: 12.9 % (ref 11.5–15.5)
WBC: 4.3 10*3/uL (ref 4.0–10.5)

## 2015-08-16 LAB — IRON AND TIBC
IRON: 92 ug/dL (ref 45–182)
Saturation Ratios: 51 % — ABNORMAL HIGH (ref 17.9–39.5)
TIBC: 182 ug/dL — ABNORMAL LOW (ref 250–450)
UIBC: 90 ug/dL

## 2015-08-16 LAB — TROPONIN I
Troponin I: <0.03 ng/mL (ref <0.031)
Troponin I: <0.03 ng/mL (ref <0.031)

## 2015-08-16 LAB — RETICULOCYTES
RBC.: 2.29 MIL/uL — AB (ref 4.22–5.81)
RETIC COUNT ABSOLUTE: 52.7 10*3/uL (ref 19.0–186.0)
Retic Ct Pct: 2.3 % (ref 0.4–3.1)

## 2015-08-16 LAB — PREPARE RBC (CROSSMATCH)

## 2015-08-16 LAB — FOLATE: FOLATE: 8.8 ng/mL (ref >5.9)

## 2015-08-16 LAB — FERRITIN: Ferritin: 1382 ng/mL — ABNORMAL HIGH (ref 24–336)

## 2015-08-16 LAB — VITAMIN B12: VITAMIN B 12: 458 pg/mL (ref 180–914)

## 2015-08-16 SURGERY — FIXATION, FRACTURE, INTERTROCHANTERIC, WITH INTRAMEDULLARY ROD
Anesthesia: General | Site: Hip | Laterality: Left

## 2015-08-16 MED ORDER — LEVETIRACETAM 500 MG PO TABS
500.0000 mg | ORAL_TABLET | Freq: Two times a day (BID) | ORAL | Status: DC
  Administered 2015-08-17 – 2015-08-20 (×7): 500 mg via ORAL
  Filled 2015-08-16 (×9): qty 1

## 2015-08-16 MED ORDER — LACTATED RINGERS IV SOLN
INTRAVENOUS | Status: DC | PRN
  Administered 2015-08-16: 20:00:00 via INTRAVENOUS

## 2015-08-16 MED ORDER — HYDROCODONE-ACETAMINOPHEN 5-325 MG PO TABS
1.0000 | ORAL_TABLET | Freq: Four times a day (QID) | ORAL | Status: DC | PRN
  Administered 2015-08-17 – 2015-08-18 (×2): 2 via ORAL
  Filled 2015-08-16 (×2): qty 2

## 2015-08-16 MED ORDER — FENTANYL CITRATE (PF) 100 MCG/2ML IJ SOLN
INTRAMUSCULAR | Status: AC
  Administered 2015-08-16: 25 ug via INTRAVENOUS
  Filled 2015-08-16: qty 2

## 2015-08-16 MED ORDER — 0.9 % SODIUM CHLORIDE (POUR BTL) OPTIME
TOPICAL | Status: DC | PRN
  Administered 2015-08-16: 1000 mL

## 2015-08-16 MED ORDER — PROPOFOL 10 MG/ML IV BOLUS
INTRAVENOUS | Status: AC
  Filled 2015-08-16: qty 20

## 2015-08-16 MED ORDER — METOCLOPRAMIDE HCL 10 MG PO TABS: 5.0000 mg | ORAL_TABLET | Freq: Three times a day (TID) | ORAL | Status: DC | PRN

## 2015-08-16 MED ORDER — ACETAMINOPHEN 325 MG PO TABS: 650.0000 mg | ORAL_TABLET | Freq: Four times a day (QID) | ORAL | Status: DC | PRN

## 2015-08-16 MED ORDER — CEFAZOLIN SODIUM-DEXTROSE 2-3 GM-% IV SOLR
2.0000 g | Freq: Once | INTRAVENOUS | Status: AC
  Administered 2015-08-16: 2 g via INTRAVENOUS

## 2015-08-16 MED ORDER — PHENYLEPHRINE HCL 10 MG/ML IJ SOLN
INTRAMUSCULAR | Status: DC | PRN
  Administered 2015-08-16: 80 ug via INTRAVENOUS

## 2015-08-16 MED ORDER — FENTANYL CITRATE (PF) 100 MCG/2ML IJ SOLN
INTRAMUSCULAR | Status: DC | PRN
  Administered 2015-08-16 (×2): 100 ug via INTRAVENOUS

## 2015-08-16 MED ORDER — ASPIRIN EC 325 MG PO TBEC
325.0000 mg | DELAYED_RELEASE_TABLET | Freq: Every day | ORAL | Status: DC
  Administered 2015-08-17 – 2015-08-20 (×4): 325 mg via ORAL
  Filled 2015-08-16 (×6): qty 1

## 2015-08-16 MED ORDER — SODIUM CHLORIDE 0.9 % IV SOLN
INTRAVENOUS | Status: DC
  Administered 2015-08-17 – 2015-08-18 (×3): via INTRAVENOUS

## 2015-08-16 MED ORDER — METOCLOPRAMIDE HCL 5 MG/ML IJ SOLN: 5.0000 mg | Freq: Three times a day (TID) | INTRAMUSCULAR | Status: DC | PRN

## 2015-08-16 MED ORDER — SODIUM CHLORIDE 0.9 % IV SOLN
2.0000 g | Freq: Once | INTRAVENOUS | Status: AC
  Administered 2015-08-16: 2 g via INTRAVENOUS
  Filled 2015-08-16 (×2): qty 20

## 2015-08-16 MED ORDER — ONDANSETRON HCL 4 MG/2ML IJ SOLN: 4.0000 mg | Freq: Four times a day (QID) | INTRAMUSCULAR | Status: DC | PRN

## 2015-08-16 MED ORDER — DEXTROSE 5 % IV SOLN
500.0000 mg | Freq: Four times a day (QID) | INTRAVENOUS | Status: DC | PRN
  Administered 2015-08-17: 500 mg via INTRAVENOUS
  Filled 2015-08-16 (×4): qty 5

## 2015-08-16 MED ORDER — MORPHINE SULFATE (PF) 2 MG/ML IV SOLN: 2.0000 mg | Freq: Once | INTRAVENOUS | Status: DC

## 2015-08-16 MED ORDER — ONDANSETRON HCL 4 MG PO TABS: 4.0000 mg | ORAL_TABLET | Freq: Four times a day (QID) | ORAL | Status: DC | PRN

## 2015-08-16 MED ORDER — PROPOFOL 10 MG/ML IV BOLUS
INTRAVENOUS | Status: DC | PRN
  Administered 2015-08-16: 110 mg via INTRAVENOUS

## 2015-08-16 MED ORDER — FENTANYL CITRATE (PF) 100 MCG/2ML IJ SOLN
INTRAMUSCULAR | Status: AC
  Filled 2015-08-16: qty 4

## 2015-08-16 MED ORDER — METHOCARBAMOL 500 MG PO TABS: 500.0000 mg | ORAL_TABLET | Freq: Four times a day (QID) | ORAL | Status: DC | PRN

## 2015-08-16 MED ORDER — PHENOL 1.4 % MT LIQD
1.0000 | OROMUCOSAL | Status: DC | PRN
  Filled 2015-08-16: qty 177

## 2015-08-16 MED ORDER — MENTHOL 3 MG MT LOZG
1.0000 | LOZENGE | OROMUCOSAL | Status: DC | PRN
  Filled 2015-08-16: qty 9

## 2015-08-16 MED ORDER — SODIUM CHLORIDE 0.9 % IV SOLN
1000.0000 mg | Freq: Once | INTRAVENOUS | Status: AC
  Administered 2015-08-16: 1000 mg via INTRAVENOUS
  Filled 2015-08-16: qty 10

## 2015-08-16 MED ORDER — SUCCINYLCHOLINE CHLORIDE 20 MG/ML IJ SOLN
INTRAMUSCULAR | Status: DC | PRN
  Administered 2015-08-16: 100 mg via INTRAVENOUS

## 2015-08-16 MED ORDER — FENTANYL CITRATE (PF) 100 MCG/2ML IJ SOLN
25.0000 ug | INTRAMUSCULAR | Status: DC | PRN
  Administered 2015-08-16 (×2): 25 ug via INTRAVENOUS

## 2015-08-16 MED ORDER — LIDOCAINE HCL (CARDIAC) 20 MG/ML IV SOLN
INTRAVENOUS | Status: AC
  Filled 2015-08-16: qty 5

## 2015-08-16 MED ORDER — CEFAZOLIN SODIUM-DEXTROSE 2-3 GM-% IV SOLR
INTRAVENOUS | Status: AC
  Filled 2015-08-16: qty 50

## 2015-08-16 MED ORDER — ONDANSETRON HCL 4 MG/2ML IJ SOLN
INTRAMUSCULAR | Status: AC
  Filled 2015-08-16: qty 2

## 2015-08-16 MED ORDER — SODIUM CHLORIDE 0.9 % IV SOLN
INTRAVENOUS | Status: DC | PRN
Start: 2015-08-16 — End: 2015-08-16
  Administered 2015-08-16: 20:00:00 via INTRAVENOUS

## 2015-08-16 MED ORDER — ACETAMINOPHEN 650 MG RE SUPP: 650.0000 mg | Freq: Four times a day (QID) | RECTAL | Status: DC | PRN

## 2015-08-16 MED ORDER — MORPHINE SULFATE (PF) 2 MG/ML IV SOLN
0.5000 mg | INTRAVENOUS | Status: DC | PRN
  Administered 2015-08-19: 0.5 mg via INTRAVENOUS
  Filled 2015-08-16: qty 1

## 2015-08-16 MED ORDER — LIDOCAINE HCL (CARDIAC) 20 MG/ML IV SOLN
INTRAVENOUS | Status: DC | PRN
Start: 2015-08-16 — End: 2015-08-16
  Administered 2015-08-16: 50 mg via INTRAVENOUS

## 2015-08-16 MED ORDER — CEFAZOLIN SODIUM-DEXTROSE 2-3 GM-% IV SOLR
2.0000 g | Freq: Four times a day (QID) | INTRAVENOUS | Status: AC
  Administered 2015-08-17 (×2): 2 g via INTRAVENOUS
  Filled 2015-08-16 (×2): qty 50

## 2015-08-16 SURGICAL SUPPLY — 40 items
BLADE SURG 15 STRL LF DISP TIS (BLADE) ×1 IMPLANT
BLADE SURG 15 STRL SS (BLADE) ×3
BNDG GAUZE ELAST 4 BULKY (GAUZE/BANDAGES/DRESSINGS) ×3 IMPLANT
COVER PERINEAL POST (MISCELLANEOUS) ×3 IMPLANT
COVER SURGICAL LIGHT HANDLE (MISCELLANEOUS) ×3 IMPLANT
DRAPE PROXIMA HALF (DRAPES) IMPLANT
DRAPE STERI IOBAN 125X83 (DRAPES) ×3 IMPLANT
DRSG MEPILEX BORDER 4X4 (GAUZE/BANDAGES/DRESSINGS) ×4 IMPLANT
DRSG MEPILEX BORDER 4X8 (GAUZE/BANDAGES/DRESSINGS) ×1 IMPLANT
DRSG PAD ABDOMINAL 8X10 ST (GAUZE/BANDAGES/DRESSINGS) ×2 IMPLANT
DURAPREP 26ML APPLICATOR (WOUND CARE) ×3 IMPLANT
ELECT REM PT RETURN 9FT ADLT (ELECTROSURGICAL) ×3
ELECTRODE REM PT RTRN 9FT ADLT (ELECTROSURGICAL) ×1 IMPLANT
FACESHIELD WRAPAROUND (MASK) IMPLANT
FACESHIELD WRAPAROUND OR TEAM (MASK) ×1 IMPLANT
GAUZE XEROFORM 5X9 LF (GAUZE/BANDAGES/DRESSINGS) ×1 IMPLANT
GLOVE BIOGEL PI IND STRL 8 (GLOVE) ×2 IMPLANT
GLOVE BIOGEL PI INDICATOR 8 (GLOVE) ×4
GLOVE ECLIPSE 8.0 STRL XLNG CF (GLOVE) ×3 IMPLANT
GLOVE ORTHO TXT STRL SZ7.5 (GLOVE) ×3 IMPLANT
GOWN STRL REUS W/ TWL XL LVL3 (GOWN DISPOSABLE) IMPLANT
GOWN STRL REUS W/TWL LRG LVL3 (GOWN DISPOSABLE) ×5 IMPLANT
GOWN STRL REUS W/TWL XL LVL3 (GOWN DISPOSABLE)
KIT BASIN OR (CUSTOM PROCEDURE TRAY) ×3 IMPLANT
KIT ROOM TURNOVER OR (KITS) ×3 IMPLANT
MANIFOLD NEPTUNE II (INSTRUMENTS) ×1 IMPLANT
NAIL HIP FRA AFFIX 130X9X380 L (Nail) ×2 IMPLANT
NS IRRIG 1000ML POUR BTL (IV SOLUTION) ×3 IMPLANT
PACK GENERAL/GYN (CUSTOM PROCEDURE TRAY) ×3 IMPLANT
PAD ARMBOARD 7.5X6 YLW CONV (MISCELLANEOUS) ×6 IMPLANT
PAD CAST 4YDX4 CTTN HI CHSV (CAST SUPPLIES) ×2 IMPLANT
PADDING CAST COTTON 4X4 STRL (CAST SUPPLIES)
SCREW LAG HIP NAIL 10.5X95 (Screw) ×2 IMPLANT
STAPLER VISISTAT 35W (STAPLE) ×1 IMPLANT
SUT VIC AB 0 CT1 27 (SUTURE) ×6
SUT VIC AB 0 CT1 27XBRD ANBCTR (SUTURE) ×2 IMPLANT
SUT VIC AB 2-0 CT1 27 (SUTURE) ×6
SUT VIC AB 2-0 CT1 TAPERPNT 27 (SUTURE) ×2 IMPLANT
TOWEL OR 17X24 6PK STRL BLUE (TOWEL DISPOSABLE) ×3 IMPLANT
TOWEL OR 17X26 10 PK STRL BLUE (TOWEL DISPOSABLE) ×3 IMPLANT

## 2015-08-16 NOTE — Transfer of Care (Signed)
Immediate Anesthesia Transfer of Care Note  Patient: Wyatt Stein  Procedure(s) Performed: Procedure(s): INTRAMEDULLARY (IM) NAIL INTERTROCHANTRIC (Left)  Patient Location: PACU  Anesthesia Type:General  Level of Consciousness: awake  Airway & Oxygen Therapy: Patient Spontanous Breathing and Patient connected to face mask oxygen  Post-op Assessment: Report given to RN and Post -op Vital signs reviewed and stable  Post vital signs: Reviewed and stable  Last Vitals:  Filed Vitals:   08/16/15 1728  BP: 166/99  Pulse: 94  Temp: 37.6 C  Resp: 20    Complications: No apparent anesthesia complications

## 2015-08-16 NOTE — Progress Notes (Signed)
EEG completed, results pending. 

## 2015-08-16 NOTE — Anesthesia Preprocedure Evaluation (Addendum)
Anesthesia Evaluation  Patient identified by MRN, date of birth, ID band Patient awake    Reviewed: Allergy & Precautions, NPO status , Patient's Chart, lab work & pertinent test results  History of Anesthesia Complications (+) PONV  Airway Mallampati: II  TM Distance: >3 FB Neck ROM: Full    Dental   Pulmonary COPD, Current Smoker,    breath sounds clear to auscultation       Cardiovascular hypertension,  Rhythm:Regular Rate:Normal     Neuro/Psych    GI/Hepatic GERD  ,History noted. CE   Endo/Other  negative endocrine ROS  Renal/GU negative Renal ROS     Musculoskeletal   Abdominal   Peds  Hematology  (+) anemia , Anemia work up noted. CE   Anesthesia Other Findings   Reproductive/Obstetrics                            Anesthesia Physical Anesthesia Plan  ASA: III  Anesthesia Plan: General   Post-op Pain Management:    Induction: Intravenous  Airway Management Planned: Oral ETT  Additional Equipment:   Intra-op Plan:   Post-operative Plan: Possible Post-op intubation/ventilation  Informed Consent: I have reviewed the patients History and Physical, chart, labs and discussed the procedure including the risks, benefits and alternatives for the proposed anesthesia with the patient or authorized representative who has indicated his/her understanding and acceptance.   Dental advisory given  Plan Discussed with: CRNA, Anesthesiologist and Surgeon  Anesthesia Plan Comments:         Anesthesia Quick Evaluation

## 2015-08-16 NOTE — Progress Notes (Signed)
Patient ID: Wyatt Stein, male   DOB: [birth year], 81 y.o.   MRN: [id number] Have placed Wyatt Stein on the OR schedule this evening if clears from a medical standpoint.  Acute on chronic blood loss anemia; will likely need transfusion during this hospitalization.

## 2015-08-16 NOTE — Progress Notes (Signed)
Echocardiogram 2D Echocardiogram has been performed.  Tresa Res 08/16/2015, 10:24 AM

## 2015-08-16 NOTE — Anesthesia Procedure Notes (Signed)
Procedure Name: Intubation Date/Time: 08/16/2015 8:02 PM Performed by: Danley Danker L Patient Re-evaluated:Patient Re-evaluated prior to inductionOxygen Delivery Method: Circle system utilized Preoxygenation: Pre-oxygenation with 100% oxygen Intubation Type: IV induction Ventilation: Mask ventilation without difficulty and Oral airway inserted - appropriate to patient size Laryngoscope Size: Miller and 3 Grade View: Grade I Tube type: Oral Tube size: 7.5 mm Number of attempts: 1 Airway Equipment and Method: Stylet Placement Confirmation: ETT inserted through vocal cords under direct vision,  breath sounds checked- equal and bilateral and positive ETCO2 Secured at: 22 cm Tube secured with: Tape Dental Injury: Teeth and Oropharynx as per pre-operative assessment

## 2015-08-16 NOTE — Consult Note (Signed)
Reason for Consult:Syncope Referring Physician: Daleen Bo  CC: Syncope  HPI: Wyatt Stein is an 81 y.o. male who had a syncopal episode on the day of admission precipitating a hip fracture.  Patient had an episode the day prior and about a week prior as well.  It appears from looking at the records that the patient has been having these episodes for at least the past 4 years.  Frequency is difficult to determine since the patient is not fully forth coming with his history.  He was evaluated by Dr. [name] in 2014 and anticonvulsant therapy was recommended.  The patient declined at that time.  MRI at the time was unremarkable except for atrophy. EEG was unremarkable as well.   Patient has a history of noncompliance.    Past Medical History  Diagnosis Date  . GERD (gastroesophageal reflux disease)   . Hypertension   . Cancer [facility]) 2008    prostate, sees dr [name] once a year  . History of blood transfusion 2006  . PONV (postoperative nausea and vomiting) 40 yrs ago    aspiration pneumonia after tooth extraxtion  . COPD (chronic obstructive pulmonary disease) [facility])     Past Surgical History  Procedure Laterality Date  . Back fracture surgery  2006    screws and , back brace prn  . Seed implant for prostate cancer    . Back surgery    . Tonsillectomy    . Esophagogastroduodenoscopy (egd) with propofol N/A 07/01/2013    Procedure: ESOPHAGOGASTRODUODENOSCOPY (EGD) WITH PROPOFOL;  Surgeon: Garlan Fair, MD;  Location: [facility];  Service: Endoscopy;  Laterality: N/A;  . Colonoscopy with propofol N/A 07/01/2013    Procedure: COLONOSCOPY WITH PROPOFOL;  Surgeon: Garlan Fair, MD;  Location: [facility];  Service: Endoscopy;  Laterality: N/A;    Family History  Problem Relation Age of Onset  . Colon cancer Father   . Heart Problems Mother     Social History:  reports that he has been smoking Cigarettes.  He has a 15 pack-year smoking history. He has never used smokeless tobacco.  He reports that he drinks about 8.4 oz of alcohol per week. He reports that he does not use illicit drugs.  Allergies  Allergen Reactions  . Ace Inhibitors Anaphylaxis and Swelling  . Ramipril Anaphylaxis and Itching    Medications:  I have reviewed the patient's current medications. Prior to Admission:  Prescriptions prior to admission  Medication Sig Dispense Refill Last Dose  . cholecalciferol (VITAMIN D) 1000 UNITS tablet Take 1,000 Units by mouth daily.   Past Week at Unknown time  . CVS B-1 100 MG tablet Take 100 mg by mouth daily.  11 Past Week at Unknown time  . ferrous sulfate 325 (65 FE) MG tablet Take 325 mg by mouth daily with breakfast.   Past Week at Unknown time  . ibuprofen (ADVIL,MOTRIN) 200 MG tablet Take 200 mg by mouth every 6 (six) hours as needed for pain.   Past Month at Unknown time  . meloxicam (MOBIC) 15 MG tablet Take 15 mg by mouth daily.   Past Week at Unknown time  . Multiple Vitamins-Minerals (CENTRUM SILVER ADULT 50+) TABS Take 1 tablet by mouth daily.   Past Week at Unknown time  . omeprazole (PRILOSEC OTC) 20 MG tablet Take 20 mg by mouth daily as needed (for heartburn).    Past Week at Unknown time  . LORazepam (ATIVAN) 1 MG tablet Take 1 tablet (1 mg total) by  mouth 3 (three) times daily as needed for anxiety or seizure. (Patient not taking: Reported on 08/15/2015) 15 tablet 0 Not Taking at Unknown time   Scheduled: . ferrous sulfate  325 mg Oral Q breakfast  . folic acid  1 mg Oral Daily  .  morphine injection  2 mg Intravenous Once  . multivitamin with minerals  1 tablet Oral Daily  . pantoprazole (PROTONIX) IV  40 mg Intravenous QHS  . thiamine  100 mg Oral Daily   Or  . thiamine  100 mg Intravenous Daily    ROS: History obtained from the patient and daughter  General ROS: negative for - chills, fatigue, fever, night sweats, weight gain or weight loss Psychological ROS: depression, memory problems Ophthalmic ROS: negative for - blurry  vision, double vision, eye pain or loss of vision ENT ROS: negative for - epistaxis, nasal discharge, oral lesions, sore throat, tinnitus or vertigo Allergy and Immunology ROS: negative for - hives or itchy/watery eyes Hematological and Lymphatic ROS: negative for - bleeding problems, bruising or swollen lymph nodes Endocrine ROS: negative for - galactorrhea, hair pattern changes, polydipsia/polyuria or temperature intolerance Respiratory ROS: negative for - cough, hemoptysis, shortness of breath or wheezing Cardiovascular ROS: negative for - chest pain, dyspnea on exertion, edema or irregular heartbeat Gastrointestinal ROS: negative for - abdominal pain, diarrhea, hematemesis, nausea/vomiting or stool incontinence Genito-Urinary ROS: negative for - dysuria, hematuria, incontinence or urinary frequency/urgency Musculoskeletal ROS: negative for - joint swelling or muscular weakness Neurological ROS: as noted in HPI Dermatological ROS: negative for rash and skin lesion changes  Physical Examination: Blood pressure 145/79, pulse 88, temperature 98.3 F (36.8 C), temperature source Oral, resp. rate 20, height '5\' 9"'$  (1.753 m), weight 53.5 kg (117 lb 15.1 oz), SpO2 99 %.  Gen - Cachectic HEENT-  Normocephalic, no lesions, without obvious abnormality.  Normal external eye and conjunctiva.  Normal TM's bilaterally.  Normal auditory canals and external ears. Normal external nose, mucus membranes and septum.  Normal pharynx. Cardiovascular- S1, S2 normal, pulses palpable throughout   Lungs- chest clear, no wheezing, rales, normal symmetric air entry Abdomen- soft, non-tender; bowel sounds normal; no masses,  no organomegaly Extremities- no edema Lymph-no adenopathy palpable Musculoskeletal-left hip fracture Skin-warm and dry, no hyperpigmentation, vitiligo, or suspicious lesions  Neurological Examination Mental Status: Alert, oriented, thought content appropriate.  Speech fluent without evidence  of aphasia.  Able to follow 3 step commands without difficulty. Cranial Nerves: II: Discs flat bilaterally; Visual fields grossly normal, pupils equal, round, reactive to light and accommodation III,IV, VI: ptosis not present, extra-ocular motions intact bilaterally V,VII: smile symmetric, facial light touch sensation normal bilaterally VIII: hearing normal bilaterally IX,X: gag reflex present XI: bilateral shoulder shrug XII: midline tongue extension Motor: Right : Upper extremity   5/5    Left:     Upper extremity   5/5  Lower extremity   5/5     Lower extremity   Unable to test due to fracture but able to wiggle toes  Mild low amplitude, high frequency tremor of both upper extremities.   Sensory: Pinprick and light touch intact throughout, bilaterally Deep Tendon Reflexes: 2+ in the upper extremities and absent in the lower extremities.   Plantars: Right: mute   Left: downgoing Cerebellar: normal finger-to-nose testing with tremor Gait: not tested due to fracture   Laboratory Studies:   Basic Metabolic Panel:  Recent Labs Lab 08/15/15 1800 08/16/15 0535  NA 134* 135  K 3.3* 4.2  CL  98* 106  CO2 23 23  GLUCOSE 97 82  BUN 12 12  CREATININE 1.12 0.95  CALCIUM 7.9* 7.6*    Liver Function Tests:  Recent Labs Lab 08/15/15 2139 08/16/15 0535  AST 133* 91*  ALT 40 36  ALKPHOS 100 98  BILITOT 1.7* 1.9*  PROT 6.5 6.0*  ALBUMIN 3.5 3.1*   No results for input(s): LIPASE, AMYLASE in the last 168 hours. No results for input(s): AMMONIA in the last 168 hours.  CBC:  Recent Labs Lab 08/15/15 1800 08/16/15 0535  WBC 3.9* 4.3  NEUTROABS 2.9  --   HGB 9.8* 8.6*  HCT 27.6* 24.2*  MCV 104.2* 105.7*  PLT 174 167    Cardiac Enzymes:  Recent Labs Lab 08/15/15 1806 08/16/15 0008 08/16/15 0535 08/16/15 1200  TROPONINI <0.03 <0.03 <0.03 <0.03    BNP: Invalid input(s): POCBNP  CBG:  Recent Labs Lab 08/15/15 1828  GLUCAP 69    Microbiology: Results  for orders placed or performed during the hospital encounter of 08/15/15  Surgical pcr screen     Status: None   Collection Time: 08/16/15  7:30 AM  Result Value Ref Range Status   MRSA, PCR NEGATIVE NEGATIVE Final   Staphylococcus aureus NEGATIVE NEGATIVE Final    Comment:        The Xpert SA Assay (FDA approved for NASAL specimens in patients over 15 years of age), is one component of a comprehensive surveillance program.  Test performance has been validated by [facility] for patients greater than or equal to 40 year old. It is not intended to diagnose infection nor to guide or monitor treatment.     Coagulation Studies:  Recent Labs  08/15/15 1800  LABPROT 13.3  INR 0.99    Urinalysis:  Recent Labs Lab 08/15/15 2102  COLORURINE YELLOW  LABSPEC 1.009  PHURINE 6.0  GLUCOSEU NEGATIVE  HGBUR NEGATIVE  BILIRUBINUR NEGATIVE  KETONESUR NEGATIVE  PROTEINUR NEGATIVE  UROBILINOGEN 4.0*  NITRITE NEGATIVE  LEUKOCYTESUR TRACE*    Lipid Panel:  No results found for: CHOL, TRIG, HDL, CHOLHDL, VLDL, LDLCALC  HgbA1C: No results found for: HGBA1C  Urine Drug Screen:  No results found for: LABOPIA, COCAINSCRNUR, LABBENZ, AMPHETMU, THCU, LABBARB  Alcohol Level: No results for input(s): ETH in the last 168 hours.  Other results: EKG: sinus rhythm at 83 bpm.  Imaging: Dg Chest 1 View  08/15/2015   CLINICAL DATA:  Fall, hip fracture, preoperative evaluation. History of cancer, COPD.  EXAM: CHEST 1 VIEW  COMPARISON:  Chest radiograph October 23, 2012  FINDINGS: Cardiomediastinal silhouette normal. Mildly increased lung volumes compatible with history of COPD without pleural effusion or focal consolidation. No pneumothorax. Patient is osteopenic. Thoracolumbar hardware partially imaged.  IMPRESSION: COPD, no superimposed acute cardiopulmonary process.   Electronically Signed   By: Elon Alas M.D.   On: 08/15/2015 22:36   Ct Head Wo Contrast  08/15/2015   CLINICAL  DATA:  Pt transported by [facility] for syncopal episode and fall approx 1hr ago. Pt reports he was walking through his garage and felt faint prior to syncope. Patient reports nausea, vomiting and incontinence.  EXAM: CT HEAD WITHOUT CONTRAST  CT CERVICAL SPINE WITHOUT CONTRAST  TECHNIQUE: Multidetector CT imaging of the head and cervical spine was performed following the standard protocol without intravenous contrast. Multiplanar CT image reconstructions of the cervical spine were also generated.  COMPARISON:  03/25/2012  FINDINGS: CT HEAD FINDINGS  There is no intracranial hemorrhage, mass or evidence of acute  infarction. There is moderately severe generalized atrophy, worsened from 2013. There is moderate chronic microvascular ischemic change, also worsened. There is no significant extra-axial fluid collection. The calvarium and skullbase are intact.  No acute intracranial findings are evident.  CT CERVICAL SPINE FINDINGS  The vertebral column, pedicles and facet articulations are intact. There is no evidence of acute fracture. No acute soft tissue abnormalities are evident.  Moderate arthritic changes are evident.  IMPRESSION: 1. Negative for acute intracranial traumatic injury. There is moderately severe generalized atrophy and chronic microvascular disease, worsened from 2013. 2. Negative for acute cervical spine fracture.   Electronically Signed   By: Andreas Newport M.D.   On: 08/15/2015 19:33   Ct Cervical Spine Wo Contrast  08/15/2015   CLINICAL DATA:  Pt transported by [facility] for syncopal episode and fall approx 1hr ago. Pt reports he was walking through his garage and felt faint prior to syncope. Patient reports nausea, vomiting and incontinence.  EXAM: CT HEAD WITHOUT CONTRAST  CT CERVICAL SPINE WITHOUT CONTRAST  TECHNIQUE: Multidetector CT imaging of the head and cervical spine was performed following the standard protocol without intravenous contrast. Multiplanar CT image reconstructions of the  cervical spine were also generated.  COMPARISON:  03/25/2012  FINDINGS: CT HEAD FINDINGS  There is no intracranial hemorrhage, mass or evidence of acute infarction. There is moderately severe generalized atrophy, worsened from 2013. There is moderate chronic microvascular ischemic change, also worsened. There is no significant extra-axial fluid collection. The calvarium and skullbase are intact.  No acute intracranial findings are evident.  CT CERVICAL SPINE FINDINGS  The vertebral column, pedicles and facet articulations are intact. There is no evidence of acute fracture. No acute soft tissue abnormalities are evident.  Moderate arthritic changes are evident.  IMPRESSION: 1. Negative for acute intracranial traumatic injury. There is moderately severe generalized atrophy and chronic microvascular disease, worsened from 2013. 2. Negative for acute cervical spine fracture.   Electronically Signed   By: Andreas Newport M.D.   On: 08/15/2015 19:33   Dg Hip Unilat With Pelvis 2-3 Views Left  08/15/2015   CLINICAL DATA:  Severe hip pain post syncopal fall today while walking in his garage.  EXAM: DG HIP (WITH OR WITHOUT PELVIS) 2-3V LEFT  COMPARISON:  None.  FINDINGS: There is an intertrochanteric left hip fracture with varus angulation. There is moderate displacement of the lesser trochanteric fragment. There is no dislocation. There is no bone lesion or bony destruction to suggest a pathologic basis for the fracture. Incidentally noted prostatic seed implants. Incompletely imaged lower lumbar spinal fixation hardware.  IMPRESSION: Intertrochanteric left hip fracture   Electronically Signed   By: Andreas Newport M.D.   On: 08/15/2015 19:19     Assessment/Plan: 81 year old male presenting with episodes of syncope that have been present for at least the past 4 years.  Described as left arm extending and shaking and associated incontinence at times.  Based on history unclear if related to ETOH withdrawal.  MRI  and EEG in the past have been unremarkable.  At this time patient is open to starting antiepileptic therapy.    Recommendations: 1.  Keppra '1000mg'$  IV with maintenance of '500mg'$  BID 2.  Patient unable to drive, operate heavy machinery, perform activities at heights and participate in water activities until release by outpatient physician. 3.  Patient to follow up with [facility] after discharge on an outpatient basis.   4.  Seizure precautions 5.  Agree with CIWA  Alexis Goodell, MD [facility] [phone] 08/16/2015, 1:31 PM

## 2015-08-16 NOTE — Brief Op Note (Signed)
08/15/2015 - 08/16/2015  9:01 PM  PATIENT:  Wyatt Stein  81 y.o. male  PRE-OPERATIVE DIAGNOSIS:  left intertrochanteric hip fracture  POST-OPERATIVE DIAGNOSIS:  left intertrochanteric hip fracture  PROCEDURE:  Procedure(s): INTRAMEDULLARY (IM) NAIL INTERTROCHANTRIC (Left)  SURGEON:  Surgeon(s) and Role:    * Mcarthur Rossetti, MD - Primary  ANESTHESIA:   general  EBL:  Total I/O In: 52 [I.V.:150; Blood:335] Out: 400 [Urine:300; Blood:100]  BLOOD ADMINISTERED:500 CC PRBC  DRAINS: none   LOCAL MEDICATIONS USED:  NONE  SPECIMEN:  No Specimen  DISPOSITION OF SPECIMEN:  N/A  COUNTS:  YES  TOURNIQUET:  * No tourniquets in log *  DICTATION: .Other Dictation: Dictation Number [id number]  PLAN OF CARE: Admit to inpatient   PATIENT DISPOSITION:  PACU - hemodynamically stable.   Delay start of Pharmacological VTE agent (>24hrs) due to surgical blood loss or risk of bleeding: no

## 2015-08-16 NOTE — Progress Notes (Signed)
[facility] PROGRESS NOTE  FREMAN Stein BJY:[id number] DOB: [birth year] DOA: 08/15/2015 PCP: Wenda Low, MD  Assessment/Plan: 81 y/o male with PMH of HTN, COPD, Chronic Tobacco use, Alcoholism presented with presyncope and fall and L hip fracture. Patient denied syncope/loss of consciousness to me.  He reported having poor oral intake for a while   1.  Presyncope, dehydration likely due to poor oral intake in the setting of chronic alcoholism. neuro exam is non focal. CT head: brain atrophy likely due to alcohol. No acute cardiopulmonary symptoms. Repeat ECG: NSR. Troponins: negative. Patient is scheduled for surgery pend echo. Patient should be able to go for surgery today if echo is unremarkable. Cont IVF. Monitor closely due to risk of DTs. Patient may have had a seizure. We will f/u EEG. neurology eval    2. Alcoholism. Patient reports h/o withdrawals. But no seizures. Cont monitor on CIWA. Counseled to cut alcohol use  3. COPD.chronic tobacco use. No wheezing. CXR: no clear infiltrates. Cont prb bronchodilators. Counseled to stop smoking.  4. H/o HTN. Patient is not on anymeds. Cont monitor  5. Anemia. Likely alcoholic. No s/s of acute bleeding. Check folate, b12. Replace. TF PRBC post op as needed  Code Status: Full Family Communication: d/w patient (indicate person spoken with, relationship, and if by phone, the number) Disposition Plan: pend surgery    Consultants:  Ortho   Procedures:  Pend echo   Antibiotics:  noen (indicate start date, and stop date if known)  HPI/Subjective: Alert, orineted   Objective: Filed Vitals:   08/16/15 0558  BP: 145/79  Pulse: 88  Temp: 98.3 F (36.8 C)  Resp: 20   No intake or output data in the 24 hours ending 08/16/15 0831 Filed Weights   08/15/15 2303  Weight: 53.5 kg (117 lb 15.1 oz)    Exam:   General:  Alert. No distress   Cardiovascular: s1,s2 rrr  Respiratory: CTA BL  Abdomen: soft, nt,nd    Musculoskeletal: no leg edema   Data Reviewed: Basic Metabolic Panel:  Recent Labs Lab 08/15/15 1800 08/16/15 0535  NA 134* 135  K 3.3* 4.2  CL 98* 106  CO2 23 23  GLUCOSE 97 82  BUN 12 12  CREATININE 1.12 0.95  CALCIUM 7.9* 7.6*   Liver Function Tests:  Recent Labs Lab 08/15/15 2139 08/16/15 0535  AST 133* 91*  ALT 40 36  ALKPHOS 100 98  BILITOT 1.7* 1.9*  PROT 6.5 6.0*  ALBUMIN 3.5 3.1*   No results for input(s): LIPASE, AMYLASE in the last 168 hours. No results for input(s): AMMONIA in the last 168 hours. CBC:  Recent Labs Lab 08/15/15 1800 08/16/15 0535  WBC 3.9* 4.3  NEUTROABS 2.9  --   HGB 9.8* 8.6*  HCT 27.6* 24.2*  MCV 104.2* 105.7*  PLT 174 167   Cardiac Enzymes:  Recent Labs Lab 08/15/15 1806 08/16/15 0008 08/16/15 0535  TROPONINI <0.03 <0.03 <0.03   BNP (last 3 results) No results for input(s): BNP in the last 8760 hours.  ProBNP (last 3 results) No results for input(s): PROBNP in the last 8760 hours.  CBG:  Recent Labs Lab 08/15/15 1828  GLUCAP 96    No results found for this or any previous visit (from the past 240 hour(s)).   Studies: Dg Chest 1 View  08/15/2015   CLINICAL DATA:  Fall, hip fracture, preoperative evaluation. History of cancer, COPD.  EXAM: CHEST 1 VIEW  COMPARISON:  Chest radiograph October 23, 2012  FINDINGS:  Cardiomediastinal silhouette normal. Mildly increased lung volumes compatible with history of COPD without pleural effusion or focal consolidation. No pneumothorax. Patient is osteopenic. Thoracolumbar hardware partially imaged.  IMPRESSION: COPD, no superimposed acute cardiopulmonary process.   Electronically Signed   By: Elon Alas M.D.   On: 08/15/2015 22:36   Ct Head Wo Contrast  08/15/2015   CLINICAL DATA:  Pt transported by [facility] for syncopal episode and fall approx 1hr ago. Pt reports he was walking through his garage and felt faint prior to syncope. Patient reports nausea, vomiting and  incontinence.  EXAM: CT HEAD WITHOUT CONTRAST  CT CERVICAL SPINE WITHOUT CONTRAST  TECHNIQUE: Multidetector CT imaging of the head and cervical spine was performed following the standard protocol without intravenous contrast. Multiplanar CT image reconstructions of the cervical spine were also generated.  COMPARISON:  03/25/2012  FINDINGS: CT HEAD FINDINGS  There is no intracranial hemorrhage, mass or evidence of acute infarction. There is moderately severe generalized atrophy, worsened from 2013. There is moderate chronic microvascular ischemic change, also worsened. There is no significant extra-axial fluid collection. The calvarium and skullbase are intact.  No acute intracranial findings are evident.  CT CERVICAL SPINE FINDINGS  The vertebral column, pedicles and facet articulations are intact. There is no evidence of acute fracture. No acute soft tissue abnormalities are evident.  Moderate arthritic changes are evident.  IMPRESSION: 1. Negative for acute intracranial traumatic injury. There is moderately severe generalized atrophy and chronic microvascular disease, worsened from 2013. 2. Negative for acute cervical spine fracture.   Electronically Signed   By: Andreas Newport M.D.   On: 08/15/2015 19:33   Ct Cervical Spine Wo Contrast  08/15/2015   CLINICAL DATA:  Pt transported by [facility] for syncopal episode and fall approx 1hr ago. Pt reports he was walking through his garage and felt faint prior to syncope. Patient reports nausea, vomiting and incontinence.  EXAM: CT HEAD WITHOUT CONTRAST  CT CERVICAL SPINE WITHOUT CONTRAST  TECHNIQUE: Multidetector CT imaging of the head and cervical spine was performed following the standard protocol without intravenous contrast. Multiplanar CT image reconstructions of the cervical spine were also generated.  COMPARISON:  03/25/2012  FINDINGS: CT HEAD FINDINGS  There is no intracranial hemorrhage, mass or evidence of acute infarction. There is moderately severe  generalized atrophy, worsened from 2013. There is moderate chronic microvascular ischemic change, also worsened. There is no significant extra-axial fluid collection. The calvarium and skullbase are intact.  No acute intracranial findings are evident.  CT CERVICAL SPINE FINDINGS  The vertebral column, pedicles and facet articulations are intact. There is no evidence of acute fracture. No acute soft tissue abnormalities are evident.  Moderate arthritic changes are evident.  IMPRESSION: 1. Negative for acute intracranial traumatic injury. There is moderately severe generalized atrophy and chronic microvascular disease, worsened from 2013. 2. Negative for acute cervical spine fracture.   Electronically Signed   By: Andreas Newport M.D.   On: 08/15/2015 19:33   Dg Hip Unilat With Pelvis 2-3 Views Left  08/15/2015   CLINICAL DATA:  Severe hip pain post syncopal fall today while walking in his garage.  EXAM: DG HIP (WITH OR WITHOUT PELVIS) 2-3V LEFT  COMPARISON:  None.  FINDINGS: There is an intertrochanteric left hip fracture with varus angulation. There is moderate displacement of the lesser trochanteric fragment. There is no dislocation. There is no bone lesion or bony destruction to suggest a pathologic basis for the fracture. Incidentally noted prostatic seed implants. Incompletely imaged lower  lumbar spinal fixation hardware.  IMPRESSION: Intertrochanteric left hip fracture   Electronically Signed   By: Andreas Newport M.D.   On: 08/15/2015 19:19    Scheduled Meds: . ferrous sulfate  325 mg Oral Q breakfast  . folic acid  1 mg Oral Daily  .  morphine injection  2 mg Intravenous Once  . multivitamin with minerals  1 tablet Oral Daily  . pantoprazole (PROTONIX) IV  40 mg Intravenous QHS  . thiamine  100 mg Oral Daily   Or  . thiamine  100 mg Intravenous Daily   Continuous Infusions: . sodium chloride 100 mL/hr at 08/16/15 7510    Active Problems:   Syncope and collapse   Anemia    Hypokalemia   Dehydration   Diarrhea   QT prolongation   Alcohol abuse   Anxiety   Syncope    Time spent: >35 minutes     Kinnie Feil  [facility] Pager [phone]. If 7PM-7AM, please contact night-coverage at [email or web address], password [facility] 08/16/2015, 8:31 AM  LOS: 1 day

## 2015-08-16 NOTE — Procedures (Signed)
ELECTROENCEPHALOGRAM REPORT   Patient: Wyatt Stein       Room #: NW2956 EEG No. ID: [id number] Age: 81 y.o.        Sex: male Referring Physician: Daleen Bo Report Date:  08/16/2015        Interpreting Physician: Alexis Goodell  History: [name] is an 81 y.o. male with recurrent episodes of syncope  Medications:  Scheduled: . ferrous sulfate  325 mg Oral Q breakfast  . folic acid  1 mg Oral Daily  . levETIRAcetam  500 mg Oral BID  .  morphine injection  2 mg Intravenous Once  . multivitamin with minerals  1 tablet Oral Daily  . pantoprazole (PROTONIX) IV  40 mg Intravenous QHS  . thiamine  100 mg Oral Daily   Or  . thiamine  100 mg Intravenous Daily    Conditions of Recording:  This is a 16 channel EEG carried out with the patient in the awake and drowsy states.  Description:  The waking background activity consists of a low voltage, symmetrical, fairly well organized, 9 Hz alpha activity, seen from the parieto-occipital and posterior temporal regions.  Low voltage fast activity, poorly organized, is seen anteriorly and is at times superimposed on more posterior regions.  A mixture of theta and alpha rhythms are seen from the central and temporal regions. The patient drowses with slowing to irregular, low voltage theta and beta activity.   Stage II sleep is not obtained. No epileptiform activity is noted.   Hyperventilation was not performed.  Intermittent photic stimulation was performed but failed to illicit any change in the tracing.    IMPRESSION: Normal electroencephalogram, awake, drowsy and with activation procedures. There are no focal lateralizing or epileptiform features.   Alexis Goodell, MD [facility] [phone] 08/16/2015, 4:08 PM

## 2015-08-16 NOTE — Progress Notes (Signed)
VASCULAR LAB PRELIMINARY  PRELIMINARY  PRELIMINARY  PRELIMINARY  Carotid duplex  completed.    Preliminary report:  Bilateral:  1-39% ICA stenosis.  Vertebral artery flow is antegrade.      Wyatt Stein, RVT 08/16/2015, 11:49 AM

## 2015-08-17 ENCOUNTER — Encounter (HOSPITAL_COMMUNITY): Payer: Self-pay | Admitting: Orthopaedic Surgery

## 2015-08-17 DIAGNOSIS — I10 Essential (primary) hypertension: Secondary | ICD-10-CM

## 2015-08-17 LAB — CBC
HCT: 26.7 % — ABNORMAL LOW (ref 39.0–52.0)
Hemoglobin: 9.4 g/dL — ABNORMAL LOW (ref 13.0–17.0)
MCH: 34.6 pg — AB (ref 26.0–34.0)
MCHC: 35.2 g/dL (ref 30.0–36.0)
MCV: 98.2 fL (ref 78.0–100.0)
PLATELETS: 154 10*3/uL (ref 150–400)
RBC: 2.72 MIL/uL — AB (ref 4.22–5.81)
RDW: 20.7 % — AB (ref 11.5–15.5)
WBC: 9.1 10*3/uL (ref 4.0–10.5)

## 2015-08-17 LAB — BASIC METABOLIC PANEL
Anion gap: 9 (ref 5–15)
BUN: 13 mg/dL (ref 6–20)
CALCIUM: 7.8 mg/dL — AB (ref 8.9–10.3)
CO2: 22 mmol/L (ref 22–32)
CREATININE: 1.03 mg/dL (ref 0.61–1.24)
Chloride: 107 mmol/L (ref 101–111)
GFR calc Af Amer: >60 mL/min (ref >60)
GLUCOSE: 96 mg/dL (ref 65–99)
POTASSIUM: 3.9 mmol/L (ref 3.5–5.1)
SODIUM: 138 mmol/L (ref 135–145)

## 2015-08-17 LAB — VITAMIN D 25 HYDROXY (VIT D DEFICIENCY, FRACTURES): Vit D, 25-Hydroxy: 18.4 ng/mL — ABNORMAL LOW (ref 30.0–100.0)

## 2015-08-17 MED ORDER — ASPIRIN 325 MG PO TBEC: 325.0000 mg | DELAYED_RELEASE_TABLET | Freq: Every day | ORAL | Status: DC

## 2015-08-17 MED ORDER — CHLORDIAZEPOXIDE HCL 25 MG PO CAPS
25.0000 mg | ORAL_CAPSULE | Freq: Two times a day (BID) | ORAL | Status: DC
  Administered 2015-08-17 (×2): 25 mg via ORAL
  Filled 2015-08-17 (×2): qty 1

## 2015-08-17 MED ORDER — INFLUENZA VAC SPLIT QUAD 0.5 ML IM SUSY
0.5000 mL | PREFILLED_SYRINGE | INTRAMUSCULAR | Status: AC
  Administered 2015-08-18: 0.5 mL via INTRAMUSCULAR
  Filled 2015-08-17 (×2): qty 0.5

## 2015-08-17 MED ORDER — HYDROCODONE-ACETAMINOPHEN 5-325 MG PO TABS: 1.0000 | ORAL_TABLET | ORAL | Status: DC | PRN

## 2015-08-17 MED ORDER — PANTOPRAZOLE SODIUM 40 MG PO TBEC
40.0000 mg | DELAYED_RELEASE_TABLET | Freq: Every day | ORAL | Status: DC
  Administered 2015-08-18 – 2015-08-20 (×3): 40 mg via ORAL
  Filled 2015-08-17 (×4): qty 1

## 2015-08-17 NOTE — Progress Notes (Signed)
[facility] PROGRESS NOTE  Wyatt Stein XNA:[id number] DOB: May 02, 1934 DOA: 08/15/2015 PCP: Wenda Low, MD  Assessment/Plan: 81 y/o male with PMH of HTN, COPD, Chronic Tobacco use, Alcoholism presented with presyncope and fall and L hip fracture. Patient denied syncope/loss of consciousness to me. He reported having poor oral intake for a while   1. Presyncope, dehydration likely due to poor oral intake in the setting of chronic alcoholism ? seizure . neuro exam is non focal. CT head: brain atrophy likely due to alcohol. No acute cardiopulmonary symptoms. Repeat ECG: NSR. Troponins: negative.  -Patient is started on Keppra per neurology. No new event while inpatient. Monitor   2.  Fall and L hip fracture. S/p ORIF L hip on 10/3. Awaiting PT/OT. ASA only recommended for dvt prophylaxis per ortho  3. Alcoholism. Patient reports h/o withdrawals. He is having some tremors, tachycardic and at risk for DTs.. Start librium. Cont monitor on CIWA closely. Counseled to cut alcohol use  4. COPD.chronic tobacco use. No wheezing. CXR: no clear infiltrates. Cont prb bronchodilators. Counseled to stop smoking.  5. H/o HTN. Patient is not on anymeds. Cont monitor  6. Anemia. Likely alcoholic. No s/s of acute bleeding. CTF sed 1 unit post op. Hg today 9.4.  TF PRBC as needed   Code Status: full Family Communication:  D/w patient, his wife. RN (indicate person spoken with, relationship, and if by phone, the number) Disposition Plan: pend PT   Consultants:  Ortho   Procedures:  L hip   Antibiotics:  none (indicate start date, and stop date if known)  HPI/Subjective: Alert, no distress. Mild tremors   Objective: Filed Vitals:   08/17/15 0552  BP: 123/79  Pulse: 112  Temp: 97.5 F (36.4 C)  Resp: 18    Intake/Output Summary (Last 24 hours) at 08/17/15 0829 Last data filed at 08/17/15 0553  Gross per 24 hour  Intake   2050 ml  Output   1280 ml  Net    770 ml   Filed  Weights   08/15/15 2303  Weight: 53.5 kg (117 lb 15.1 oz)    Exam:   General:  Tremors   Cardiovascular: s1,s2 tachycardia   Respiratory: CTA BL  Abdomen: soft, nt,nd   Musculoskeletal: no leg edema   Data Reviewed: Basic Metabolic Panel:  Recent Labs Lab 08/15/15 1800 08/16/15 0535 08/17/15 0532  NA 134* 135 138  K 3.3* 4.2 3.9  CL 98* 106 107  CO2 '23 23 22  '$ GLUCOSE 97 82 96  BUN '12 12 13  '$ CREATININE 1.12 0.95 1.03  CALCIUM 7.9* 7.6* 7.8*   Liver Function Tests:  Recent Labs Lab 08/15/15 2139 08/16/15 0535  AST 133* 91*  ALT 40 36  ALKPHOS 100 98  BILITOT 1.7* 1.9*  PROT 6.5 6.0*  ALBUMIN 3.5 3.1*   No results for input(s): LIPASE, AMYLASE in the last 168 hours. No results for input(s): AMMONIA in the last 168 hours. CBC:  Recent Labs Lab 08/15/15 1800 08/16/15 0535 08/17/15 0532  WBC 3.9* 4.3 9.1  NEUTROABS 2.9  --   --   HGB 9.8* 8.6* 9.4*  HCT 27.6* 24.2* 26.7*  MCV 104.2* 105.7* 98.2  PLT 174 167 154   Cardiac Enzymes:  Recent Labs Lab 08/15/15 1806 08/16/15 0008 08/16/15 0535 08/16/15 1200  TROPONINI <0.03 <0.03 <0.03 <0.03   BNP (last 3 results) No results for input(s): BNP in the last 8760 hours.  ProBNP (last 3 results) No results for input(s): PROBNP in  the last 8760 hours.  CBG:  Recent Labs Lab 08/15/15 1828  GLUCAP 96    Recent Results (from the past 240 hour(s))  Surgical pcr screen     Status: None   Collection Time: 08/16/15  7:30 AM  Result Value Ref Range Status   MRSA, PCR NEGATIVE NEGATIVE Final   Staphylococcus aureus NEGATIVE NEGATIVE Final    Comment:        The Xpert SA Assay (FDA approved for NASAL specimens in patients over 73 years of age), is one component of a comprehensive surveillance program.  Test performance has been validated by [facility] for patients greater than or equal to 48 year old. It is not intended to diagnose infection nor to guide or monitor treatment.       Studies: Dg Chest 1 View  08/15/2015   CLINICAL DATA:  Fall, hip fracture, preoperative evaluation. History of cancer, COPD.  EXAM: CHEST 1 VIEW  COMPARISON:  Chest radiograph October 23, 2012  FINDINGS: Cardiomediastinal silhouette normal. Mildly increased lung volumes compatible with history of COPD without pleural effusion or focal consolidation. No pneumothorax. Patient is osteopenic. Thoracolumbar hardware partially imaged.  IMPRESSION: COPD, no superimposed acute cardiopulmonary process.   Electronically Signed   By: Elon Alas M.D.   On: 08/15/2015 22:36   Ct Head Wo Contrast  08/15/2015   CLINICAL DATA:  Pt transported by [facility] for syncopal episode and fall approx 1hr ago. Pt reports he was walking through his garage and felt faint prior to syncope. Patient reports nausea, vomiting and incontinence.  EXAM: CT HEAD WITHOUT CONTRAST  CT CERVICAL SPINE WITHOUT CONTRAST  TECHNIQUE: Multidetector CT imaging of the head and cervical spine was performed following the standard protocol without intravenous contrast. Multiplanar CT image reconstructions of the cervical spine were also generated.  COMPARISON:  03/25/2012  FINDINGS: CT HEAD FINDINGS  There is no intracranial hemorrhage, mass or evidence of acute infarction. There is moderately severe generalized atrophy, worsened from 2013. There is moderate chronic microvascular ischemic change, also worsened. There is no significant extra-axial fluid collection. The calvarium and skullbase are intact.  No acute intracranial findings are evident.  CT CERVICAL SPINE FINDINGS  The vertebral column, pedicles and facet articulations are intact. There is no evidence of acute fracture. No acute soft tissue abnormalities are evident.  Moderate arthritic changes are evident.  IMPRESSION: 1. Negative for acute intracranial traumatic injury. There is moderately severe generalized atrophy and chronic microvascular disease, worsened from 2013. 2. Negative for  acute cervical spine fracture.   Electronically Signed   By: Andreas Newport M.D.   On: 08/15/2015 19:33   Ct Cervical Spine Wo Contrast  08/15/2015   CLINICAL DATA:  Pt transported by [facility] for syncopal episode and fall approx 1hr ago. Pt reports he was walking through his garage and felt faint prior to syncope. Patient reports nausea, vomiting and incontinence.  EXAM: CT HEAD WITHOUT CONTRAST  CT CERVICAL SPINE WITHOUT CONTRAST  TECHNIQUE: Multidetector CT imaging of the head and cervical spine was performed following the standard protocol without intravenous contrast. Multiplanar CT image reconstructions of the cervical spine were also generated.  COMPARISON:  03/25/2012  FINDINGS: CT HEAD FINDINGS  There is no intracranial hemorrhage, mass or evidence of acute infarction. There is moderately severe generalized atrophy, worsened from 2013. There is moderate chronic microvascular ischemic change, also worsened. There is no significant extra-axial fluid collection. The calvarium and skullbase are intact.  No acute intracranial findings are evident.  CT CERVICAL SPINE FINDINGS  The vertebral column, pedicles and facet articulations are intact. There is no evidence of acute fracture. No acute soft tissue abnormalities are evident.  Moderate arthritic changes are evident.  IMPRESSION: 1. Negative for acute intracranial traumatic injury. There is moderately severe generalized atrophy and chronic microvascular disease, worsened from 2013. 2. Negative for acute cervical spine fracture.   Electronically Signed   By: Andreas Newport M.D.   On: 08/15/2015 19:33   Dg C-arm 1-60 Min-no Report  08/16/2015   CLINICAL DATA: surgery   C-ARM 1-60 MINUTES  Fluoroscopy was utilized by the requesting physician.  No radiographic  interpretation.    Dg Hip Unilat With Pelvis 2-3 Views Left  08/15/2015   CLINICAL DATA:  Severe hip pain post syncopal fall today while walking in his garage.  EXAM: DG HIP (WITH OR WITHOUT  PELVIS) 2-3V LEFT  COMPARISON:  None.  FINDINGS: There is an intertrochanteric left hip fracture with varus angulation. There is moderate displacement of the lesser trochanteric fragment. There is no dislocation. There is no bone lesion or bony destruction to suggest a pathologic basis for the fracture. Incidentally noted prostatic seed implants. Incompletely imaged lower lumbar spinal fixation hardware.  IMPRESSION: Intertrochanteric left hip fracture   Electronically Signed   By: Andreas Newport M.D.   On: 08/15/2015 19:19   Dg Femur Min 2 Views Left  08/16/2015   CLINICAL DATA:  Left femur fracture fixation.  EXAM: LEFT FEMUR 2 VIEWS; DG C-ARM 1-60 MIN-NO REPORT  COMPARISON:  Radiographs 08/15/2015.  FINDINGS: Nine spot fluoroscopic images are submitted. These demonstrate the sequential open reduction and internal fixation of the comminuted intertrochanteric femur fracture using a dynamic femoral neck screw and intramedullary rod. There is improved alignment of the main fracture fragments. The lesser trochanter remains medially displaced. No complications identified.  IMPRESSION: Intraoperative views during left femoral ORIF.   Electronically Signed   By: Richardean Sale M.D.   On: 08/16/2015 21:02    Scheduled Meds: . aspirin EC  325 mg Oral Q breakfast  .  ceFAZolin (ANCEF) IV  2 g Intravenous Q6H  . ferrous sulfate  325 mg Oral Q breakfast  . folic acid  1 mg Oral Daily  . levETIRAcetam  500 mg Oral BID  .  morphine injection  2 mg Intravenous Once  . multivitamin with minerals  1 tablet Oral Daily  . pantoprazole (PROTONIX) IV  40 mg Intravenous QHS  . thiamine  100 mg Oral Daily   Or  . thiamine  100 mg Intravenous Daily   Continuous Infusions: . sodium chloride 75 mL/hr at 08/17/15 0105    Active Problems:   Syncope and collapse   Anemia   Hypokalemia   Dehydration   Diarrhea   QT prolongation   Alcohol abuse   Anxiety   Syncope    Time spent: >35 minutes      Kinnie Feil  [facility] Pager [phone]. If 7PM-7AM, please contact night-coverage at [email or web address], password [facility] 08/17/2015, 8:29 AM  LOS: 2 days

## 2015-08-17 NOTE — Discharge Planning (Signed)
RN requested consideration of PT assess/treat order.

## 2015-08-17 NOTE — Care Management Important Message (Signed)
Important Message  Patient Details  Name: Wyatt Stein MRN: [id number] Date of Birth: [birth year]   Medicare Important Message Given:  Yes-second notification given    Camillo Flaming 08/17/2015, 12:49 [place] Message  Patient Details  Name: Wyatt Stein MRN: [id number] Date of Birth: 03-12-1934   Medicare Important Message Given:  Yes-second notification given    Camillo Flaming 08/17/2015, 12:48 PM

## 2015-08-17 NOTE — Plan of Care (Signed)
When RN removed Foley - per order. Rn noted minimal bleeding after discontinuation. When NA later went in to check for possible incontinence - Found moderate size blood clot in bed.  RN notified and assessend. NA asked to re-notify RN if clots/bleeding continued. Dr. Informed via text.

## 2015-08-17 NOTE — Discharge Instructions (Signed)
Can get incisions wet daily in the shower and new dry dressing applied as needed.

## 2015-08-17 NOTE — Progress Notes (Signed)
Subjective: 1 Day Post-Op Procedure(s) (LRB): INTRAMEDULLARY (IM) NAIL INTERTROCHANTRIC (Left) Sleeping.  His wife is at the bedside and said that he seemed to rest well last night after surgery.  H/h stable this am.  Did receive one unit of blood during surgery.  Objective: Vital signs in last 24 hours: Temp:  [97.5 F (36.4 C)-99.6 F (37.6 C)] 97.5 F (36.4 C) (10/04 0552) Pulse Rate:  [88-120] 112 (10/04 0552) Resp:  [15-21] 18 (10/04 0552) BP: (123-183)/(71-124) 123/79 mmHg (10/04 0552) SpO2:  [97 %-100 %] 100 % (10/04 0552)  Intake/Output from previous day: 10/03 0701 - 10/04 0700 In: 2050 [I.V.:1610; Blood:335; IV Piggyback:105] Out: 1280 [Urine:1180; Blood:100] Intake/Output this shift:     Recent Labs  08/15/15 1800 08/16/15 0535 08/17/15 0532  HGB 9.8* 8.6* 9.4*    Recent Labs  08/16/15 0535 08/17/15 0532  WBC 4.3 9.1  RBC 2.29*  2.29* 2.72*  HCT 24.2* 26.7*  PLT 167 154    Recent Labs  08/16/15 0535 08/17/15 0532  NA 135 138  K 4.2 3.9  CL 106 107  CO2 23 22  BUN 12 13  CREATININE 0.95 1.03  GLUCOSE 82 96  CALCIUM 7.6* 7.8*    Recent Labs  08/15/15 1800  INR 0.99    Intact pulses distally Incision: scant drainage  Assessment/Plan: 1 Day Post-Op Procedure(s) (LRB): INTRAMEDULLARY (IM) NAIL INTERTROCHANTRIC (Left) Up with therapy - WBAT left hip with assistance. Will likely need SNF post-op Aspirin only for DVT coverage secondary to him being a significant fall risk  BLACKMAN,CHRISTOPHER Y 08/17/2015, 7:18 AM

## 2015-08-17 NOTE — Progress Notes (Signed)
Subjective: Affect remain flat.  Reports that he is not sure but feels that his hip may have been operated on.  Does not complain of pain.  On Keppra.  No complaints of side effects.  No seizures noted.  Patient is in mitts.    Objective: Current vital signs: BP 117/68 mmHg  Pulse 99  Temp(Src) 97.5 F (36.4 C) (Oral)  Resp 21  Ht '5\' 9"'$  (1.753 m)  Wt 61.2 kg (134 lb 14.7 oz)  BMI 19.92 kg/m2  SpO2 95% Vital signs in last 24 hours: Temp:  [97.5 F (36.4 C)-99.6 F (37.6 C)] 97.5 F (36.4 C) (10/04 1000) Pulse Rate:  [88-120] 99 (10/04 1000) Resp:  [15-21] 21 (10/04 1000) BP: (117-183)/(68-124) 117/68 mmHg (10/04 1000) SpO2:  [95 %-100 %] 95 % (10/04 1000) Weight:  [61.2 kg (134 lb 14.7 oz)] 61.2 kg (134 lb 14.7 oz) (10/04 1044)  Intake/Output from previous day: 10/03 0701 - 10/04 0700 In: 2050 [I.V.:1610; Blood:335; IV Piggyback:105] Out: 1280 [Urine:1180; Blood:100] Intake/Output this shift: Total I/O In: -  Out: 200 [Urine:200] Nutritional status: Diet 2 gram sodium Room service appropriate?: Yes; Fluid consistency:: Thin  Neurologic Exam: Mental Status: Alert.  Speech fluent without evidence of aphasia. Able to follow 3 step commands without difficulty. Cranial Nerves: II: Discs flat bilaterally; Visual fields grossly normal, pupils equal, round, reactive to light and accommodation III,IV, VI: ptosis not present, extra-ocular motions intact bilaterally V,VII: smile symmetric, facial light touch sensation normal bilaterally VIII: hearing normal bilaterally IX,X: gag reflex present XI: bilateral shoulder shrug XII: midline tongue extension Motor: Right :Upper extremity 5/5Left: Upper extremity 5/5 Lower extremity 5/5Lower extremity Unable to test due to fracture but able to wiggle toes  Mild low amplitude, high frequency tremor of both upper extremities.   Gait: not tested due to fracture   Lab Results: Basic Metabolic Panel:  Recent Labs Lab 08/15/15 1800 08/16/15 0535 08/17/15 0532  NA 134* 135 138  K 3.3* 4.2 3.9  CL 98* 106 107  CO2 '23 23 22  '$ GLUCOSE 97 82 96  BUN '12 12 13  '$ CREATININE 1.12 0.95 1.03  CALCIUM 7.9* 7.6* 7.8*    Liver Function Tests:  Recent Labs Lab 08/15/15 2139 08/16/15 0535  AST 133* 91*  ALT 40 36  ALKPHOS 100 98  BILITOT 1.7* 1.9*  PROT 6.5 6.0*  ALBUMIN 3.5 3.1*   No results for input(s): LIPASE, AMYLASE in the last 168 hours. No results for input(s): AMMONIA in the last 168 hours.  CBC:  Recent Labs Lab 08/15/15 1800 08/16/15 0535 08/17/15 0532  WBC 3.9* 4.3 9.1  NEUTROABS 2.9  --   --   HGB 9.8* 8.6* 9.4*  HCT 27.6* 24.2* 26.7*  MCV 104.2* 105.7* 98.2  PLT 174 167 154    Cardiac Enzymes:  Recent Labs Lab 08/15/15 1806 08/16/15 0008 08/16/15 0535 08/16/15 1200  TROPONINI <0.03 <0.03 <0.03 <0.03    Lipid Panel: No results for input(s): CHOL, TRIG, HDL, CHOLHDL, VLDL, LDLCALC in the last 168 hours.  CBG:  Recent Labs Lab 08/15/15 1828  GLUCAP 67    Microbiology: Results for orders placed or performed during the hospital encounter of 08/15/15  Surgical pcr screen     Status: None   Collection Time: 08/16/15  7:30 AM  Result Value Ref Range Status   MRSA, PCR NEGATIVE NEGATIVE Final   Staphylococcus aureus NEGATIVE NEGATIVE Final    Comment:        The Xpert SA Assay (  FDA approved for NASAL specimens in patients over 47 years of age), is one component of a comprehensive surveillance program.  Test performance has been validated by [facility] for patients greater than or equal to 60 year old. It is not intended to diagnose infection nor to guide or monitor treatment.     Coagulation Studies:  Recent Labs  08/15/15 1800  LABPROT 13.3  INR 0.99    Imaging: Dg Chest 1 View  08/15/2015   CLINICAL DATA:  Fall, hip fracture, preoperative  evaluation. History of cancer, COPD.  EXAM: CHEST 1 VIEW  COMPARISON:  Chest radiograph October 23, 2012  FINDINGS: Cardiomediastinal silhouette normal. Mildly increased lung volumes compatible with history of COPD without pleural effusion or focal consolidation. No pneumothorax. Patient is osteopenic. Thoracolumbar hardware partially imaged.  IMPRESSION: COPD, no superimposed acute cardiopulmonary process.   Electronically Signed   By: Elon Alas M.D.   On: 08/15/2015 22:36   Ct Head Wo Contrast  08/15/2015   CLINICAL DATA:  Pt transported by [facility] for syncopal episode and fall approx 1hr ago. Pt reports he was walking through his garage and felt faint prior to syncope. Patient reports nausea, vomiting and incontinence.  EXAM: CT HEAD WITHOUT CONTRAST  CT CERVICAL SPINE WITHOUT CONTRAST  TECHNIQUE: Multidetector CT imaging of the head and cervical spine was performed following the standard protocol without intravenous contrast. Multiplanar CT image reconstructions of the cervical spine were also generated.  COMPARISON:  03/25/2012  FINDINGS: CT HEAD FINDINGS  There is no intracranial hemorrhage, mass or evidence of acute infarction. There is moderately severe generalized atrophy, worsened from 2013. There is moderate chronic microvascular ischemic change, also worsened. There is no significant extra-axial fluid collection. The calvarium and skullbase are intact.  No acute intracranial findings are evident.  CT CERVICAL SPINE FINDINGS  The vertebral column, pedicles and facet articulations are intact. There is no evidence of acute fracture. No acute soft tissue abnormalities are evident.  Moderate arthritic changes are evident.  IMPRESSION: 1. Negative for acute intracranial traumatic injury. There is moderately severe generalized atrophy and chronic microvascular disease, worsened from 2013. 2. Negative for acute cervical spine fracture.   Electronically Signed   By: Andreas Newport M.D.   On:  08/15/2015 19:33   Ct Cervical Spine Wo Contrast  08/15/2015   CLINICAL DATA:  Pt transported by [facility] for syncopal episode and fall approx 1hr ago. Pt reports he was walking through his garage and felt faint prior to syncope. Patient reports nausea, vomiting and incontinence.  EXAM: CT HEAD WITHOUT CONTRAST  CT CERVICAL SPINE WITHOUT CONTRAST  TECHNIQUE: Multidetector CT imaging of the head and cervical spine was performed following the standard protocol without intravenous contrast. Multiplanar CT image reconstructions of the cervical spine were also generated.  COMPARISON:  03/25/2012  FINDINGS: CT HEAD FINDINGS  There is no intracranial hemorrhage, mass or evidence of acute infarction. There is moderately severe generalized atrophy, worsened from 2013. There is moderate chronic microvascular ischemic change, also worsened. There is no significant extra-axial fluid collection. The calvarium and skullbase are intact.  No acute intracranial findings are evident.  CT CERVICAL SPINE FINDINGS  The vertebral column, pedicles and facet articulations are intact. There is no evidence of acute fracture. No acute soft tissue abnormalities are evident.  Moderate arthritic changes are evident.  IMPRESSION: 1. Negative for acute intracranial traumatic injury. There is moderately severe generalized atrophy and chronic microvascular disease, worsened from 2013. 2. Negative for acute cervical spine  fracture.   Electronically Signed   By: Andreas Newport M.D.   On: 08/15/2015 19:33   Dg C-arm 1-60 Min-no Report  08/16/2015   CLINICAL DATA: surgery   C-ARM 1-60 MINUTES  Fluoroscopy was utilized by the requesting physician.  No radiographic  interpretation.    Dg Hip Unilat With Pelvis 2-3 Views Left  08/15/2015   CLINICAL DATA:  Severe hip pain post syncopal fall today while walking in his garage.  EXAM: DG HIP (WITH OR WITHOUT PELVIS) 2-3V LEFT  COMPARISON:  None.  FINDINGS: There is an intertrochanteric left hip  fracture with varus angulation. There is moderate displacement of the lesser trochanteric fragment. There is no dislocation. There is no bone lesion or bony destruction to suggest a pathologic basis for the fracture. Incidentally noted prostatic seed implants. Incompletely imaged lower lumbar spinal fixation hardware.  IMPRESSION: Intertrochanteric left hip fracture   Electronically Signed   By: Andreas Newport M.D.   On: 08/15/2015 19:19   Dg Femur Min 2 Views Left  08/16/2015   CLINICAL DATA:  Left femur fracture fixation.  EXAM: LEFT FEMUR 2 VIEWS; DG C-ARM 1-60 MIN-NO REPORT  COMPARISON:  Radiographs 08/15/2015.  FINDINGS: Nine spot fluoroscopic images are submitted. These demonstrate the sequential open reduction and internal fixation of the comminuted intertrochanteric femur fracture using a dynamic femoral neck screw and intramedullary rod. There is improved alignment of the main fracture fragments. The lesser trochanter remains medially displaced. No complications identified.  IMPRESSION: Intraoperative views during left femoral ORIF.   Electronically Signed   By: Richardean Sale M.D.   On: 08/16/2015 21:02    Medications:  I have reviewed the patient's current medications. Scheduled: . aspirin EC  325 mg Oral Q breakfast  . chlordiazePOXIDE  25 mg Oral BID  . ferrous sulfate  325 mg Oral Q breakfast  . folic acid  1 mg Oral Daily  . [START ON 08/18/2015] Influenza vac split quadrivalent PF  0.5 mL Intramuscular Tomorrow-1000  . levETIRAcetam  500 mg Oral BID  .  morphine injection  2 mg Intravenous Once  . multivitamin with minerals  1 tablet Oral Daily  . [START ON 08/18/2015] pantoprazole  40 mg Oral Daily  . thiamine  100 mg Oral Daily   Or  . thiamine  100 mg Intravenous Daily    Assessment/Plan: No seizures noted.  Patient tolerating Keppra.  No side effects noted.    Recommendations: 1.  Continue Keppra at current dose.     LOS: 2 days   Alexis Goodell, MD [facility] [phone] 08/17/2015  12:04 PM

## 2015-08-17 NOTE — Op Note (Signed)
NAMEMAKO, PELFREY NO.:  [id number]  MEDICAL RECORD NO.:  [id number]  LOCATION:  [place]                         FACILITY:  [facility]  PHYSICIAN:  Lind Guest. Ninfa Linden, M.D.DATE OF BIRTH:  Jan 17, 1934  DATE OF PROCEDURE:  08/16/2015 DATE OF DISCHARGE:                              OPERATIVE REPORT   PREOPERATIVE DIAGNOSIS:  Left displaced comminuted intertrochanteric hip/femur fracture.  POSTOPERATIVE DIAGNOSIS:  Left displaced comminuted intertrochanteric hip/femur fracture.  PROCEDURE:  Open reduction and internal fixation of left intertrochanteric hip fracture using intramedullary rod and hip screw.  IMPLANTS:  Biomet 9 x 380 femoral nail with 95 mm lag screw.  SURGEON:  Lind Guest. Ninfa Linden, M.D.  ANESTHESIA:  General.  BLOOD LOSS:  100 mL.  ANTIBIOTICS:  2 g of IV Ancef.  FLUIDS:  1 unit of packed red blood cells.  COMPLICATIONS:  None.  INDICATIONS:  Mr. Riddle is a 81 year old gentleman who had a syncopal episode yesterday.  He sustained a mechanical fall and suffered a left intertrochanteric hip fracture.  He was admitted to the Medicine Service and extensive workup was done today for clearance for surgery.  He is now presenting for an operative intervention of this left hip.  I explained to him and his wife the fracture showing them x-rays and explaining the risks and benefits of surgery and why surgery was recommended.  The risks included any type of perioperative cardiac events, acute blood loss anemia, nerve and vessel injury, and nonunion. The goals will be decreased pain, improved mobility, and overall improved quality of life given the nature of this fracture.  DESCRIPTION OF PROCEDURE:  After informed consent was obtained, appropriate left hip was marked.  He was brought to the operating room and general anesthesia was obtained while he was on a stretcher. Traction boot was placed on his right upper leg and he was placed supine on  the HANA fracture table with his left operative leg in in-line skeletal traction and the right hip abducted and flexed out of the field with appropriate padding in the popliteal area.  Perineal post was placed as well.  Under direct fluoroscopy, we reduced the fracture with traction and internal rotation.  We then chose our rod within a sterile box choosing a 9 x 380 rod based on its position in the femoral canal in the leg.  We then prepped the left hip with DuraPrep and sterile drapes. Time-out was called and he was identified as correct patient, correct left hip.  We then made an incision just proximal to the greater trochanter and lateral thigh and dissected down to the tip of the greater trochanter.  A guide pin was then placed in an antegrade fashion under direct fluoroscopy.  We then used an initiating reamer to open up the femoral canal and removed the guide pin.  I then placed the real 9 x 380 femoral nail down the canal easily.  Using the outrigger guide, we then made a separate lateral incision and placed temporary guide pin from the lateral cortex of the femur traversing the fracture into a center-center position in the femoral head.  We then took a measurement of this and drilled for a  95 mm lag screw from.  We then placed the screw without difficulty and let the traction off the leg and placed a compression format watching the fracture compress under right fluoroscopy.  We then removed the outrigger guide and rod from above. We rotated the hip through internal external rotation pleased with the positioning of the femoral nail.  We then irrigated both wounds with normal saline solution and closed the deep tissue with 0-Vicryl followed 2-0 Vicryl in the subcutaneous tissue, interrupted staples on the skin. Well-padded sterile dressing was applied.  He was taken off the fracture table, awakened, extubated, taken to the recovery room in a stable condition.  All final counts were  correct.  There were no complications noted.     Lind Guest. Ninfa Linden, M.D.     CYB/MEDQ  D:  08/16/2015  T:  08/17/2015  Job:  [id number]

## 2015-08-17 NOTE — Evaluation (Signed)
Physical Therapy Evaluation Patient Details Name: Wyatt Stein MRN: [id number] DOB: 1934-10-06 Today's Date: 08/17/2015   History of Present Illness  Apparently had a syncopal episode. He landed directly on his left hip and is found to have a displaced left intertrochanteric hip fracture. He reports several syncopal episodes occuring this year. 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   Clinical Impression  Difficult to get into standing, decreased control of the L leg , pushing posteriorly, relates pain in the hip. Was premedicated. Unable to safely transfer to the recliner. Continue PT while in acute care. Patient will benefit from PT to address problems listed in the note below.     Follow Up Recommendations SNF;Supervision/Assistance - 24 hour    Equipment Recommendations  None recommended by PT    Recommendations for Other Services       Precautions / Restrictions Precautions Precautions: Fall Precaution Comments: incontinent of urine Restrictions Weight Bearing Restrictions: Yes LLE Weight Bearing: Weight bearing as tolerated      Mobility  Bed Mobility Overal bed mobility: Needs Assistance Bed Mobility: Supine to Sit;Sit to Supine     Supine to sit: Total assist;+2 for physical assistance;+2 for safety/equipment;HOB elevated Sit to supine: Total assist;+2 for physical assistance;+2 for safety/equipment   General bed mobility comments: patient tends to lean posteriorly, is resisitivel to sitting forward.  Transfers Overall transfer level: Needs assistance Equipment used: Rolling walker (2 wheeled) Transfers: Sit to/from Stand Sit to Stand: Total assist;+2 physical assistance;+2 safety/equipment         General transfer comment: attempted x 2 to stand at the rolling wallker, The left leg  is floppy, rolls out, unable to bear weight  Ambulation/Gait                Stairs            Wheelchair  Mobility    Modified Rankin (Stroke Patients Only)       Balance Overall balance assessment: Needs assistance;History of Falls Sitting-balance support: Feet supported;Bilateral upper extremity supported Sitting balance-Leahy Scale: Poor Sitting balance - Comments: pushes  back, momemtarily able to support self Postural control: Posterior lean                                   Pertinent Vitals/Pain Pain Assessment: Faces Faces Pain Scale: Hurts whole lot Pain Location: L  hip when moving, it tends to be floppy Pain Descriptors / Indicators: Discomfort;Grimacing;Guarding;Tender Pain Intervention(s): Limited activity within patient's tolerance;Monitored during session;Premedicated before session;Repositioned    Home Living Family/patient expects to be discharged to:: Skilled nursing facility Living Arrangements: Spouse/significant other                    Prior Function Level of Independence: Independent               Hand Dominance        Extremity/Trunk Assessment   Upper Extremity Assessment: Overall WFL for tasks assessed           Lower Extremity Assessment: LLE deficits/detail   LLE Deficits / Details: patient is unable to move the leg, unable to bear weight on the  leg standing, leg tends to flop out     Communication   Communication: No difficulties  Cognition Arousal/Alertness: Awake/alert Behavior During Therapy: WFL for tasks assessed/performed Overall Cognitive Status: No family/caregiver present to determine  baseline cognitive functioning Area of Impairment: Orientation               General Comments: was oriented to [facility] and october    General Comments      Exercises        Assessment/Plan    PT Assessment    PT Diagnosis Difficulty walking;Acute pain;Altered mental status   PT Problem List    PT Treatment Interventions     PT Goals (Current goals can be found in the Care Plan section) Acute Rehab  PT Goals Patient Stated Goal: patient  states he wantas to watch football. PT Goal Formulation: Patient unable to participate in goal setting Time For Goal Achievement: 08/31/15 Potential to Achieve Goals: Good    Frequency     Barriers to discharge        Co-evaluation               End of Session Equipment Utilized During Treatment: Gait belt Activity Tolerance: Patient limited by pain Patient left: in bed;with call bell/phone within reach;with bed alarm set Nurse Communication: Mobility status         Time: 9355-2174 PT Time Calculation (min) (ACUTE ONLY): 21 min   Charges:   PT Evaluation $Initial PT Evaluation Tier I: 1 Procedure     PT G CodesClaretha Stein 08/17/2015, 12:36 PM Wyatt Stein PT [phone]

## 2015-08-18 DIAGNOSIS — R569 Unspecified convulsions: Secondary | ICD-10-CM | POA: Insufficient documentation

## 2015-08-18 DIAGNOSIS — F419 Anxiety disorder, unspecified: Secondary | ICD-10-CM

## 2015-08-18 DIAGNOSIS — E860 Dehydration: Secondary | ICD-10-CM

## 2015-08-18 DIAGNOSIS — S72142A Displaced intertrochanteric fracture of left femur, initial encounter for closed fracture: Secondary | ICD-10-CM | POA: Diagnosis not present

## 2015-08-18 DIAGNOSIS — F1010 Alcohol abuse, uncomplicated: Secondary | ICD-10-CM

## 2015-08-18 LAB — BASIC METABOLIC PANEL
Anion gap: 7 (ref 5–15)
BUN: 17 mg/dL (ref 6–20)
CHLORIDE: 108 mmol/L (ref 101–111)
CO2: 23 mmol/L (ref 22–32)
CREATININE: 1.04 mg/dL (ref 0.61–1.24)
Calcium: 7.5 mg/dL — ABNORMAL LOW (ref 8.9–10.3)
GFR calc non Af Amer: >60 mL/min (ref >60)
Glucose, Bld: 94 mg/dL (ref 65–99)
POTASSIUM: 3.9 mmol/L (ref 3.5–5.1)
Sodium: 138 mmol/L (ref 135–145)

## 2015-08-18 LAB — URINE CULTURE: CULTURE: NO GROWTH

## 2015-08-18 LAB — CBC
HEMATOCRIT: 23.5 % — AB (ref 39.0–52.0)
HEMOGLOBIN: 8.3 g/dL — AB (ref 13.0–17.0)
MCH: 35.2 pg — AB (ref 26.0–34.0)
MCHC: 35.3 g/dL (ref 30.0–36.0)
MCV: 99.6 fL (ref 78.0–100.0)
Platelets: 145 10*3/uL — ABNORMAL LOW (ref 150–400)
RBC: 2.36 MIL/uL — ABNORMAL LOW (ref 4.22–5.81)
RDW: 20.4 % — ABNORMAL HIGH (ref 11.5–15.5)
WBC: 8.8 10*3/uL (ref 4.0–10.5)

## 2015-08-18 MED ORDER — CHLORDIAZEPOXIDE HCL 5 MG PO CAPS
5.0000 mg | ORAL_CAPSULE | Freq: Two times a day (BID) | ORAL | Status: DC
  Administered 2015-08-18: 5 mg via ORAL
  Filled 2015-08-18: qty 1

## 2015-08-18 MED ORDER — CHLORDIAZEPOXIDE HCL 5 MG PO CAPS
5.0000 mg | ORAL_CAPSULE | Freq: Two times a day (BID) | ORAL | Status: DC
  Administered 2015-08-18: 5 mg via ORAL
  Filled 2015-08-18 (×2): qty 1

## 2015-08-18 MED ORDER — ENSURE ENLIVE PO LIQD
237.0000 mL | Freq: Two times a day (BID) | ORAL | Status: DC
  Administered 2015-08-18 – 2015-08-20 (×3): 237 mL via ORAL

## 2015-08-18 NOTE — Progress Notes (Signed)
Initial Nutrition Assessment  DOCUMENTATION CODES:   Non-severe (moderate) malnutrition in context of chronic illness, Underweight  INTERVENTION:  - Will order Ensure Enlive BID, each supplement provides 350 kcal and 20 grams of protein - Encourage PO intakes at meals and with supplements - RD will continue to monitor for needs  NUTRITION DIAGNOSIS:   Inadequate oral intake related to poor appetite as evidenced by per patient/family report.  GOAL:   Patient will meet greater than or equal to 90% of their needs, Weight gain  MONITOR:   PO intake, Supplement acceptance, Weight trends, Labs, Skin, I & O's  REASON FOR ASSESSMENT:   Other (Comment) (underweight BMI)  ASSESSMENT:   Presented with patient had syncopal event yesterday which he did not disclose and then had another episode today patient reports feeling lightheaded prior to falling. The fall was onto left hip resulting in significant pain and inability to walk. EMS was called was noted to have left leg shortening and rotation. He was taken to emergency department and was found to have intertrochanteric left hip fracture s/p surgical repair 08/16/15.  Pt seen for underweight BMI. Pt ate 15% of breakfast this AM with no other intakes documented since admission. Pt and wife, who is at bedside, provide all information. Pt did not like the omelet he had for breakfast as it was "rubbery." Wife ordered baked potato, [place], and iced tea for pt for lunch and is hopeful he will do well with this meal. Pt states that he has had a poor appetite for a long time, unable to give more definitive time frame, and that food does not interest him. When asked about favorite foods pt lists meat items but also states he likes potatoes and rice and gravy. He does not like most vegetables but does like okra.   At home, pt was drinking Boost and Equate nutrition shake. He does not like chocolate flavor of supplements and so is willing to accept  Ensure instead; he likes vanilla flavor. Pt states that over the past few months he has lost ~20 lbs. This would indicate ~13% body weight in unknown number of months as pt is unable to further specify time frame. Severe and moderate muscle and moderate fat wasting noted.  Not meeting needs. Medications reviewed. Labs reviewed; Ca; 7.5 mg/dL.   Diet Order:  Diet 2 gram sodium Room service appropriate?: Yes; Fluid consistency:: Thin  Skin:  Wound (see comment) (L hip incision)  Last BM:  10/2  Height:   Ht Readings from Last 1 Encounters:  08/15/15 '5\' 9"'$  (1.753 m)    Weight:   Wt Readings from Last 1 Encounters:  08/17/15 134 lb 14.7 oz (61.2 kg)    Ideal Body Weight:  72.73 kg (kg)  BMI:  Body mass index is 19.92 kg/(m^2).  Estimated Nutritional Needs:   Kcal:  1550-1750  Protein:  60-70 grams  Fluid:  2-2.2 L/day  EDUCATION NEEDS:   No education needs identified at this time     Jarome Matin, RD, LDN Inpatient Clinical Dietitian Pager # [phone] After hours/weekend pager # [phone]

## 2015-08-18 NOTE — Evaluation (Signed)
Occupational Therapy Evaluation Patient Details Name: Wyatt Stein MRN: [id number] DOB: 1934-04-26 Today's Date: 08/18/2015    History of Present Illness Apparently had a syncopal episode. He landed directly on his left hip and is found to have a displaced left intertrochanteric hip fracture. He reports several syncopal episodes occuring this year. 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    Clinical Impression   Patient presenting with decreased ADL and functional mobility independence secondary to above. Patient independent PTA. Patient currently functioning at an overall total assist level. Patient will benefit from acute OT to increase overall independence in the areas of ADLs, functional mobility, and overall safety in order to safely discharge to venue listed below.     Follow Up Recommendations  SNF;Supervision/Assistance - 24 hour    Equipment Recommendations  Other (comment) (TBD next venue of care)    Recommendations for Other Services  None at this time    Precautions / Restrictions Precautions Precautions: Fall Precaution Comments: incontinent of urine Restrictions Weight Bearing Restrictions: Yes LLE Weight Bearing: Weight bearing as tolerated    Mobility Bed Mobility General bed mobility comments: Pt found seated in recliner upon OT entering/exiting room   Transfers General transfer comment: Pt attempted to stand X3, unable due to pain and weakness    Balance Overall balance assessment: Needs assistance Sitting-balance support: No upper extremity supported;Feet supported Sitting balance-Leahy Scale: Poor     ADL Overall ADL's : Needs assistance/impaired General ADL Comments: Limited eval secondary to pain and overall weakness. Recommend all ADLs be done at bed level for pt a staff safety. Pt overall total assist for ADLs and functional mobility. Pt unable to stand even after 3X attempts. Pt made comment "I'm  just so weak". Encouraged pt to make sure he is eating and drinking.     Pertinent Vitals/Pain Pain Assessment: Faces Faces Pain Scale: Hurts worst Pain Location: "excruiating pain" in left hip Pain Descriptors / Indicators: Aching;Sore;Grimacing;Guarding Pain Intervention(s): Limited activity within patient's tolerance;Repositioned;Monitored during session     Hand Dominance     Extremity/Trunk Assessment Upper Extremity Assessment Upper Extremity Assessment: Generalized weakness   Lower Extremity Assessment Lower Extremity Assessment: Defer to PT evaluation   Communication Communication Communication: No difficulties   Cognition Arousal/Alertness: Awake/alert Behavior During Therapy: WFL for tasks assessed/performed Overall Cognitive Status: Within Functional Limits for tasks assessed General Comments: pt soft spoken              Home Living Family/patient expects to be discharged to:: Skilled nursing facility Living Arrangements: Spouse/significant other Additional Comments: Pt has walk-in shower with removable shower chair and elevated toilet seats      Prior Functioning/Environment Level of Independence: Independent     OT Diagnosis: Generalized weakness;Acute pain   OT Problem List: Decreased strength;Decreased range of motion;Decreased activity tolerance;Impaired balance (sitting and/or standing);Decreased safety awareness;Decreased knowledge of use of DME or AE;Decreased knowledge of precautions;Pain   OT Treatment/Interventions: Self-care/ADL training;Therapeutic exercise;Energy conservation;DME and/or AE instruction;Therapeutic activities;Patient/family education;Balance training    OT Goals(Current goals can be found in the care plan section) Acute Rehab OT Goals Patient Stated Goal: none stated OT Goal Formulation: With patient/family Time For Goal Achievement: 09/01/15 Potential to Achieve Goals: Good ADL Goals Pt Will Perform Grooming: with  set-up;sitting (unsupported) Pt Will Perform Lower Body Bathing: with mod assist;sit to/from stand;with adaptive equipment Pt Will Perform Lower Body Dressing: with mod assist;sit to/from stand;with adaptive equipment Pt Will Transfer to Toilet:  with mod assist;bedside commode;stand pivot transfer Additional ADL Goal #1: Pt will perform sit<>stands with min assist using DME and AD prn  OT Frequency: Min 2X/week   Barriers to D/C: Decreased caregiver support   End of Session Equipment Utilized During Treatment: Gait belt;Rolling walker  Activity Tolerance: Patient limited by pain Patient left: in chair;with call bell/phone within reach   Time: 1240-1256 OT Time Calculation (min): 16 min Charges:  OT General Charges $OT Visit: 1 Procedure OT Evaluation $Initial OT Evaluation Tier I: 1 Procedure  Euline Kimbler , MS, OTR/L, CLT Pager: [phone]  08/18/2015, 1:42 PM

## 2015-08-18 NOTE — Clinical Documentation Improvement (Signed)
Internal Medicine  Abnormal Lab/Test Results:  08/15/15: Na= 134  Possible Clinical Conditions associated with below indicators  Hyponatremia  Other Condition  Cannot Clinically Determine  Treatment Provided: IVF's  Please exercise your independent, professional judgment when responding. A specific answer is not anticipated or expected.   Thank You,  Rolm Gala, RN, [place] [phone]

## 2015-08-18 NOTE — Progress Notes (Signed)
[facility] PROGRESS NOTE  Wyatt Stein GMW:[id number] DOB: 05/24/1934 DOA: 08/15/2015 PCP: Wenda Low, MD  Assessment/Plan: 81 y/o male with PMH of HTN, COPD, Chronic Tobacco use, Alcoholism presented with presyncope and fall and L hip fracture. Patient denied syncope/loss of consciousness to me. He reported having poor oral intake for a while   1. Presyncope, dehydration likely due to poor oral intake in the setting of chronic alcoholism ? seizure . neuro exam is non focal. CT head: brain atrophy likely due to alcohol. No acute cardiopulmonary symptoms. Repeat EKG: NSR. Troponins: negative.   2. Seizure like episodes -long history of this, seen by Neurology and started on Keppra this admission -could be a component of ETOH withdrawal too -FU with Neurology, CT head without acute findings  2.  Fall and L hip fracture.  -S/p ORIF L hip on 10/3 -PT eval completed, SNF recommended - ASA only recommended for dvt prophylaxis per ortho   3. Alcoholism. Patient reports h/o withdrawals.  -no overt withdrawal at this time, will cut down librium dose and wean off  4. COPD.chronic tobacco use. No wheezing. CXR: no clear infiltrates. Cont prb bronchodilators. Counseled to stop smoking.   5. H/o HTN.  -Patient is not on anymeds. Cont monitor   6. Anemia. Due to post op blood loss, dilution and Alcoholism -given 1 unit post op. Hg 8.3 today, repeat in am, no overt bleeding, mild hematuria monitor, ? Foley trauma  DVT proph: SCDs  Code Status: full Family Communication:  None at bedside Disposition Plan: SNF tomorrow if stable   Consultants:  Ortho   Procedures:  L hip   Antibiotics:  none (indicate start date, and stop date if known)  HPI/Subjective: Complaints abt food, questions abt rehab Objective: Filed Vitals:   08/18/15 0518  BP: 123/76  Pulse: 80  Temp: 98.3 F (36.8 C)  Resp: 18    Intake/Output Summary (Last 24 hours) at 08/18/15 0828 Last  data filed at 08/18/15 0600  Gross per 24 hour  Intake 1988.75 ml  Output    400 ml  Net 1588.75 ml   Filed Weights   08/15/15 2303 08/17/15 1044  Weight: 53.5 kg (117 lb 15.1 oz) 61.2 kg (134 lb 14.7 oz)    Exam:   General:  AAOx3, no distress  Cardiovascular: s1,s2 tachycardia   Respiratory: CTA BL  Abdomen: soft, nt,nd   Musculoskeletal: no leg edema   Data Reviewed: Basic Metabolic Panel:  Recent Labs Lab 08/15/15 1800 08/16/15 0535 08/17/15 0532 08/18/15 0610  NA 134* 135 138 138  K 3.3* 4.2 3.9 3.9  CL 98* 106 107 108  CO2 '23 23 22 23  '$ GLUCOSE 97 82 96 94  BUN '12 12 13 17  '$ CREATININE 1.12 0.95 1.03 1.04  CALCIUM 7.9* 7.6* 7.8* 7.5*   Liver Function Tests:  Recent Labs Lab 08/15/15 2139 08/16/15 0535  AST 133* 91*  ALT 40 36  ALKPHOS 100 98  BILITOT 1.7* 1.9*  PROT 6.5 6.0*  ALBUMIN 3.5 3.1*   No results for input(s): LIPASE, AMYLASE in the last 168 hours. No results for input(s): AMMONIA in the last 168 hours. CBC:  Recent Labs Lab 08/15/15 1800 08/16/15 0535 08/17/15 0532 08/18/15 0610  WBC 3.9* 4.3 9.1 8.8  NEUTROABS 2.9  --   --   --   HGB 9.8* 8.6* 9.4* 8.3*  HCT 27.6* 24.2* 26.7* 23.5*  MCV 104.2* 105.7* 98.2 99.6  PLT 174 167 154 145*   Cardiac Enzymes:  Recent Labs Lab 08/15/15 1806 08/16/15 0008 08/16/15 0535 08/16/15 1200  TROPONINI <0.03 <0.03 <0.03 <0.03   BNP (last 3 results) No results for input(s): BNP in the last 8760 hours.  ProBNP (last 3 results) No results for input(s): PROBNP in the last 8760 hours.  CBG:  Recent Labs Lab 08/15/15 1828  GLUCAP 96    Recent Results (from the past 240 hour(s))  Surgical pcr screen     Status: None   Collection Time: 08/16/15  7:30 AM  Result Value Ref Range Status   MRSA, PCR NEGATIVE NEGATIVE Final   Staphylococcus aureus NEGATIVE NEGATIVE Final    Comment:        The Xpert SA Assay (FDA approved for NASAL specimens in patients over 29 years of age), is  one component of a comprehensive surveillance program.  Test performance has been validated by [facility] for patients greater than or equal to 51 year old. It is not intended to diagnose infection nor to guide or monitor treatment.      Studies: Dg C-arm 1-60 Min-no Report  08/16/2015   CLINICAL DATA: surgery   C-ARM 1-60 MINUTES  Fluoroscopy was utilized by the requesting physician.  No radiographic  interpretation.    Dg Femur Min 2 Views Left  08/16/2015   CLINICAL DATA:  Left femur fracture fixation.  EXAM: LEFT FEMUR 2 VIEWS; DG C-ARM 1-60 MIN-NO REPORT  COMPARISON:  Radiographs 08/15/2015.  FINDINGS: Nine spot fluoroscopic images are submitted. These demonstrate the sequential open reduction and internal fixation of the comminuted intertrochanteric femur fracture using a dynamic femoral neck screw and intramedullary rod. There is improved alignment of the main fracture fragments. The lesser trochanter remains medially displaced. No complications identified.  IMPRESSION: Intraoperative views during left femoral ORIF.   Electronically Signed   By: Richardean Sale M.D.   On: 08/16/2015 21:02    Scheduled Meds: . aspirin EC  325 mg Oral Q breakfast  . chlordiazePOXIDE  5 mg Oral BID  . ferrous sulfate  325 mg Oral Q breakfast  . folic acid  1 mg Oral Daily  . Influenza vac split quadrivalent PF  0.5 mL Intramuscular Tomorrow-1000  . levETIRAcetam  500 mg Oral BID  .  morphine injection  2 mg Intravenous Once  . multivitamin with minerals  1 tablet Oral Daily  . pantoprazole  40 mg Oral Daily  . thiamine  100 mg Oral Daily   Or  . thiamine  100 mg Intravenous Daily   Continuous Infusions:    Active Problems:   Syncope and collapse   Anemia   Hypokalemia   Dehydration   Diarrhea   QT prolongation   Alcohol abuse   Anxiety   Syncope    Time spent: >35 minutes     [facility]  [facility] Pager [phone]. If 7PM-7AM, please contact night-coverage at  [email or web address], password [email or web address] 08/18/2015, 8:28 AM  LOS: 3 days

## 2015-08-18 NOTE — Progress Notes (Signed)
Subjective: Patient remains with a flat affect.  No restraints.  Wife at the bedside.  No seizures noted.  Remains on Keppra.    Objective: Current vital signs: BP 123/76 mmHg  Pulse 80  Temp(Src) 98.3 F (36.8 C) (Oral)  Resp 18  Ht '5\' 9"'$  (1.753 m)  Wt 61.2 kg (134 lb 14.7 oz)  BMI 19.92 kg/m2  SpO2 100% Vital signs in last 24 hours: Temp:  [97.5 F (36.4 C)-99 F (37.2 C)] 98.3 F (36.8 C) (10/05 0518) Pulse Rate:  [79-82] 80 (10/05 0518) Resp:  [18] 18 (10/05 0518) BP: (105-130)/(67-95) 123/76 mmHg (10/05 0518) SpO2:  [100 %] 100 % (10/05 0518)  Intake/Output from previous day: 10/04 0701 - 10/05 0700 In: 1988.8 [P.O.:180; I.V.:1808.8] Out: 400 [Urine:400] Intake/Output this shift: Total I/O In: 260 [P.O.:260] Out: 150 [Urine:150] Nutritional status: Diet 2 gram sodium Room service appropriate?: Yes; Fluid consistency:: Thin  Neurologic Exam: Mental Status: Alert. Flat affect.  Speech fluent without evidence of aphasia. Able to follow 3 step commands without difficulty. Cranial Nerves: II: Discs flat bilaterally; Visual fields grossly normal, pupils equal, round, reactive to light and accommodation III,IV, VI: ptosis not present, extra-ocular motions intact bilaterally V,VII: smile symmetric, facial light touch sensation normal bilaterally VIII: hearing normal bilaterally IX,X: gag reflex present XI: bilateral shoulder shrug XII: midline tongue extension Motor: Right :Upper extremity 5/5Left: Upper extremity 5/5 Lower extremity 5/5Lower extremity Unable to test due to fracture but able to wiggle toes  No tremor noted.    Gait: not tested due to fracture  Lab Results: Basic Metabolic Panel:  Recent Labs Lab 08/15/15 1800 08/16/15 0535 08/17/15 0532 08/18/15 0610  NA 134* 135 138 138  K 3.3* 4.2 3.9 3.9  CL 98* 106 107 108  CO2 '23 23 22  23  '$ GLUCOSE 97 82 96 94  BUN '12 12 13 17  '$ CREATININE 1.12 0.95 1.03 1.04  CALCIUM 7.9* 7.6* 7.8* 7.5*    Liver Function Tests:  Recent Labs Lab 08/15/15 2139 08/16/15 0535  AST 133* 91*  ALT 40 36  ALKPHOS 100 98  BILITOT 1.7* 1.9*  PROT 6.5 6.0*  ALBUMIN 3.5 3.1*   No results for input(s): LIPASE, AMYLASE in the last 168 hours. No results for input(s): AMMONIA in the last 168 hours.  CBC:  Recent Labs Lab 08/15/15 1800 08/16/15 0535 08/17/15 0532 08/18/15 0610  WBC 3.9* 4.3 9.1 8.8  NEUTROABS 2.9  --   --   --   HGB 9.8* 8.6* 9.4* 8.3*  HCT 27.6* 24.2* 26.7* 23.5*  MCV 104.2* 105.7* 98.2 99.6  PLT 174 167 154 145*    Cardiac Enzymes:  Recent Labs Lab 08/15/15 1806 08/16/15 0008 08/16/15 0535 08/16/15 1200  TROPONINI <0.03 <0.03 <0.03 <0.03    Lipid Panel: No results for input(s): CHOL, TRIG, HDL, CHOLHDL, VLDL, LDLCALC in the last 168 hours.  CBG:  Recent Labs Lab 08/15/15 1828  GLUCAP 77    Microbiology: Results for orders placed or performed during the hospital encounter of 08/15/15  Surgical pcr screen     Status: None   Collection Time: 08/16/15  7:30 AM  Result Value Ref Range Status   MRSA, PCR NEGATIVE NEGATIVE Final   Staphylococcus aureus NEGATIVE NEGATIVE Final    Comment:        The Xpert SA Assay (FDA approved for NASAL specimens in patients over 59 years of age), is one component of a comprehensive surveillance program.  Test performance has been validated  by [facility] for patients greater than or equal to 52 year old. It is not intended to diagnose infection nor to guide or monitor treatment.   Culture, Urine     Status: None   Collection Time: 08/17/15  8:48 AM  Result Value Ref Range Status   Specimen Description URINE, CLEAN CATCH  Final   Special Requests NONE  Final   Culture   Final    NO GROWTH 1 DAY Performed at [facility]    Report Status 08/18/2015 FINAL  Final    Coagulation  Studies:  Recent Labs  08/15/15 1800  LABPROT 13.3  INR 0.99    Imaging: Dg C-arm 1-60 Min-no Report  08/16/2015   CLINICAL DATA: surgery   C-ARM 1-60 MINUTES  Fluoroscopy was utilized by the requesting physician.  No radiographic  interpretation.    Dg Femur Min 2 Views Left  08/16/2015   CLINICAL DATA:  Left femur fracture fixation.  EXAM: LEFT FEMUR 2 VIEWS; DG C-ARM 1-60 MIN-NO REPORT  COMPARISON:  Radiographs 08/15/2015.  FINDINGS: Nine spot fluoroscopic images are submitted. These demonstrate the sequential open reduction and internal fixation of the comminuted intertrochanteric femur fracture using a dynamic femoral neck screw and intramedullary rod. There is improved alignment of the main fracture fragments. The lesser trochanter remains medially displaced. No complications identified.  IMPRESSION: Intraoperative views during left femoral ORIF.   Electronically Signed   By: Richardean Sale M.D.   On: 08/16/2015 21:02    Medications:  I have reviewed the patient's current medications. Scheduled: . aspirin EC  325 mg Oral Q breakfast  . chlordiazePOXIDE  5 mg Oral BID  . feeding supplement (ENSURE ENLIVE)  237 mL Oral BID BM  . ferrous sulfate  325 mg Oral Q breakfast  . folic acid  1 mg Oral Daily  . levETIRAcetam  500 mg Oral BID  .  morphine injection  2 mg Intravenous Once  . multivitamin with minerals  1 tablet Oral Daily  . pantoprazole  40 mg Oral Daily  . thiamine  100 mg Oral Daily   Or  . thiamine  100 mg Intravenous Daily    Assessment/Plan: Patient improved today.  Not as tremulous.  Has had no seizures.  Remains on Keppra and CIWA protocol.  Spoke with wife at length (45 minutes) concerning patient's alcoholism, anorexia and depression.  Discussed prognosis as well.  Reiterated that patient unable to drive and operate at heights.    Recommendations: 1.  Agree with current management.  Patient to continue Keppra at '500mg'$  BID   LOS: 3 days   Alexis Goodell,  MD [facility] [phone] 08/18/2015  5:59 PM

## 2015-08-18 NOTE — Progress Notes (Signed)
Subjective: 2 Days Post-Op Procedure(s) (LRB): INTRAMEDULLARY (IM) NAIL INTERTROCHANTRIC (Left) Patient reports pain as mild as long as " I lay still".    Objective: Vital signs in last 24 hours: Temp:  [97.5 F (36.4 C)-99 F (37.2 C)] 98.3 F (36.8 C) (10/05 0518) Pulse Rate:  [73-99] 80 (10/05 0518) Resp:  [18-21] 18 (10/05 0518) BP: (105-130)/(67-95) 123/76 mmHg (10/05 0518) SpO2:  [95 %-100 %] 100 % (10/05 0518) Weight:  [61.2 kg (134 lb 14.7 oz)] 61.2 kg (134 lb 14.7 oz) (10/04 1044)  Intake/Output from previous day: 10/04 0701 - 10/05 0700 In: 1988.8 [P.O.:180; I.V.:1808.8] Out: 400 [Urine:400] Intake/Output this shift:     Recent Labs  08/15/15 1800 08/16/15 0535 08/17/15 0532 08/18/15 0610  HGB 9.8* 8.6* 9.4* 8.3*    Recent Labs  08/17/15 0532 08/18/15 0610  WBC 9.1 8.8  RBC 2.72* 2.36*  HCT 26.7* 23.5*  PLT 154 145*    Recent Labs  08/17/15 0532 08/18/15 0610  NA 138 138  K 3.9 3.9  CL 107 108  CO2 22 23  BUN 13 17  CREATININE 1.03 1.04  GLUCOSE 96 94  CALCIUM 7.8* 7.5*    Recent Labs  08/15/15 1800  INR 0.99   Left lower extremity: Sensation intact distally Intact pulses distally Incision: scant drainage Compartment soft  Assessment/Plan: 2 Days Post-Op Procedure(s) (LRB): INTRAMEDULLARY (IM) NAIL INTERTROCHANTRIC (Left) Up with therapy encouraged patient to get out of bed today. Anemia acute on chronic monitor  For symptoms of anemia . Vitals stable. CLARK, [place] 08/18/2015, 7:56 AM

## 2015-08-19 DIAGNOSIS — D508 Other iron deficiency anemias: Secondary | ICD-10-CM

## 2015-08-19 DIAGNOSIS — R55 Syncope and collapse: Secondary | ICD-10-CM

## 2015-08-19 LAB — TYPE AND SCREEN
ABO/RH(D): B POS
Antibody Screen: NEGATIVE
UNIT DIVISION: 0
UNIT DIVISION: 0

## 2015-08-19 LAB — CBC
HCT: 23.2 % — ABNORMAL LOW (ref 39.0–52.0)
HEMOGLOBIN: 8.3 g/dL — AB (ref 13.0–17.0)
MCH: 35.5 pg — AB (ref 26.0–34.0)
MCHC: 35.8 g/dL (ref 30.0–36.0)
MCV: 99.1 fL (ref 78.0–100.0)
PLATELETS: 172 10*3/uL (ref 150–400)
RBC: 2.34 MIL/uL — ABNORMAL LOW (ref 4.22–5.81)
RDW: 19.8 % — AB (ref 11.5–15.5)
WBC: 7 10*3/uL (ref 4.0–10.5)

## 2015-08-19 LAB — BASIC METABOLIC PANEL
Anion gap: 8 (ref 5–15)
BUN: 24 mg/dL — AB (ref 6–20)
CALCIUM: 7.9 mg/dL — AB (ref 8.9–10.3)
CO2: 23 mmol/L (ref 22–32)
CREATININE: 1.09 mg/dL (ref 0.61–1.24)
Chloride: 108 mmol/L (ref 101–111)
GFR calc Af Amer: >60 mL/min (ref >60)
GFR calc non Af Amer: >60 mL/min (ref >60)
Glucose, Bld: 101 mg/dL — ABNORMAL HIGH (ref 65–99)
Potassium: 3.8 mmol/L (ref 3.5–5.1)
SODIUM: 139 mmol/L (ref 135–145)

## 2015-08-19 MED ORDER — LEVETIRACETAM 500 MG PO TABS
500.0000 mg | ORAL_TABLET | Freq: Two times a day (BID) | ORAL | Status: DC
Start: 2015-08-19 — End: 2015-11-01

## 2015-08-19 NOTE — Discharge Summary (Signed)
Physician Discharge Summary  Wyatt Stein KXF:[id number] DOB: 05/31/1934 DOA: 08/15/2015  PCP: Wenda Low, MD  Admit date: 08/15/2015 Discharge date: 08/19/2015  Time spent: 45 minutes  Recommendations for Outpatient Follow-up:  1. Dr.Blackman, Orthopedics in 2 weeks 2. PCP Dr.Hussain in 1-2weeks, please check CBC at FU 3. [facility] in [facility]monthfor suspected seizures  Discharge Diagnoses:    Left hip fracture   Syncope and collapse   Anemia   Hypokalemia   Dehydration   Diarrhea   QT prolongation   Alcohol abuse   Anxiety   Syncope    Suspected seizures   Discharge Condition: stable  Diet recommendation: heart helthy Filed Weights   08/15/15 2303 08/17/15 1044  Weight: 53.5 kg (117 lb 15.1 oz) 61.2 kg (134 lb 14.7 oz)    History of present illness:  Chief Complaint: Syncope and collapse HPI: Wyatt SANTELLANis a 81y.o. male   has a past medical history of GERD,Hypertension; and COPD (chronic obstructive pulmonary disease) Presented after syncopal event on 10/1 which he did not disclose and then had another episode 10/3.  patient reported feeling lightheaded prior to falling. The fall was onto left hip resulting in significant pain and inability to walk. EMS was called was noted to have left leg shortening and rotation. He was taken to emergency department and was found to have intertrochanteric left hip fracture.   Hospital Course:  81y/o male with PMH of HTN, COPD, Chronic Tobacco use, Alcoholism presented with presyncope and fall and L hip fracture. Patient denied syncope/loss of consciousness , he reported having poor oral intake for a while   1. Presyncope, dehydration likely due to poor oral intake in the setting of chronic alcoholism ? seizure . neuro exam was non focal. CT head: brain atrophy likely due to alcohol. No acute cardiopulmonary symptoms. Repeat EKG: unremarkable, Troponins: negative.  -ECHO with normal EF and wall motion  2. Seizure  like episodes -long history of this, seen by Neurology and started on Keppra this admission -could be a component of ETOH withdrawal too -FU with Neurology, CT head without acute findings -continue Keppra, tolerating this  2. Fall and L hip fracture.  -S/p ORIF L hip on 10/3 -PT eval completed, SNF recommended - ASA only recommended for dvt prophylaxis per ortho   3. Alcoholism.  -had mild withdrawals, now resolved, treated -no withdrawal at this time  4. COPD.chronic tobacco use. No wheezing. CXR: no clear infiltrates. Cont prb bronchodilators. Counseled to stop smoking.   5. H/o HTN.  -Patient is not on anymeds. Cont monitor   6. Anemia.  -acute on chronic Due to post op blood loss, dilution and Alcoholism -given 1 unit post op. Hb stable since then, it is 8.3 at discharge  Procedures: PROCEDURE: 08/16/15 by Dr.Christopher BNinfa LindenINTRAMEDULLARY (IM) NAIL INTERTROCHANTRIC (Left)  Consultations:  Orthopedics  Discharge Exam: Filed Vitals:   08/19/15 0930  BP: 118/77  Pulse: 87  Temp: 98.8 F (37.1 C)  Resp: 20    General: AAOx3 Cardiovascular: S1S2/RRR Respiratory: CTAB  Discharge Instructions   Discharge Instructions    Diet - low sodium heart healthy    Complete by:  As directed      Full weight bearing    Complete by:  As directed      Increase activity slowly    Complete by:  As directed           Current Discharge Medication List    START taking these medications  Details  aspirin EC 325 MG EC tablet Take 1 tablet (325 mg total) by mouth daily with breakfast. Qty: 30 tablet, Refills: 0    HYDROcodone-acetaminophen (NORCO/VICODIN) 5-325 MG tablet Take 1-2 tablets by mouth every 4 (four) hours as needed for moderate pain. Qty: 30 tablet, Refills: 0    levETIRAcetam (KEPPRA) 500 MG tablet Take 1 tablet (500 mg total) by mouth 2 (two) times daily.      CONTINUE these medications which have NOT CHANGED   Details  cholecalciferol  (VITAMIN D) 1000 UNITS tablet Take 1,000 Units by mouth daily.    [employer] B-1 100 MG tablet Take 100 mg by mouth daily. Refills: 11    ferrous sulfate 325 (65 FE) MG tablet Take 325 mg by mouth daily with breakfast.    Multiple Vitamins-Minerals (CENTRUM SILVER ADULT 50+) TABS Take 1 tablet by mouth daily.    omeprazole (PRILOSEC OTC) 20 MG tablet Take 20 mg by mouth daily as needed (for heartburn).       STOP taking these medications     ibuprofen (ADVIL,MOTRIN) 200 MG tablet      meloxicam (MOBIC) 15 MG tablet      LORazepam (ATIVAN) 1 MG tablet        Allergies  Allergen Reactions  . Ace Inhibitors Anaphylaxis and Swelling  . Ramipril Anaphylaxis and Itching   Follow-up Information    Follow up with Mcarthur Rossetti, MD. Schedule an appointment as soon as possible for a visit in 2 weeks.   Specialty:  Orthopedic Surgery   Contact information:   [place] [address] [phone]       Follow up with Wenda Low, MD. Schedule an appointment as soon as possible for a visit in 1 week.   Specialty:  Internal Medicine   Contact information:   [address]  [place] [address] [phone]        The results of significant diagnostics from this hospitalization (including imaging, microbiology, ancillary and laboratory) are listed below for reference.    Significant Diagnostic Studies: Dg Chest 1 View  08/15/2015   CLINICAL DATA:  Fall, hip fracture, preoperative evaluation. History of cancer, COPD.  EXAM: CHEST 1 VIEW  COMPARISON:  Chest radiograph October 23, 2012  FINDINGS: Cardiomediastinal silhouette normal. Mildly increased lung volumes compatible with history of COPD without pleural effusion or focal consolidation. No pneumothorax. Patient is osteopenic. Thoracolumbar hardware partially imaged.  IMPRESSION: COPD, no superimposed acute cardiopulmonary process.   Electronically Signed   By: Elon Alas M.D.   On:  08/15/2015 22:36   Ct Head Wo Contrast  08/15/2015   CLINICAL DATA:  Pt transported by [facility] for syncopal episode and fall approx 1hr ago. Pt reports he was walking through his garage and felt faint prior to syncope. Patient reports nausea, vomiting and incontinence.  EXAM: CT HEAD WITHOUT CONTRAST  CT CERVICAL SPINE WITHOUT CONTRAST  TECHNIQUE: Multidetector CT imaging of the head and cervical spine was performed following the standard protocol without intravenous contrast. Multiplanar CT image reconstructions of the cervical spine were also generated.  COMPARISON:  03/25/2012  FINDINGS: CT HEAD FINDINGS  There is no intracranial hemorrhage, mass or evidence of acute infarction. There is moderately severe generalized atrophy, worsened from 2013. There is moderate chronic microvascular ischemic change, also worsened. There is no significant extra-axial fluid collection. The calvarium and skullbase are intact.  No acute intracranial findings are evident.  CT CERVICAL SPINE FINDINGS  The vertebral column, pedicles  and facet articulations are intact. There is no evidence of acute fracture. No acute soft tissue abnormalities are evident.  Moderate arthritic changes are evident.  IMPRESSION: 1. Negative for acute intracranial traumatic injury. There is moderately severe generalized atrophy and chronic microvascular disease, worsened from 2013. 2. Negative for acute cervical spine fracture.   Electronically Signed   By: Andreas Newport M.D.   On: 08/15/2015 19:33   Ct Cervical Spine Wo Contrast  08/15/2015   CLINICAL DATA:  Pt transported by [facility] for syncopal episode and fall approx 1hr ago. Pt reports he was walking through his garage and felt faint prior to syncope. Patient reports nausea, vomiting and incontinence.  EXAM: CT HEAD WITHOUT CONTRAST  CT CERVICAL SPINE WITHOUT CONTRAST  TECHNIQUE: Multidetector CT imaging of the head and cervical spine was performed following the standard protocol without  intravenous contrast. Multiplanar CT image reconstructions of the cervical spine were also generated.  COMPARISON:  03/25/2012  FINDINGS: CT HEAD FINDINGS  There is no intracranial hemorrhage, mass or evidence of acute infarction. There is moderately severe generalized atrophy, worsened from 2013. There is moderate chronic microvascular ischemic change, also worsened. There is no significant extra-axial fluid collection. The calvarium and skullbase are intact.  No acute intracranial findings are evident.  CT CERVICAL SPINE FINDINGS  The vertebral column, pedicles and facet articulations are intact. There is no evidence of acute fracture. No acute soft tissue abnormalities are evident.  Moderate arthritic changes are evident.  IMPRESSION: 1. Negative for acute intracranial traumatic injury. There is moderately severe generalized atrophy and chronic microvascular disease, worsened from 2013. 2. Negative for acute cervical spine fracture.   Electronically Signed   By: Andreas Newport M.D.   On: 08/15/2015 19:33   Dg C-arm 1-60 Min-no Report  08/16/2015   CLINICAL DATA: surgery   C-ARM 1-60 MINUTES  Fluoroscopy was utilized by the requesting physician.  No radiographic  interpretation.    Dg Hip Unilat With Pelvis 2-3 Views Left  08/15/2015   CLINICAL DATA:  Severe hip pain post syncopal fall today while walking in his garage.  EXAM: DG HIP (WITH OR WITHOUT PELVIS) 2-3V LEFT  COMPARISON:  None.  FINDINGS: There is an intertrochanteric left hip fracture with varus angulation. There is moderate displacement of the lesser trochanteric fragment. There is no dislocation. There is no bone lesion or bony destruction to suggest a pathologic basis for the fracture. Incidentally noted prostatic seed implants. Incompletely imaged lower lumbar spinal fixation hardware.  IMPRESSION: Intertrochanteric left hip fracture   Electronically Signed   By: Andreas Newport M.D.   On: 08/15/2015 19:19   Dg Femur Min 2 Views  Left  08/16/2015   CLINICAL DATA:  Left femur fracture fixation.  EXAM: LEFT FEMUR 2 VIEWS; DG C-ARM 1-60 MIN-NO REPORT  COMPARISON:  Radiographs 08/15/2015.  FINDINGS: Nine spot fluoroscopic images are submitted. These demonstrate the sequential open reduction and internal fixation of the comminuted intertrochanteric femur fracture using a dynamic femoral neck screw and intramedullary rod. There is improved alignment of the main fracture fragments. The lesser trochanter remains medially displaced. No complications identified.  IMPRESSION: Intraoperative views during left femoral ORIF.   Electronically Signed   By: Richardean Sale M.D.   On: 08/16/2015 21:02    Microbiology: Recent Results (from the past 240 hour(s))  Surgical pcr screen     Status: None   Collection Time: 08/16/15  7:30 AM  Result Value Ref Range Status   MRSA, PCR NEGATIVE  NEGATIVE Final   Staphylococcus aureus NEGATIVE NEGATIVE Final    Comment:        The Xpert SA Assay (FDA approved for NASAL specimens in patients over 100 years of age), is one component of a comprehensive surveillance program.  Test performance has been validated by [facility] for patients greater than or equal to 34 year old. It is not intended to diagnose infection nor to guide or monitor treatment.   Culture, Urine     Status: None   Collection Time: 08/17/15  8:48 AM  Result Value Ref Range Status   Specimen Description URINE, CLEAN CATCH  Final   Special Requests NONE  Final   Culture   Final    NO GROWTH 1 DAY Performed at [facility]    Report Status 08/18/2015 FINAL  Final     Labs: Basic Metabolic Panel:  Recent Labs Lab 08/15/15 1800 08/16/15 0535 08/17/15 0532 08/18/15 0610 08/19/15 0542  NA 134* 135 138 138 139  K 3.3* 4.2 3.9 3.9 3.8  CL 98* 106 107 108 108  CO2 '23 23 22 23 23  '$ GLUCOSE 97 82 96 94 101*  BUN '12 12 13 17 '$ 24*  CREATININE 1.12 0.95 1.03 1.04 1.09  CALCIUM 7.9* 7.6* 7.8* 7.5* 7.9*   Liver  Function Tests:  Recent Labs Lab 08/15/15 2139 08/16/15 0535  AST 133* 91*  ALT 40 36  ALKPHOS 100 98  BILITOT 1.7* 1.9*  PROT 6.5 6.0*  ALBUMIN 3.5 3.1*   No results for input(s): LIPASE, AMYLASE in the last 168 hours. No results for input(s): AMMONIA in the last 168 hours. CBC:  Recent Labs Lab 08/15/15 1800 08/16/15 0535 08/17/15 0532 08/18/15 0610 08/19/15 0542  WBC 3.9* 4.3 9.1 8.8 7.0  NEUTROABS 2.9  --   --   --   --   HGB 9.8* 8.6* 9.4* 8.3* 8.3*  HCT 27.6* 24.2* 26.7* 23.5* 23.2*  MCV 104.2* 105.7* 98.2 99.6 99.1  PLT 174 167 154 145* 172   Cardiac Enzymes:  Recent Labs Lab 08/15/15 1806 08/16/15 0008 08/16/15 0535 08/16/15 1200  TROPONINI <0.03 <0.03 <0.03 <0.03   BNP: BNP (last 3 results) No results for input(s): BNP in the last 8760 hours.  ProBNP (last 3 results) No results for input(s): PROBNP in the last 8760 hours.  CBG:  Recent Labs Lab 08/15/15 1828  GLUCAP 28       Signed:  Ashni Lonzo  [facility] 08/19/2015, 11:00 AM

## 2015-08-19 NOTE — Clinical Social Work Placement (Signed)
   CLINICAL SOCIAL WORK PLACEMENT  NOTE  Date:  08/19/2015  Patient Details  Name: Wyatt Stein MRN: [id number] Date of Birth: 1934-05-16  Clinical Social Work is seeking post-discharge placement for this patient at the [place] level of care (*CSW will initial, date and re-position this form in  chart as items are completed):  Yes   Patient/family provided with [place] Work Department's list of facilities offering this level of care within the geographic area requested by the patient (or if unable, by the patient's family).  Yes   Patient/family informed of their freedom to choose among providers that offer the needed level of care, that participate in Medicare, Medicaid or managed care program needed by the patient, have an available bed and are willing to accept the patient.  Yes   Patient/family informed of [facility]'s ownership interest in [facility] and [facility], as well as of the fact that they are under no obligation to receive care at these facilities.  PASRR submitted to EDS on 08/19/15     PASRR number received on       Existing PASRR number confirmed on       FL2 transmitted to all facilities in geographic area requested by pt/family on 08/19/15     FL2 transmitted to all facilities within larger geographic area on       Patient informed that his/her managed care company has contracts with or will negotiate with certain facilities, including the following:            Patient/family informed of bed offers received.  Patient chooses bed at       Physician recommends and patient chooses bed at      Patient to be transferred to   on  .  Patient to be transferred to facility by       Patient family notified on   of transfer.  Name of family member notified:        PHYSICIAN Please sign FL2     Additional Comment:    _______________________________________________ Ladell Pier, LCSW 08/19/2015, 1:01  PM

## 2015-08-19 NOTE — Progress Notes (Addendum)
Physical Therapy Treatment Patient Details Name: Wyatt Stein MRN: [id number] DOB: Oct 14, 1934 Today's Date: 08/19/2015    History of Present Illness 81 yo male s/p L hip IM nail-displaced L intertrochanteric fx, syncopal episode.     PT Comments    Progressing slowly with mobility. Continues to require +2 assist for all mobility tasks. Able to take a few steps with RW on today. Pain and weakness limiting mobility. Will need SNF. If pt goes home, will need ambulance transport and 24 hour supervision/assist. Do not feel wife will be able to manage pt safely at home.   Follow Up Recommendations  SNF     Equipment Recommendations  None recommended by PT. If home may need wheelchair.    Recommendations for Other Services       Precautions / Restrictions Precautions Precautions: Fall Precaution Comments: incontinent of urine Restrictions LLE Weight Bearing: Weight bearing as tolerated    Mobility  Bed Mobility Overal bed mobility: Needs Assistance Bed Mobility: Supine to Sit;Sit to Supine     Supine to sit: Max assist;+2 for physical assistance;+2 for safety/equipment;HOB elevated Sit to supine: Max assist;+2 for physical assistance;+2 for safety/equipment;HOB elevated   General bed mobility comments: assist for trunk and bil LEs. Increased time. Utilized bedpad for scooting, positioning.   Transfers Overall transfer level: Needs assistance Equipment used: Rolling walker (2 wheeled) Transfers: Sit to/from Stand Sit to Stand: Max assist;+2 physical assistance;+2 safety/equipment;From elevated surface         General transfer comment: Assist to rise, stabilize, control descent. VCs safety technique, hand/LE placement. Increased time.   Ambulation/Gait Ambulation/Gait assistance: Mod assist;+2 physical assistance;+2 safety/equipment Ambulation Distance (Feet): 3 Feet Assistive device: Rolling walker (2 wheeled) Gait Pattern/deviations: Step-to pattern;Trunk  flexed;Antalgic     General Gait Details: 4 steps forward, 4 steps backwards. Assist to stabilize/support pt, maneuver with RW, advance/position L LE. VCS safety, technique, sequence, posture.    Stairs            Wheelchair Mobility    Modified Rankin (Stroke Patients Only)       Balance   Sitting-balance support: Feet supported;Bilateral upper extremity supported Sitting balance-Leahy Scale: Poor Sitting balance - Comments: pushes  back but able to support self with increased time   Standing balance support: Bilateral upper extremity supported;During functional activity Standing balance-Leahy Scale: Poor                      Cognition Arousal/Alertness: Awake/alert Behavior During Therapy: WFL for tasks assessed/performed Overall Cognitive Status: Within Functional Limits for tasks assessed                      Exercises      General Comments        Pertinent Vitals/Pain Pain Assessment: Faces Faces Pain Scale: Hurts whole lot Pain Location: L hip. (R foot sore as well) Pain Descriptors / Indicators: Aching;Sore;Grimacing;Guarding Pain Intervention(s): Limited activity within patient's tolerance;Repositioned;Patient requesting pain meds-RN notified    Home Living                      Prior Function            PT Goals (current goals can now be found in the care plan section) Progress towards PT goals: Progressing toward goals (slowly)    Frequency  Min 3X/week    PT Plan Current plan remains appropriate    Co-evaluation  End of Session Equipment Utilized During Treatment: Gait belt Activity Tolerance: Patient limited by pain;Patient limited by fatigue Patient left: in bed;with call bell/phone within reach;with bed alarm set     Time: 0712-1975 PT Time Calculation (min) (ACUTE ONLY): 20 min  Charges:  $Therapeutic Activity: 8-22 mins                    G Codes:      Weston Anna, MPT Pager:  [phone]

## 2015-08-19 NOTE — Progress Notes (Signed)
Subjective: 3 Days Post-Op Procedure(s) (LRB): INTRAMEDULLARY (IM) NAIL INTERTROCHANTRIC (Left) Patient reports pain as moderate.    Objective: Vital signs in last 24 hours: Temp:  [97.8 F (36.6 C)-99.3 F (37.4 C)] 98.8 F (37.1 C) (10/06 0930) Pulse Rate:  [77-88] 87 (10/06 0930) Resp:  [16-20] 20 (10/06 0930) BP: (99-143)/(57-88) 118/77 mmHg (10/06 0930) SpO2:  [97 %-99 %] 98 % (10/06 0930)  Intake/Output from previous day: 10/05 0701 - 10/06 0700 In: 740 [P.O.:740] Out: 1050 [Urine:1050] Intake/Output this shift: Total I/O In: 360 [P.O.:360] Out: -    Recent Labs  08/17/15 0532 08/18/15 0610 08/19/15 0542  HGB 9.4* 8.3* 8.3*    Recent Labs  08/18/15 0610 08/19/15 0542  WBC 8.8 7.0  RBC 2.36* 2.34*  HCT 23.5* 23.2*  PLT 145* 172    Recent Labs  08/18/15 0610 08/19/15 0542  NA 138 139  K 3.9 3.8  CL 108 108  CO2 23 23  BUN 17 24*  CREATININE 1.04 1.09  GLUCOSE 94 101*  CALCIUM 7.5* 7.9*   No results for input(s): LABPT, INR in the last 72 hours.  Neurovascular intact Compartment soft  Proximal dressing with moderate serous drainage  Assessment/Plan: 3 Days Post-Op Procedure(s) (LRB): INTRAMEDULLARY (IM) NAIL INTERTROCHANTRIC (Left) Up with therapy  Follow up with Dr. Ninfa Linden in office at 2 weeks post-op  Erskine Emery 08/19/2015, 1:14 PM

## 2015-08-19 NOTE — Progress Notes (Signed)
Subjective: Patient with improved affect today.  Scheduled for discharge today.  No further seizures noted.  Tolerating Keppra.  Eating.  Objective: Current vital signs: BP 118/77 mmHg  Pulse 87  Temp(Src) 98.8 F (37.1 C) (Oral)  Resp 20  Ht '5\' 9"'$  (1.753 m)  Wt 61.2 kg (134 lb 14.7 oz)  BMI 19.92 kg/m2  SpO2 98% Vital signs in last 24 hours: Temp:  [97.8 F (36.6 C)-99.3 F (37.4 C)] 98.8 F (37.1 C) (10/06 0930) Pulse Rate:  [77-88] 87 (10/06 0930) Resp:  [16-20] 20 (10/06 0930) BP: (99-143)/(57-88) 118/77 mmHg (10/06 0930) SpO2:  [97 %-99 %] 98 % (10/06 0930)  Intake/Output from previous day: 10/05 0701 - 10/06 0700 In: 740 [P.O.:740] Out: 1050 [Urine:1050] Intake/Output this shift: Total I/O In: 360 [P.O.:360] Out: -  Nutritional status: Diet 2 gram sodium Room service appropriate?: Yes; Fluid consistency:: Thin Diet - low sodium heart healthy  Neurologic Exam: Mental Status: Alert. Speech fluent without evidence of aphasia. Able to follow 3 step commands without difficulty. Cranial Nerves: II: Discs flat bilaterally; Visual fields grossly normal, pupils equal, round, reactive to light and accommodation III,IV, VI: ptosis not present, extra-ocular motions intact bilaterally V,VII: smile symmetric, facial light touch sensation normal bilaterally VIII: hearing normal bilaterally IX,X: gag reflex present XI: bilateral shoulder shrug XII: midline tongue extension Motor: Right :Upper extremity 5/5Left: Upper extremity 5/5 Lower extremity 5/5Lower extremity Unable to test due to fracture but able to wiggle toes  No tremor noted.   Gait: not tested due to fracture  Lab Results: Basic Metabolic Panel:  Recent Labs Lab 08/15/15 1800 08/16/15 0535 08/17/15 0532 08/18/15 0610 08/19/15 0542  NA 134* 135 138 138 139  K 3.3* 4.2 3.9 3.9 3.8  CL  98* 106 107 108 108  CO2 '23 23 22 23 23  '$ GLUCOSE 97 82 96 94 101*  BUN '12 12 13 17 '$ 24*  CREATININE 1.12 0.95 1.03 1.04 1.09  CALCIUM 7.9* 7.6* 7.8* 7.5* 7.9*    Liver Function Tests:  Recent Labs Lab 08/15/15 2139 08/16/15 0535  AST 133* 91*  ALT 40 36  ALKPHOS 100 98  BILITOT 1.7* 1.9*  PROT 6.5 6.0*  ALBUMIN 3.5 3.1*   No results for input(s): LIPASE, AMYLASE in the last 168 hours. No results for input(s): AMMONIA in the last 168 hours.  CBC:  Recent Labs Lab 08/15/15 1800 08/16/15 0535 08/17/15 0532 08/18/15 0610 08/19/15 0542  WBC 3.9* 4.3 9.1 8.8 7.0  NEUTROABS 2.9  --   --   --   --   HGB 9.8* 8.6* 9.4* 8.3* 8.3*  HCT 27.6* 24.2* 26.7* 23.5* 23.2*  MCV 104.2* 105.7* 98.2 99.6 99.1  PLT 174 167 154 145* 172    Cardiac Enzymes:  Recent Labs Lab 08/15/15 1806 08/16/15 0008 08/16/15 0535 08/16/15 1200  TROPONINI <0.03 <0.03 <0.03 <0.03    Lipid Panel: No results for input(s): CHOL, TRIG, HDL, CHOLHDL, VLDL, LDLCALC in the last 168 hours.  CBG:  Recent Labs Lab 08/15/15 1828  GLUCAP 45    Microbiology: Results for orders placed or performed during the hospital encounter of 08/15/15  Surgical pcr screen     Status: None   Collection Time: 08/16/15  7:30 AM  Result Value Ref Range Status   MRSA, PCR NEGATIVE NEGATIVE Final   Staphylococcus aureus NEGATIVE NEGATIVE Final    Comment:        The Xpert SA Assay (FDA approved for NASAL specimens in patients over  10 years of age), is one component of a comprehensive surveillance program.  Test performance has been validated by [facility] for patients greater than or equal to 22 year old. It is not intended to diagnose infection nor to guide or monitor treatment.   Culture, Urine     Status: None   Collection Time: 08/17/15  8:48 AM  Result Value Ref Range Status   Specimen Description URINE, CLEAN CATCH  Final   Special Requests NONE  Final   Culture   Final    NO GROWTH 1  DAY Performed at [facility]    Report Status 08/18/2015 FINAL  Final    Coagulation Studies: No results for input(s): LABPROT, INR in the last 72 hours.  Imaging: No results found.  Medications:  I have reviewed the patient's current medications. Scheduled: . aspirin EC  325 mg Oral Q breakfast  . feeding supplement (ENSURE ENLIVE)  237 mL Oral BID BM  . ferrous sulfate  325 mg Oral Q breakfast  . folic acid  1 mg Oral Daily  . levETIRAcetam  500 mg Oral BID  .  morphine injection  2 mg Intravenous Once  . multivitamin with minerals  1 tablet Oral Daily  . pantoprazole  40 mg Oral Daily  . thiamine  100 mg Oral Daily   Or  . thiamine  100 mg Intravenous Daily    Assessment/Plan: No further seizures noted.  On Keppra.  Tolerating well.  Recommendations: 1.  Continue Keppra at current dose.   2.  Patient to follow up with neurology after discharge in 1-2 months.     LOS: 4 days   Alexis Goodell, MD [facility] [phone] 08/19/2015  11:35 AM

## 2015-08-19 NOTE — Clinical Social Work Note (Signed)
Clinical Social Work Assessment  Patient Details  Name: Wyatt Stein MRN: [id number] Date of Birth: [birth year]  Date of referral:  08/19/15               Reason for consult:  Discharge Planning                Permission sought to share information with:  Family Supports Permission granted to share information::  Yes, Verbal Permission Granted  Name::     Denis Carreon  Agency::     Relationship::  wife  Contact Information:  [phone]  Housing/Transportation Living arrangements for the past 2 months:  [place] of Information:  Patient, Spouse Patient Interpreter Needed:  None Criminal Activity/Legal Involvement Pertinent to Current Situation/Hospitalization:  No - Comment as needed Significant Relationships:  Spouse Lives with:  Spouse Do you feel safe going back to the place where you live?  No (pt wife wants to see how pt does with PT today; pt wife may consider taking pt home with private duty care) Need for family participation in patient care:  No (Coment)  Care giving concerns:  Pt admitted from home with pt spouse. Pt has hip fracture. PT recommending SNF.   Social Worker assessment / plan:  CSW received referral for New SNF.   CSW received hand off that pt FL2 and pasarr submitted and pt pasarr referred for Level II screening.   CSW met with pt and pt wife at bedside. CSW introduced self and explained role. CSW discussed recommendation for SNF upon discharge. Pt and pt spouse expressed understanding. Pt and pt spouse agreeable to [facility] search. CSW discussed process of SNF search. CSW explained to pt and pt wife pasarr process and that pasarr was submitted, but pt was referred for a face to face evaluation by pasarr screener. CSW clarified pt wife questions surrounding this and provided support surrounding pt wife frustration that pt has to await face to face evaluation from [place].   CSW completed FL2 and initiated SNF search to [facility].   CSW to follow up with pt and pt wife regarding SNF bed offers.   CSW contacted CSW Surveyor, quantity, Nathaniel Man regarding pasarr and Nathaniel Man will contact individuals with [facility] to inquire about expediting process.  CSW to continue to follow.  Employment status:  Retired Forensic scientist:  Medicare PT Recommendations:  [place] / Referral to community resources:  [place]  Patient/Family's Response to care:  Pt alert and oriented x 4. Pt wife frustrated regarding pasarr process. Pt wife expressed wanting to see how pt did with PT and pt wife considering hiring private duty care at home, but concerned because pt has not been able to stand.   Patient/Family's Understanding of and Emotional Response to Diagnosis, Current Treatment, and Prognosis:  Pt wife expressed knowledge surrounding diagnosis. Pt wife is concerned about pasarr process delaying pt discharge.  Emotional Assessment Appearance:  Appears stated age Attitude/Demeanor/Rapport:  Other (pt is appropriate) Affect (typically observed):  Appropriate Orientation:  Oriented to Self, Oriented to Place, Oriented to  Time, Oriented to Situation Alcohol / Substance use:  Not Applicable Psych involvement (Current and /or in the community):  No (Comment)  Discharge Needs  Concerns to be addressed:  Discharge Planning Concerns Readmission within the last 30 days:  No Current discharge risk:  Physical Impairment Barriers to Discharge:  [place] (Pasarr)   Cullin Dishman, [place], LCSW 08/19/2015, 12:30  PM  [phone]  

## 2015-08-20 DIAGNOSIS — E559 Vitamin D deficiency, unspecified: Secondary | ICD-10-CM | POA: Diagnosis not present

## 2015-08-20 DIAGNOSIS — S72002D Fracture of unspecified part of neck of left femur, subsequent encounter for closed fracture with routine healing: Secondary | ICD-10-CM

## 2015-08-20 DIAGNOSIS — R41841 Cognitive communication deficit: Secondary | ICD-10-CM | POA: Diagnosis not present

## 2015-08-20 DIAGNOSIS — M25562 Pain in left knee: Secondary | ICD-10-CM | POA: Diagnosis not present

## 2015-08-20 DIAGNOSIS — R55 Syncope and collapse: Secondary | ICD-10-CM | POA: Diagnosis not present

## 2015-08-20 DIAGNOSIS — Z9181 History of falling: Secondary | ICD-10-CM | POA: Diagnosis not present

## 2015-08-20 DIAGNOSIS — R609 Edema, unspecified: Secondary | ICD-10-CM | POA: Diagnosis not present

## 2015-08-20 DIAGNOSIS — I10 Essential (primary) hypertension: Secondary | ICD-10-CM | POA: Diagnosis not present

## 2015-08-20 DIAGNOSIS — R531 Weakness: Secondary | ICD-10-CM | POA: Diagnosis not present

## 2015-08-20 DIAGNOSIS — E440 Moderate protein-calorie malnutrition: Secondary | ICD-10-CM | POA: Insufficient documentation

## 2015-08-20 DIAGNOSIS — M21252 Flexion deformity, left hip: Secondary | ICD-10-CM | POA: Diagnosis not present

## 2015-08-20 DIAGNOSIS — D62 Acute posthemorrhagic anemia: Secondary | ICD-10-CM | POA: Diagnosis not present

## 2015-08-20 DIAGNOSIS — M79662 Pain in left lower leg: Secondary | ICD-10-CM | POA: Diagnosis not present

## 2015-08-20 DIAGNOSIS — D638 Anemia in other chronic diseases classified elsewhere: Secondary | ICD-10-CM | POA: Diagnosis not present

## 2015-08-20 DIAGNOSIS — M79652 Pain in left thigh: Secondary | ICD-10-CM | POA: Diagnosis not present

## 2015-08-20 DIAGNOSIS — F1010 Alcohol abuse, uncomplicated: Secondary | ICD-10-CM | POA: Diagnosis not present

## 2015-08-20 DIAGNOSIS — J439 Emphysema, unspecified: Secondary | ICD-10-CM | POA: Diagnosis not present

## 2015-08-20 DIAGNOSIS — R2681 Unsteadiness on feet: Secondary | ICD-10-CM | POA: Diagnosis not present

## 2015-08-20 DIAGNOSIS — M25552 Pain in left hip: Secondary | ICD-10-CM | POA: Diagnosis not present

## 2015-08-20 DIAGNOSIS — M79672 Pain in left foot: Secondary | ICD-10-CM | POA: Diagnosis not present

## 2015-08-20 DIAGNOSIS — R569 Unspecified convulsions: Secondary | ICD-10-CM | POA: Diagnosis not present

## 2015-08-20 DIAGNOSIS — M6281 Muscle weakness (generalized): Secondary | ICD-10-CM | POA: Diagnosis not present

## 2015-08-20 DIAGNOSIS — S72142D Displaced intertrochanteric fracture of left femur, subsequent encounter for closed fracture with routine healing: Secondary | ICD-10-CM | POA: Diagnosis not present

## 2015-08-20 DIAGNOSIS — M25572 Pain in left ankle and joints of left foot: Secondary | ICD-10-CM | POA: Diagnosis not present

## 2015-08-20 MED ORDER — POLYETHYLENE GLYCOL 3350 17 G PO PACK
17.0000 g | PACK | Freq: Every day | ORAL | Status: DC
  Administered 2015-08-20: 17 g via ORAL
  Filled 2015-08-20: qty 1

## 2015-08-20 MED ORDER — SENNOSIDES-DOCUSATE SODIUM 8.6-50 MG PO TABS
1.0000 | ORAL_TABLET | Freq: Two times a day (BID) | ORAL | Status: DC
  Administered 2015-08-20: 1 via ORAL
  Filled 2015-08-20: qty 1

## 2015-08-20 NOTE — Progress Notes (Signed)
Pt seen and examined, no changes from DC summary 10/6  Wyatt Polite, MD

## 2015-08-20 NOTE — Anesthesia Postprocedure Evaluation (Signed)
  Anesthesia Post-op Note  Patient: Wyatt Stein  Procedure(s) Performed: Procedure(s): INTRAMEDULLARY (IM) NAIL INTERTROCHANTRIC (Left)  Patient Location: PACU  Anesthesia Type:General  Level of Consciousness: awake  Airway and Oxygen Therapy: Patient Spontanous Breathing  Post-op Pain: mild  Post-op Assessment: Post-op Vital signs reviewed              Post-op Vital Signs: Reviewed  Last Vitals:  Filed Vitals:   08/20/15 0602  BP: 145/82  Pulse: 74  Temp: 36.9 C  Resp: 16    Complications: No apparent anesthesia complications

## 2015-08-20 NOTE — Progress Notes (Signed)
CSW continuing to follow.  CSW followed up with pt and pt wife at bedside and provided SNF bed offers.  Pt wife plans to consider options and make decision regarding SNF placement.  CSW continuing to await Level II pasarr screening. CSW will contact [place] [facility] again tomorrow morning regarding pasarr.   CSW to continue to follow.  Alison Vieth, MSW, [place] Work [phone]

## 2015-08-20 NOTE — Progress Notes (Signed)
CSW continuing to follow.   CSW followed up with pt at bedside this morning. Pt wife not present at this time. Pt reports that pt and pt wife want [facility] and Rehab. RN confirmed the same. CSW discussed with pt that CSW working on situation with pasarr. CSW inquired with pt if pt had a history of anxiety and pt reports that he has never been diagnosed with anxiety and was prescribed ativan by neurologist following seizure like activity.   CSW contacted [facility] and confirmed that facility could accept pt once pasarr received.  CSW contacted [facility] regarding pt pasarr and discussed that it appears that pt diagnosis of anxiety was entered in error given pt being prescribed Ativan prior to admission. [place] Must requested 30-day note be signed by MD and faxed to [place] [facility]. CSW obtained MD signature for 30-day note and faxed to [place] Must and notified [place] [facility]. Pt was issued a 30 day pasarr by  Must and confirmed that pt could discharge to SNF.   CSW was able to reach pt wife via telephone to update. Pt wife agreeable to transfer to [facility]. Pt wife agreeable to complete paperwork at [name] in order for pt to transfer to facility today.  CSW updated pt at bedside and pt daughter, Colletta [place] present at this time. Pt grateful to be able to leave the hospital today to go to [facility] and start rehab.   CSW facilitated pt discharge needs including contacting facility, faxing pt discharge information via [facility], discussing with pt at bedside and pt wife via telephone, notifying RN and providing RN phone number to call report, and arranging ambulance transport for pt to [facility].   Pt wife requested RN to notify pt wife via telephone when ambulance arrives to transport pt. RN aware and agreeable.  No further social work needs identified at this time.  CSW signing off.   Alison Spychalski, MSW, [place] Work [phone]

## 2015-08-20 NOTE — Clinical Social Work Placement (Signed)
   CLINICAL SOCIAL WORK PLACEMENT  NOTE  Date:  08/20/2015  Patient Details  Name: Wyatt Stein MRN: [id number] Date of Birth: [birth year]  Clinical Social Work is seeking post-discharge placement for this patient at the [place] level of care (*CSW will initial, date and re-position this form in  chart as items are completed):  Yes   Patient/family provided with [facility] Work Department's list of facilities offering this level of care within the geographic area requested by the patient (or if unable, by the patient's family).  Yes   Patient/family informed of their freedom to choose among providers that offer the needed level of care, that participate in Medicare, Medicaid or managed care program needed by the patient, have an available bed and are willing to accept the patient.  Yes   Patient/family informed of [facility]'s ownership interest in [facility] and [facility], as well as of the fact that they are under no obligation to receive care at these facilities.  PASRR submitted to EDS on 08/17/15     PASRR number received on 08/20/15     Existing PASRR number confirmed on       FL2 transmitted to all facilities in geographic area requested by pt/family on 08/19/15     FL2 transmitted to all facilities within larger geographic area on       Patient informed that his/her managed care company has contracts with or will negotiate with certain facilities, including the following:        Yes   Patient/family informed of bed offers received.  Patient chooses bed at [facility]     Physician recommends and patient chooses bed at      Patient to be transferred to [facility] on 08/20/15.  Patient to be transferred to facility by ambulance Corey Harold)     Patient family notified on 08/20/15 of transfer.  Name of family member notified:  pt notified at bedside along with pt daughter, Colletta Maryland at bedside  and pt wife, [name] notified via telephone     PHYSICIAN Please sign FL2     Additional Comment:    _______________________________________________ Ladell Pier, LCSW 08/20/2015, 5:09 PM

## 2015-08-24 ENCOUNTER — Encounter: Payer: Self-pay | Admitting: Internal Medicine

## 2015-08-24 ENCOUNTER — Non-Acute Institutional Stay (SKILLED_NURSING_FACILITY): Payer: Medicare Other | Admitting: Internal Medicine

## 2015-08-24 DIAGNOSIS — R569 Unspecified convulsions: Secondary | ICD-10-CM

## 2015-08-24 DIAGNOSIS — R55 Syncope and collapse: Secondary | ICD-10-CM

## 2015-08-24 DIAGNOSIS — F1010 Alcohol abuse, uncomplicated: Secondary | ICD-10-CM | POA: Diagnosis not present

## 2015-08-24 DIAGNOSIS — D62 Acute posthemorrhagic anemia: Secondary | ICD-10-CM

## 2015-08-24 DIAGNOSIS — J439 Emphysema, unspecified: Secondary | ICD-10-CM

## 2015-08-24 DIAGNOSIS — S72002D Fracture of unspecified part of neck of left femur, subsequent encounter for closed fracture with routine healing: Secondary | ICD-10-CM | POA: Diagnosis not present

## 2015-08-24 DIAGNOSIS — D638 Anemia in other chronic diseases classified elsewhere: Secondary | ICD-10-CM

## 2015-08-24 DIAGNOSIS — E559 Vitamin D deficiency, unspecified: Secondary | ICD-10-CM

## 2015-08-24 NOTE — Progress Notes (Signed)
MRN: [id number] Name: CAEDMON LOUQUE  Sex: male Age: 81 y.o. DOB: [birth year]  [place] [facility]: [facility] Facility/Room:112 Level Of Care: SNF Provider: Inocencio Homes D Emergency Contacts: Extended Emergency Contact Information Primary Emergency Contact: Sven, [place] Address: [place] [address] [name] of [place] Phone: [phone] Mobile Phone: [phone] Relation: Spouse Secondary Emergency Contact: Edwina Barth [place] of [place] Mobile Phone: [phone] Relation: Daughter  Code Status:   Allergies: Ace inhibitors and Ramipril  Chief Complaint  Patient presents with  . New Admit To SNF    HPI: Patient is 81 y.o. male who PMH of HTN, COPD, chronic tobacco use, alcoholism presented with presyncope and fall and L hip fracture. Patient denied syncope/loss of consciousness , he reported having poor oral intake for a while. Pt was admitted to hospital from 10/2-6  Where he underwent ORIF . Hospital course was complicated by alcohol withdrawal and post -op anemia. Pt is admitted to SNF for generalized weaknes and for OT/PT. Whole at SNF pt will be followed for alcoholism, tx with thiamine and MVI, COPD, tx with bronchodilators and Vit D def ,tx with Vit D supplementation.  Past Medical History  Diagnosis Date  . GERD (gastroesophageal reflux disease)   . Hypertension   . Cancer [facility]) 2008    prostate, sees dr [name] once a year  . History of blood transfusion 2006  . PONV (postoperative nausea and vomiting) 40 yrs ago    aspiration pneumonia after tooth extraxtion  . COPD (chronic obstructive pulmonary disease) [facility])     Past Surgical History  Procedure Laterality Date  . Back fracture surgery  2006    screws and , back brace prn  . Seed implant for prostate cancer    . Back surgery    . Tonsillectomy    . Esophagogastroduodenoscopy (egd) with propofol N/A 07/01/2013    Procedure: ESOPHAGOGASTRODUODENOSCOPY (EGD) WITH  PROPOFOL;  Surgeon: Garlan Fair, MD;  Location: [facility];  Service: Endoscopy;  Laterality: N/A;  . Colonoscopy with propofol N/A 07/01/2013    Procedure: COLONOSCOPY WITH PROPOFOL;  Surgeon: Garlan Fair, MD;  Location: [facility];  Service: Endoscopy;  Laterality: N/A;  . Intramedullary (im) nail intertrochanteric Left 08/16/2015    Procedure: INTRAMEDULLARY (IM) NAIL INTERTROCHANTRIC;  Surgeon: Mcarthur Rossetti, MD;  Location: [facility];  Service: Orthopedics;  Laterality: Left;      Medication List       This list is accurate as of: 08/24/15 11:59 PM.  Always use your most recent med list.               aspirin 325 MG EC tablet  Take 1 tablet (325 mg total) by mouth daily with breakfast.     CENTRUM SILVER ADULT 50+ Tabs  Take 1 tablet by mouth daily.     cholecalciferol 1000 UNITS tablet  Commonly known as:  VITAMIN D  Take 1,000 Units by mouth daily.     CVS B-1 100 MG tablet  Generic drug:  thiamine  Take 100 mg by mouth daily.     ferrous sulfate 325 (65 FE) MG tablet  Take 325 mg by mouth daily with breakfast.     HYDROcodone-acetaminophen 5-325 MG tablet  Commonly known as:  NORCO/VICODIN  Take 1-2 tablets by mouth every 4 (four) hours as needed for moderate pain.     levETIRAcetam 500 MG tablet  Commonly known as:  KEPPRA  Take  1 tablet (500 mg total) by mouth 2 (two) times daily.     omeprazole 20 MG tablet  Commonly known as:  PRILOSEC OTC  Take 20 mg by mouth daily as needed (for heartburn).        No orders of the defined types were placed in this encounter.    Immunization History  Administered Date(s) Administered  . Influenza,inj,Quad PF,36+ Mos 08/18/2015    Social History  Substance Use Topics  . Smoking status: Current Every Day Smoker -- 0.50 packs/day for 30 years    Types: Cigarettes  . Smokeless tobacco: Never Used  . Alcohol Use: 8.4 oz/week    14 Shots of liquor per week     Comment: 2-3 shots of liquor a day     Family history is + colon CA and HD   Review of Systems  DATA OBTAINED: from patient, nurse GENERAL:  no fevers, fatigue, appetite changes SKIN: No itching, rash or wounds EYES: No eye pain, redness, chronic c/o clear d/c from eyes on and off, more R eye and clear nasal d/c. EARS: No earache, tinnitus, change in hearing NOSE: No congestion, drainage or bleeding  MOUTH/THROAT: No mouth or tooth pain, No sore throat RESPIRATORY: No cough, wheezing, SOB CARDIAC: No chest pain, palpitations, lower extremity edema  GI: No abdominal pain, No N/V/D or constipation, No heartburn or reflux  GU: No dysuria, frequency or urgency, or incontinence  MUSCULOSKELETAL: No unrelieved bone/joint pain NEUROLOGIC: No headache, dizziness or focal weakness PSYCHIATRIC: No c/o anxiety or sadness   Filed Vitals:   08/27/15 1605  BP: 130/69  Pulse: 60  Temp: 96.2 F (35.7 C)  Resp: 20    SpO2 Readings from Last 1 Encounters:  08/20/15 100%        Physical Exam  GENERAL APPEARANCE: Alert, conversant,  No acute distress.  SKIN: No diaphoresis rash HEAD: Normocephalic, atraumatic  EYES: Conjunctiva/lids clear. Pupils round, reactive. EOMs intact.  EARS: External exam WNL, canals clear. Hearing grossly normal.  NOSE: No deformity or discharge.  MOUTH/THROAT: Lips w/o lesions  RESPIRATORY: Breathing is even, unlabored. Lung sounds are clear   CARDIOVASCULAR: Heart RRR no murmurs, rubs or gallops. Some  peripheral edema LLE c/w hip surgery  GASTROINTESTINAL: Abdomen is soft, non-tender, not distended w/ normal bowel sounds. GENITOURINARY: Bladder non tender, not distended  MUSCULOSKELETAL: No abnormal joints or musculature NEUROLOGIC:  Cranial nerves 2-12 grossly intact. Moves all extremities  PSYCHIATRIC: Mood and affect appropriate to situation, no behavioral issues  Patient Active Problem List   Diagnosis Date Noted  . COPD (chronic obstructive pulmonary disease) ([place]) 08/27/2015  .  Postoperative anemia due to acute blood loss 08/27/2015  . Vitamin D deficiency 08/27/2015  . Malnutrition of moderate degree 08/20/2015  . Convulsions ([place])   . Syncope and collapse 08/15/2015  . Anemia of chronic disease 08/15/2015  . Hypokalemia 08/15/2015  . Dehydration 08/15/2015  . Diarrhea 08/15/2015  . QT prolongation 08/15/2015  . Alcohol abuse 08/15/2015  . Anxiety 08/15/2015  . Syncope 08/15/2015  . Closed left hip fracture ([place])   . Seizure ([place])     CBC    Component Value Date/Time   WBC 7.0 08/19/2015 0542   WBC 2.8* 05/21/2006 1020   RBC 2.34* 08/19/2015 0542   RBC 2.29* 08/16/2015 0535   RBC 4.05* 05/21/2006 1020   HGB 8.3* 08/19/2015 0542   HGB 14.8 05/21/2006 1020   HCT 23.2* 08/19/2015 0542   HCT 41.8 05/21/2006 1020   PLT  172 08/19/2015 0542   PLT 260 05/21/2006 1020   MCV 99.1 08/19/2015 0542   MCV 103.4* 05/21/2006 1020   LYMPHSABS 0.4* 08/15/2015 1800   LYMPHSABS 1.0 05/21/2006 1020   MONOABS 0.6 08/15/2015 1800   MONOABS 0.5 05/21/2006 1020   EOSABS 0.0 08/15/2015 1800   EOSABS 0.0 05/21/2006 1020   BASOSABS 0.0 08/15/2015 1800   BASOSABS 0.0 05/21/2006 1020    CMP     Component Value Date/Time   NA 139 08/19/2015 0542   K 3.8 08/19/2015 0542   CL 108 08/19/2015 0542   CO2 23 08/19/2015 0542   GLUCOSE 101* 08/19/2015 0542   BUN 24* 08/19/2015 0542   CREATININE 1.09 08/19/2015 0542   CALCIUM 7.9* 08/19/2015 0542   PROT 6.0* 08/16/2015 0535   ALBUMIN 3.1* 08/16/2015 0535   AST 91* 08/16/2015 0535   ALT 36 08/16/2015 0535   ALKPHOS 98 08/16/2015 0535   BILITOT 1.9* 08/16/2015 0535   GFRNONAA >60 08/19/2015 0542   GFRAA >60 08/19/2015 0542    No results found for: HGBA1C   Dg C-arm 1-60 Min-no Report  08/16/2015   CLINICAL DATA: surgery   C-ARM 1-60 MINUTES  Fluoroscopy was utilized by the requesting physician.  No radiographic  interpretation.    Dg Femur Min 2 Views Left  08/16/2015   CLINICAL DATA:  Left femur fracture  fixation.  EXAM: LEFT FEMUR 2 VIEWS; DG C-ARM 1-60 MIN-NO REPORT  COMPARISON:  Radiographs 08/15/2015.  FINDINGS: Nine spot fluoroscopic images are submitted. These demonstrate the sequential open reduction and internal fixation of the comminuted intertrochanteric femur fracture using a dynamic femoral neck screw and intramedullary rod. There is improved alignment of the main fracture fragments. The lesser trochanter remains medially displaced. No complications identified.  IMPRESSION: Intraoperative views during left femoral ORIF.   Electronically Signed   By: Richardean Sale M.D.   On: 08/16/2015 21:02    Not all labs, radiology exams or other studies done during hospitalization come through on my EPIC note; however they are reviewed by me.    Assessment and Plan  Closed left hip fracture (HCC) -S/p ORIF L hip on 10/3 -PT eval completed, SNF recommended SNF - ASA only recommended for dvt prophylaxis per ortho ; ASA 325 daily, OT/PT  Syncope and collapse dehydration likely due to poor oral intake in the setting of chronic alcoholism ? seizure . neuro exam was non focal. CT head: brain atrophy likely due to alcohol. No acute cardiopulmonary symptoms. Repeat EKG: unremarkable, Troponins: negative.  -ECHO with normal EF and wall motion   Seizure ([place]) -long history of this, seen by Neurology and started on Keppra this admission -could be a component of ETOH withdrawal too -FU with Neurology, CT head without acute findings  SNF - continue Keppra   Alcohol abuse had mild withdrawals, now resolved, treated; SNF - cont thiamine and MVI  COPD (chronic obstructive pulmonary disease) ([place]) chronic tobacco use. No wheezing. CXR: no clear infiltrates; counseled to stop smoking SNF -  Cont prn bronchodilators    Postoperative anemia due to acute blood loss acute on chronic Due to post op blood loss, dilution and Alcoholism -given 1 unit post op. Hb stable since then, it is 8.3 at  discharge SNF - CBC to follow improve; cont iron suppment   Anemia of chronic disease Alcoholism being the chronic dx; SNF - cont iron supp  Vitamin D deficiency SNF - cont 1000 u vit D daily   Time spent 35  min;> 50% of time with patient was spent reviewing records, labs, tests and studies, counseling and developing plan of care  Hennie Duos, MD

## 2015-08-25 DIAGNOSIS — S72142D Displaced intertrochanteric fracture of left femur, subsequent encounter for closed fracture with routine healing: Secondary | ICD-10-CM | POA: Diagnosis not present

## 2015-08-26 ENCOUNTER — Non-Acute Institutional Stay (SKILLED_NURSING_FACILITY): Payer: Medicare Other | Admitting: Internal Medicine

## 2015-08-26 DIAGNOSIS — D62 Acute posthemorrhagic anemia: Secondary | ICD-10-CM | POA: Diagnosis not present

## 2015-08-26 DIAGNOSIS — R609 Edema, unspecified: Secondary | ICD-10-CM

## 2015-08-26 NOTE — Progress Notes (Signed)
Patient ID: Wyatt Stein, male   DOB: 23-Apr-1934, 81 y.o.   MRN: [id number] MRN: [id number] Name: Wyatt Stein  Sex: male Age: 81 y.o. DOB: 02-Oct-1934  [place] [facility]: [facility] Facility/Room:112 Level Of Care: SNF Provider: Wille Celeste Emergency Contacts: Extended Emergency Contact Information Primary Emergency Contact: Kenji, [place] Address: [place] [address] [name] of [place] Phone: [phone] Mobile Phone: [phone] Relation: Spouse Secondary Emergency Contact: Edwina Barth [place] Mobile Phone: [phone] Relation: Daughter  Code Status:   Allergies: Ace inhibitors and Ramipril  Chief Complaint  Patient presents with  . Acute Visit    HPI: Patient is 81 y.o. male who PMH of HTN, COPD, chronic tobacco use, alcoholism presented with presyncope and fall and L hip fracture. Patient denied syncope/loss of consciousness , he reported having poor oral intake for a while. Pt was admitted to hospital from 10/2-6  Where he underwent ORIF . Hospital course was complicated by alcohol withdrawal and post -op anemia. Pt was admitted to SNF for generalized weaknes and for OT/PT. Whole at SNF pt  followed for alcoholism, tx with thiamine and MVI, COPD, tx with bronchodilators and Vit D def ,tx with Vit D supplementation.  Updated labs show a hemoglobin of 7.3 which is down slightly from postop he did receive a transfusion in the hospital however he is now on iron.  It also appears he may have some mildly increased edema of his left leg he does not really complain of  Acute  pain with this.  He does still have significant weakness and debility.  His vital signs appear to be stable  Past Medical History  Diagnosis Date  . GERD (gastroesophageal reflux disease)   . Hypertension   . Cancer [facility]) 2008    prostate, sees dr [name] once a year  . History of blood transfusion 2006  . PONV (postoperative nausea and  vomiting) 40 yrs ago    aspiration pneumonia after tooth extraxtion  . COPD (chronic obstructive pulmonary disease) [facility])     Past Surgical History  Procedure Laterality Date  . Back fracture surgery  2006    screws and , back brace prn  . Seed implant for prostate cancer    . Back surgery    . Tonsillectomy    . Esophagogastroduodenoscopy (egd) with propofol N/A 07/01/2013    Procedure: ESOPHAGOGASTRODUODENOSCOPY (EGD) WITH PROPOFOL;  Surgeon: Garlan Fair, MD;  [facility];  Service: Endoscopy;  Laterality: N/A;  . Colonoscopy with propofol N/A 07/01/2013    Procedure: COLONOSCOPY WITH PROPOFOL;  Surgeon: Garlan Fair, MD;  [facility];  Service: Endoscopy;  Laterality: N/A;  . Intramedullary (im) nail intertrochanteric Left 08/16/2015    Procedure: INTRAMEDULLARY (IM) NAIL INTERTROCHANTRIC;  Surgeon: Mcarthur Rossetti, MD;  [facility];  Service: Orthopedics;  Laterality: Left;      Medication List       This list is accurate as of: 08/26/15 11:59 PM.  Always use your most recent med list.               aspirin 325 MG EC tablet  Take 1 tablet (325 mg total) by mouth daily with breakfast.     CENTRUM SILVER ADULT 50+ Tabs  Take 1 tablet by mouth daily.     cholecalciferol 1000 UNITS tablet  Commonly known as:  VITAMIN D  Take 1,000 Units by mouth daily.  CVS B-1 100 MG tablet  Generic drug:  thiamine  Take 100 mg by mouth daily.     ferrous sulfate 325 (65 FE) MG tablet  Take 325 mg by mouth daily with breakfast.     HYDROcodone-acetaminophen 5-325 MG tablet  Commonly known as:  NORCO/VICODIN  Take 1-2 tablets by mouth every 4 (four) hours as needed for moderate pain.     levETIRAcetam 500 MG tablet  Commonly known as:  KEPPRA  Take 1 tablet (500 mg total) by mouth 2 (two) times daily.     omeprazole 20 MG tablet  Commonly known as:  PRILOSEC OTC  Take 20 mg by mouth daily as needed (for heartburn).        No  orders of the defined types were placed in this encounter.    Immunization History  Administered Date(s) Administered  . Influenza,inj,Quad PF,36+ Mos 08/18/2015    Social History  Substance Use Topics  . Smoking status: Current Every Day Smoker -- 0.50 packs/day for 30 years    Types: Cigarettes  . Smokeless tobacco: Never Used  . Alcohol Use: 8.4 oz/week    14 Shots of liquor per week     Comment: 2-3 shots of liquor a day    Family history is + colon CA and HD   Review of Systems  DATA OBTAINED: from patient, nurse GENERAL:  no fevers, fatigue, appetite changes--no increased weakness from baseline SKIN: No itching, rash or wounds EYES: No eye pain, redness, chronic c/o clear d/c from eyes on and off, more R eye and clear nasal d/c. EARS: No earache, tinnitus, change in hearing NOSE: No congestion, drainage or bleeding  MOUTH/THROAT: No mouth or tooth pain, No sore throat RESPIRATORY: No cough, wheezing, SOB CARDIAC: No chest pain, palpitations, some increased left lower extremity edema  GI: No abdominal pain, No N/V/D or constipation, No heartburn or reflux  GU: No dysuria, frequency or urgency, or incontinence  MUSCULOSKELETAL: No unrelieved bone/joint pain NEUROLOGIC: No headache, dizziness or focal weakness PSYCHIATRIC: No c/o anxiety or sadness   Filed Vitals:   09/05/15 1140  BP: 127/83  Pulse: 79  Temp: 96.2 F (35.7 C)  Resp: 18    SpO2 Readings from Last 1 Encounters:  08/20/15 100%        Physical Exam  GENERAL APPEARANCE: Alert, conversant,  No acute distress.  SKIN: No diaphoresis rash HEAD: Normocephalic, atraumatic  EYES: Conjunctiva/lids clear. Pupils round, reactive. EOMs intact.  Marland Kitchen  NOSE: No deformity or discharge.  MOUTH/THROAT: Lips w/o lesions  RESPIRATORY: Breathing is even, unlabored. Lung sounds are clear   CARDIOVASCULAR: Heart RRR no murmurs, rubs or gallops. Some  peripheral edema LLE c/w hip surgery --somewhat reduced pedal  pulse there does not appear to be any acute erythema or tenderness GASTROINTESTINAL: Abdomen is soft, non-tender, not distended w/ normal bowel sounds. GENITOURINARY: Bladder non tender, not distended  MUSCULOSKELETAL: No abnormal joints or musculature lies lower extremity weakness NEUROLOGIC:  Cranial nerves 2-12 grossly intact. Moves all extremities  PSYCHIATRIC: Mood and affect appropriate to situation, no behavioral issues  Patient Active Problem List   Diagnosis Date Noted  . Edema 09/05/2015  . COPD (chronic obstructive pulmonary disease) ([place]) 08/27/2015  . Postoperative anemia due to acute blood loss 08/27/2015  . Vitamin D deficiency 08/27/2015  . Malnutrition of moderate degree 08/20/2015  . Convulsions ([place])   . Syncope and collapse 08/15/2015  . Anemia of chronic disease 08/15/2015  . Hypokalemia 08/15/2015  . Dehydration  08/15/2015  . Diarrhea 08/15/2015  . QT prolongation 08/15/2015  . Alcohol abuse 08/15/2015  . Anxiety 08/15/2015  . Syncope 08/15/2015  . Closed left hip fracture ([place])   . Seizure ([place])     CBC    Component Value Date/Time   WBC 7.0 08/19/2015 0542   WBC 2.8* 05/21/2006 1020   RBC 2.34* 08/19/2015 0542   RBC 2.29* 08/16/2015 0535   RBC 4.05* 05/21/2006 1020   HGB 8.3* 08/19/2015 0542   HGB 14.8 05/21/2006 1020   HCT 23.2* 08/19/2015 0542   HCT 41.8 05/21/2006 1020   PLT 172 08/19/2015 0542   PLT 260 05/21/2006 1020   MCV 99.1 08/19/2015 0542   MCV 103.4* 05/21/2006 1020   LYMPHSABS 0.4* 08/15/2015 1800   LYMPHSABS 1.0 05/21/2006 1020   MONOABS 0.6 08/15/2015 1800   MONOABS 0.5 05/21/2006 1020   EOSABS 0.0 08/15/2015 1800   EOSABS 0.0 05/21/2006 1020   BASOSABS 0.0 08/15/2015 1800   BASOSABS 0.0 05/21/2006 1020    CMP     Component Value Date/Time   NA 139 08/19/2015 0542   K 3.8 08/19/2015 0542   CL 108 08/19/2015 0542   CO2 23 08/19/2015 0542   GLUCOSE 101* 08/19/2015 0542   BUN 24* 08/19/2015 0542   CREATININE 1.09  08/19/2015 0542   CALCIUM 7.9* 08/19/2015 0542   PROT 6.0* 08/16/2015 0535   ALBUMIN 3.1* 08/16/2015 0535   AST 91* 08/16/2015 0535   ALT 36 08/16/2015 0535   ALKPHOS 98 08/16/2015 0535   BILITOT 1.9* 08/16/2015 0535   GFRNONAA >60 08/19/2015 0542   GFRAA >60 08/19/2015 0542    No results found for: HGBA1C   Dg C-arm 1-60 Min-no Report  08/16/2015   CLINICAL DATA: surgery   C-ARM 1-60 MINUTES  Fluoroscopy was utilized by the requesting physician.  No radiographic  interpretation.    Dg Femur Min 2 Views Left  08/16/2015   CLINICAL DATA:  Left femur fracture fixation.  EXAM: LEFT FEMUR 2 VIEWS; DG C-ARM 1-60 MIN-NO REPORT  COMPARISON:  Radiographs 08/15/2015.  FINDINGS: Nine spot fluoroscopic images are submitted. These demonstrate the sequential open reduction and internal fixation of the comminuted intertrochanteric femur fracture using a dynamic femoral neck screw and intramedullary rod. There is improved alignment of the main fracture fragments. The lesser trochanter remains medially displaced. No complications identified.  IMPRESSION: Intraoperative views during left femoral ORIF.   Electronically Signed   By: Richardean Sale M.D.   On: 08/16/2015 21:02    Not all labs, radiology exams or other studies done during hospitalization come through on my EPIC note; however they are reviewed by me.    Assessment and Plan  #1 left lower extremity edema apparently this ishas increased  slightly from baseline although this is somewhat vague-will order a venous Doppler rule out DVT-he is on aspirin for anticoagulation this was a recommendation from the hospital for DVT prophylaxis.  #2 anemia thought to be multifactorial with chronic anemia history of alcoholism-he did receive a unit of blood in the hospital hemoglobin rose to 8.3 currently 7.3 Will update this also guaiac stools 3-he has been started on iron Studies done in the hospital show  a vitamin B-12 level 458 and a folate of  8.8.  [id number]          ,

## 2015-08-27 ENCOUNTER — Encounter: Payer: Self-pay | Admitting: Internal Medicine

## 2015-08-27 DIAGNOSIS — E559 Vitamin D deficiency, unspecified: Secondary | ICD-10-CM | POA: Insufficient documentation

## 2015-08-27 DIAGNOSIS — J449 Chronic obstructive pulmonary disease, unspecified: Secondary | ICD-10-CM | POA: Insufficient documentation

## 2015-08-27 DIAGNOSIS — D62 Acute posthemorrhagic anemia: Secondary | ICD-10-CM | POA: Insufficient documentation

## 2015-08-27 NOTE — Assessment & Plan Note (Signed)
SNF - cont 1000 u vit D daily

## 2015-08-27 NOTE — Assessment & Plan Note (Signed)
acute on chronic Due to post op blood loss, dilution and Alcoholism -given 1 unit post op. Hb stable since then, it is 8.3 at discharge SNF - CBC to follow improve; cont iron suppment

## 2015-08-27 NOTE — Assessment & Plan Note (Signed)
dehydration likely due to poor oral intake in the setting of chronic alcoholism ? seizure . neuro exam was non focal. CT head: brain atrophy likely due to alcohol. No acute cardiopulmonary symptoms. Repeat EKG: unremarkable, Troponins: negative.  -ECHO with normal EF and wall motion

## 2015-08-27 NOTE — Assessment & Plan Note (Signed)
-  long history of this, seen by Neurology and started on Keppra this admission -could be a component of ETOH withdrawal too -FU with Neurology, CT head without acute findings  SNF - continue Keppra

## 2015-08-27 NOTE — Assessment & Plan Note (Signed)
Alcoholism being the chronic dx; SNF - cont iron supp

## 2015-08-27 NOTE — Assessment & Plan Note (Signed)
had mild withdrawals, now resolved, treated; SNF - cont thiamine and MVI

## 2015-08-27 NOTE — Assessment & Plan Note (Signed)
chronic tobacco use. No wheezing. CXR: no clear infiltrates; counseled to stop smoking SNF -  Cont prn bronchodilators

## 2015-08-27 NOTE — Assessment & Plan Note (Signed)
-  S/p ORIF L hip on 10/3 -PT eval completed, SNF recommended SNF - ASA only recommended for dvt prophylaxis per ortho ; ASA 325 daily, OT/PT

## 2015-08-30 ENCOUNTER — Non-Acute Institutional Stay (SKILLED_NURSING_FACILITY): Payer: Medicare Other | Admitting: Internal Medicine

## 2015-08-30 DIAGNOSIS — D638 Anemia in other chronic diseases classified elsewhere: Secondary | ICD-10-CM

## 2015-08-30 DIAGNOSIS — S72002D Fracture of unspecified part of neck of left femur, subsequent encounter for closed fracture with routine healing: Secondary | ICD-10-CM

## 2015-08-30 DIAGNOSIS — D62 Acute posthemorrhagic anemia: Secondary | ICD-10-CM

## 2015-08-30 DIAGNOSIS — R609 Edema, unspecified: Secondary | ICD-10-CM | POA: Diagnosis not present

## 2015-08-30 DIAGNOSIS — R531 Weakness: Secondary | ICD-10-CM | POA: Diagnosis not present

## 2015-08-30 NOTE — Progress Notes (Signed)
Patient ID: Wyatt Stein, male   DOB: 1934/02/15, 81 y.o.   MRN: [id number]  MRN: [id number] Name: Wyatt Stein  Sex: male Age: 81 y.o. DOB: September 27, 1934  [place] #: [facility] Facility/Room:112 Level Of Care: SNF Provider: Wille Stein Emergency Contacts: Extended Emergency Contact Information Primary Emergency Contact: Wyatt Stein, Wyatt Stein Address: [place] [address] [place] Phone: [phone] Mobile Phone: [phone] Relation: Spouse Secondary Emergency Contact: Wyatt Stein [place] of [place] Mobile Phone: [phone] Relation: Daughter  Code Status:   Allergies: Ace inhibitors and Ramipril  Chief Complaint  Patient presents with  . Acute Visit   follow-up left leg edema-anemia weakness  HPI: Patient is 80 y.o. male who PMH of HTN, COPD, chronic tobacco use, alcoholism presented with presyncope and fall and L hip fracture. Patient denied syncope/loss of consciousness , he reported having poor oral intake for a while. Pt was admitted to hospital from 10/2-6  Where he underwent ORIF . Hospital course was complicated by alcohol withdrawal and post -op anemia. Pt was admitted to SNF for generalized weaknes and for OT/PT. Whole at SNF pt  followed for alcoholism, tx with thiamine and MVI, COPD, tx with bronchodilators and Vit D def ,tx with Vit D supplementation.  There was concern about some left leg edema when I saw him several days ago venous Doppler has come back negative for DVT of the edema appears to be relatively baseline he does have some chronic edema I suspect status post his hip replacement.  He has continued weakness with therapy apparently there are issues with him cooperating with therapy-he is receiving Norco as needed for pain and this will have to be encouraged.  He also has a history of anemia thought to be chronic with history of alcoholism as well as postop anemia he did receive a unit of blood  in the hospital hemoglobin was 7.3 at is come up to 7.7 today-he is on iron and B12 and folate were within normal ranges in the hospital.  Currently he has no acute complaints  Past Medical History  Diagnosis Date  . GERD (gastroesophageal reflux disease)   . Hypertension   . Cancer [facility]) 2008    prostate, sees dr [name] once a year  . History of blood transfusion 2006  . PONV (postoperative nausea and vomiting) 40 yrs ago    aspiration pneumonia after tooth extraxtion  . COPD (chronic obstructive pulmonary disease) [facility])     Past Surgical History  Procedure Laterality Date  . Back fracture surgery  2006    screws and , back brace prn  . Seed implant for prostate cancer    . Back surgery    . Tonsillectomy    . Esophagogastroduodenoscopy (egd) with propofol N/A 07/01/2013    Procedure: ESOPHAGOGASTRODUODENOSCOPY (EGD) WITH PROPOFOL;  Surgeon: Garlan Fair, MD;  [facility]: [facility];  Service: Endoscopy;  Laterality: N/A;  . Colonoscopy with propofol N/A 07/01/2013    Procedure: COLONOSCOPY WITH PROPOFOL;  Surgeon: Garlan Fair, MD;  Location: [facility];  Service: Endoscopy;  Laterality: N/A;  . Intramedullary (im) nail intertrochanteric Left 08/16/2015    Procedure: INTRAMEDULLARY (IM) NAIL INTERTROCHANTRIC;  Surgeon: Mcarthur Rossetti, MD;  Location: [facility];  Service: Orthopedics;  Laterality: Left;      Medication List       This list is accurate as of: 08/30/15 11:59 PM.  Always use your most recent med  list.               aspirin 325 MG EC tablet  Take 1 tablet (325 mg total) by mouth daily with breakfast.     CENTRUM SILVER ADULT 50+ Tabs  Take 1 tablet by mouth daily.     cholecalciferol 1000 UNITS tablet  Commonly known as:  VITAMIN D  Take 1,000 Units by mouth daily.     CVS B-1 100 MG tablet  Generic drug:  thiamine  Take 100 mg by mouth daily.     ferrous sulfate 325 (65 FE) MG tablet  Take 325 mg by mouth daily with breakfast.      HYDROcodone-acetaminophen 5-325 MG tablet  Commonly known as:  NORCO/VICODIN  Take 1-2 tablets by mouth every 4 (four) hours as needed for moderate pain.     levETIRAcetam 500 MG tablet  Commonly known as:  KEPPRA  Take 1 tablet (500 mg total) by mouth 2 (two) times daily.     omeprazole 20 MG tablet  Commonly known as:  PRILOSEC OTC  Take 20 mg by mouth daily as needed (for heartburn).        No orders of the defined types were placed in this encounter.    Immunization History  Administered Date(s) Administered  . Influenza,inj,Quad PF,36+ Mos 08/18/2015    Social History  Substance Use Topics  . Smoking status: Current Every Day Smoker -- 0.50 packs/day for 30 years    Types: Cigarettes  . Smokeless tobacco: Never Used  . Alcohol Use: 8.4 oz/week    14 Shots of liquor per week     Comment: 2-3 shots of liquor a day    Family history is + colon CA and HD   Review of Systems  DATA OBTAINED: from patient, nurse GENERAL:  no fevers, fatigue, appetite changes--no increased weakness from baseline does have some continued weakness which at times makes therapy difficult SKIN: No itching, rash or wounds EYES: No eye pain, redness, chronic c/o clear d/c from eyes on and off, more R eye and clear nasal d/c. EARS: No earache, tinnitus, change in hearing NOSE: No congestion, drainage or bleeding  MOUTH/THROAT: No mouth or tooth pain, No sore throat RESPIRATORY: No cough, wheezing, SOB CARDIAC: No chest pain, palpitations, some increased left lower extremity edema  GI: No abdominal pain, No N/V/D or constipation, No heartburn or reflux  GU: No dysuria, frequency or urgency, or incontinence  MUSCULOSKELETAL: No unrelieved bone/joint pain is receiving Norco 5-325 milligrams every 4 hours when necessary pain NEUROLOGIC: No headache, dizziness or focal weakness PSYCHIATRIC: No c/o anxiety or sadness   Filed Vitals:   09/05/15 1520  BP: 123/88  Pulse: 91  Temp: 98 F (36.7 C)   Resp: 18    SpO2 Readings from Last 1 Encounters:  08/20/15 100%        Physical Exam  GENERAL APPEARANCE: Alert, conversant,  No acute distress. Frail-appearing  SKIN: No diaphoresis rash HEAD: Normocephalic, atraumatic  EYES: Conjunctiva/lids clear. Pupils round, reactive. EOMs intact.  Marland Kitchen  NOSE: No deformity or discharge.  MOUTH/THROAT: Lips w/o lesions oropharynx clear mucous membranes moist  RESPIRATORY: Breathing is even, unlabored. Lung sounds are clear   CARDIOVASCULAR: Heart RRR no murmurs, rubs or gallops. Some  peripheral edema LLE c/w hip surgery --somewhat reduced pedal pulse there does not appear to be any acute erythema or tenderness GASTROINTESTINAL: Abdomen is soft, non-tender, not distended w/ normal bowel sounds. GENITOURINARY: Bladder non tender, not distended  MUSCULOSKELETAL: No abnormal joints or musculature has lower extremity weakness--has some discomfort  with flexion and extension of the knee but this does not appear to be acute pain-he does have some stiffness here NEUROLOGIC:  Cranial nerves 2-12 grossly intact. Moves all extremities  PSYCHIATRIC: Mood and affect appropriate to situation, no behavioral issues  Patient Active Problem List   Diagnosis Date Noted  . Edema 09/05/2015  . COPD (chronic obstructive pulmonary disease) ([place]) 08/27/2015  . Postoperative anemia due to acute blood loss 08/27/2015  . Vitamin D deficiency 08/27/2015  . Malnutrition of moderate degree 08/20/2015  . Convulsions ([place])   . Syncope and collapse 08/15/2015  . Anemia of chronic disease 08/15/2015  . Hypokalemia 08/15/2015  . Dehydration 08/15/2015  . Diarrhea 08/15/2015  . QT prolongation 08/15/2015  . Alcohol abuse 08/15/2015  . Anxiety 08/15/2015  . Syncope 08/15/2015  . Closed left hip fracture ([place])   . Seizure ([place])      08/27/2015.  WBC 7.9 hemoglobin 7.7 platelets 465.  08/25/2015.  WBC 4.7 hemoglobin 7.3.  Sodium 139 potassium 3.6 BUN 24  creatinine 1.0  CBC    Component Value Date/Time   WBC 7.0 08/19/2015 0542   WBC 2.8* 05/21/2006 1020   RBC 2.34* 08/19/2015 0542   RBC 2.29* 08/16/2015 0535   RBC 4.05* 05/21/2006 1020   HGB 8.3* 08/19/2015 0542   HGB 14.8 05/21/2006 1020   HCT 23.2* 08/19/2015 0542   HCT 41.8 05/21/2006 1020   PLT 172 08/19/2015 0542   PLT 260 05/21/2006 1020   MCV 99.1 08/19/2015 0542   MCV 103.4* 05/21/2006 1020   LYMPHSABS 0.4* 08/15/2015 1800   LYMPHSABS 1.0 05/21/2006 1020   MONOABS 0.6 08/15/2015 1800   MONOABS 0.5 05/21/2006 1020   EOSABS 0.0 08/15/2015 1800   EOSABS 0.0 05/21/2006 1020   BASOSABS 0.0 08/15/2015 1800   BASOSABS 0.0 05/21/2006 1020    CMP     Component Value Date/Time   NA 139 08/19/2015 0542   K 3.8 08/19/2015 0542   CL 108 08/19/2015 0542   CO2 23 08/19/2015 0542   GLUCOSE 101* 08/19/2015 0542   BUN 24* 08/19/2015 0542   CREATININE 1.09 08/19/2015 0542   CALCIUM 7.9* 08/19/2015 0542   PROT 6.0* 08/16/2015 0535   ALBUMIN 3.1* 08/16/2015 0535   AST 91* 08/16/2015 0535   ALT 36 08/16/2015 0535   ALKPHOS 98 08/16/2015 0535   BILITOT 1.9* 08/16/2015 0535   GFRNONAA >60 08/19/2015 0542   GFRAA >60 08/19/2015 0542    No results found for: HGBA1C   Dg C-arm 1-60 Min-no Report  08/16/2015   CLINICAL DATA: surgery   C-ARM 1-60 MINUTES  Fluoroscopy was utilized by the requesting physician.  No radiographic  interpretation.    Dg Femur Min 2 Views Left  08/16/2015   CLINICAL DATA:  Left femur fracture fixation.  EXAM: LEFT FEMUR 2 VIEWS; DG C-ARM 1-60 MIN-NO REPORT  COMPARISON:  Radiographs 08/15/2015.  FINDINGS: Nine spot fluoroscopic images are submitted. These demonstrate the sequential open reduction and internal fixation of the comminuted intertrochanteric femur fracture using a dynamic femoral neck screw and intramedullary rod. There is improved alignment of the main fracture fragments. The lesser trochanter remains medially displaced. No complications  identified.  IMPRESSION: Intraoperative views during left femoral ORIF.   Electronically Signed   By: Richardean Sale M.D.   On: 08/16/2015 21:02    Not all labs, radiology exams or other studies done during hospitalization come through on  my EPIC note; however they are reviewed by me.    Assessment and Plan  #1 left lower extremity edema  This does not appear to have increased from previous exam venous Doppler was negative I suspect this is postop surgical edema will have to be watched do not see any sign of infection or acuity at this point but this will have to be monitored  #2 anemia thought to be multifactorial with chronic anemia history of alcoholism-he did receive a unit of blood in the hospital hemoglobin rose to 8.3 currently 7.7 --it had been as low as 7.3 I assume some lab  variation here-he has been started on iron Studies done in the hospital show  a vitamin B-12 level 458 and a folate of 8.8. Occult blood testing has been ordered- we will  update later this week.  #3 weakness-patient has been encouraged to participate in therapy I did speak with him about this-will order lab work including a metabolic panel tomorrow-I feel he will have to have significant encouragement here-his exam was relatively baseline with previous exams strength appears to be intact although again he is generally weak  [id number]          ,

## 2015-09-02 ENCOUNTER — Non-Acute Institutional Stay (SKILLED_NURSING_FACILITY): Payer: Medicare Other | Admitting: Internal Medicine

## 2015-09-02 DIAGNOSIS — R531 Weakness: Secondary | ICD-10-CM | POA: Diagnosis not present

## 2015-09-02 DIAGNOSIS — D638 Anemia in other chronic diseases classified elsewhere: Secondary | ICD-10-CM | POA: Diagnosis not present

## 2015-09-02 DIAGNOSIS — R609 Edema, unspecified: Secondary | ICD-10-CM | POA: Diagnosis not present

## 2015-09-02 NOTE — Progress Notes (Signed)
Patient ID: Wyatt Stein, male   DOB: 10-Nov-1934, 81 y.o.   MRN: [id number]   MRN: [id number] Name: Wyatt Stein  Sex: male Age: 81 y.o. DOB: August 23, 1934  [place] #: [facility] Facility/Room:112 Level Of Care: SNF Provider: Wille Stein Emergency Contacts: Extended Emergency Contact Information Primary Emergency Contact: Wyatt Stein, Wyatt Stein Address: [address] [address] [place] Phone: [phone] Mobile Phone: [phone] Relation: Spouse Secondary Emergency Contact: Wyatt Stein [place] of [place] Mobile Phone: [phone] Relation: Daughter  Code Status:   Allergies: Ace inhibitors and Ramipril  No chief complaint on file.  follow-up left leg edema-anemia weakness  HPI: Patient is 81 y.o. male who PMH of HTN, COPD, chronic tobacco use, alcoholism presented with presyncope and fall and L hip fracture. Patient denied syncope/loss of consciousness , he reported having poor oral intake for a while. Pt was admitted to hospital from 10/2-6  Where he underwent ORIF . Hospital course was complicated by alcohol withdrawal and post -op anemia. Pt was admitted to SNF for generalized weaknes and for OT/PT. Whole at SNF pt  followed for alcoholism, tx with thiamine and MVI, COPD, tx with bronchodilators and Vit D def ,tx with Vit D supplementation.  There was concern about some left leg edema when I saw him several days ago venous Doppler has come back negative for DVT of the edema appears to be relatively baseline he does have some chronic edema I suspect status post his hip replacement.  He did continue to complain of some pain we ordered an x-ray that was negative as well for any acute process    He has continued weakness with therapy  But apparently this has improved somewhat he will need to be encouraged  He also has a history of anemia thought to be chronic with history of alcoholism as well as postop anemia he did  receive a unit of blood in the hospital hemoglobin was 7.3 --this did come up to 7.7 but on lab done yesterday is back at 7.3-he is on iron and B12 and folate were within normal ranges in the hospital.  Currently he has no acute complaints--  Past Medical History  Diagnosis Date  . GERD (gastroesophageal reflux disease)   . Hypertension   . Cancer [facility]) 2008    prostate, sees dr [name] once a year  . History of blood transfusion 2006  . PONV (postoperative nausea and vomiting) 40 yrs ago    aspiration pneumonia after tooth extraxtion  . COPD (chronic obstructive pulmonary disease) [facility])     Past Surgical History  Procedure Laterality Date  . Back fracture surgery  2006    screws and , back brace prn  . Seed implant for prostate cancer    . Back surgery    . Tonsillectomy    . Esophagogastroduodenoscopy (egd) with propofol N/A 07/01/2013    Procedure: ESOPHAGOGASTRODUODENOSCOPY (EGD) WITH PROPOFOL;  Surgeon: Garlan Fair, MD;  [facility];  Service: Endoscopy;  Laterality: N/A;  . Colonoscopy with propofol N/A 07/01/2013    Procedure: COLONOSCOPY WITH PROPOFOL;  Surgeon: Garlan Fair, MD;  [facility];  Service: Endoscopy;  Laterality: N/A;  . Intramedullary (im) nail intertrochanteric Left 08/16/2015    Procedure: INTRAMEDULLARY (IM) NAIL INTERTROCHANTRIC;  Surgeon: Mcarthur Rossetti, MD;  [facility];  Service: Orthopedics;  Laterality: Left;      Medication List  This list is accurate as of: 09/02/15 11:59 PM.  Always use your most recent med list.               aspirin 325 MG EC tablet  Take 1 tablet (325 mg total) by mouth daily with breakfast.     CENTRUM SILVER ADULT 50+ Tabs  Take 1 tablet by mouth daily.     cholecalciferol 1000 UNITS tablet  Commonly known as:  VITAMIN D  Take 1,000 Units by mouth daily.     CVS B-1 100 MG tablet  Generic drug:  thiamine  Take 100 mg by mouth daily.     ferrous sulfate 325 (65 FE)  MG tablet  Take 325 mg by mouth daily with breakfast.     HYDROcodone-acetaminophen 5-325 MG tablet  Commonly known as:  NORCO/VICODIN  Take 1-2 tablets by mouth every 4 (four) hours as needed for moderate pain.     levETIRAcetam 500 MG tablet  Commonly known as:  KEPPRA  Take 1 tablet (500 mg total) by mouth 2 (two) times daily.     omeprazole 20 MG tablet  Commonly known as:  PRILOSEC OTC  Take 20 mg by mouth daily as needed (for heartburn).        No orders of the defined types were placed in this encounter.    Immunization History  Administered Date(s) Administered  . Influenza,inj,Quad PF,36+ Mos 08/18/2015    Social History  Substance Use Topics  . Smoking status: Current Every Day Smoker -- 0.50 packs/day for 30 years    Types: Cigarettes  . Smokeless tobacco: Never Used  . Alcohol Use: 8.4 oz/week    14 Shots of liquor per week     Comment: 2-3 shots of liquor a day    Family history is + colon CA and HD   Review of Systems  DATA OBTAINED: from patient, nurse GENERAL:  no fevers, fatigue, appetite changes--no increased weakness from baseline does have some continued weakness wh SKIN: No itching, rash or wounds EYES: No eye pain, redness, chronic c/o clear d/c from eyes on and off, more R eye and clear nasal d/c. EARS: No earache, tinnitus, change in hearing NOSE: No congestion, drainage or bleeding  MOUTH/THROAT: No mouth or tooth pain, No sore throat RESPIRATORY: No cough, wheezing, SOB CARDIAC: No chest pain, palpitations, some increased left lower extremity edema  GI: No abdominal pain, No N/V/D or constipation, No heartburn or reflux  GU: No dysuria, frequency or urgency, or incontinence  MUSCULOSKELETAL: No unrelieved bone/joint pain is receiving Norco 5-325 milligrams every 4 hours when necessary pain NEUROLOGIC: No headache, dizziness or focal weakness PSYCHIATRIC: No c/o anxiety or sadness   Filed Vitals:   09/10/15 2009  BP: 137/64  Pulse: 70   Temp: 98.2 F (36.8 C)  Resp: 20    SpO2 Readings from Last 1 Encounters:  08/20/15 100%        Physical Exam  GENERAL APPEARANCE: Alert, conversant,  No acute distress. Frail-appearing  SKIN: No diaphoresis rash HEAD: Normocephalic, atraumatic  EYES: Conjunctiva/lids clear. Pupils round, reactive. EOMs intact.  Marland Kitchen  NOSE: No deformity or discharge.  MOUTH/THROAT: Lips w/o lesions oropharynx clear mucous membranes moist  RESPIRATORY: Breathing is even, unlabored. Lung sounds are clear   CARDIOVASCULAR: Heart RRR no murmurs, rubs or gallops. Some  peripheral edema LLE c/w hip surgery --somewhat reduced pedal pulse there does not appear to be any acute erythema or tenderness--this appears relatively baseline with previous exam edema  has not increased GASTROINTESTINAL: Abdomen is soft, non-tender, not distended w/ normal bowel sounds. GENITOURINARY: Bladder non tender, not distended  MUSCULOSKELETAL: No abnormal joints or musculature has lower extremity weakness--has some discomfort  with flexion and extension of the knee but this does not appear to be acute pain-he does have some stiffness here NEUROLOGIC:  Cranial nerves 2-12 grossly intact. Moves all extremities  PSYCHIATRIC: Mood and affect appropriate to situation, no behavioral issues  Patient Active Problem List   Diagnosis Date Noted  . Edema 09/05/2015  . COPD (chronic obstructive pulmonary disease) ([place]) 08/27/2015  . Postoperative anemia due to acute blood loss 08/27/2015  . Vitamin D deficiency 08/27/2015  . Malnutrition of moderate degree 08/20/2015  . Convulsions ([place])   . Syncope and collapse 08/15/2015  . Anemia of chronic disease 08/15/2015  . Hypokalemia 08/15/2015  . Dehydration 08/15/2015  . Diarrhea 08/15/2015  . QT prolongation 08/15/2015  . Alcohol abuse 08/15/2015  . Anxiety 08/15/2015  . Syncope 08/15/2015  . Closed left hip fracture ([place])   . Seizure ([place])      Labs.  08/31/2015.  Sodium  139 potassium 4 BUN 20 creatinine 1.1.  Albumin 2.5-alkaline phosphatase 167.  Otherwise liver function tests within normal limits.  WBC 5.3 hemoglobin 7.3 platelets 441.  MCV--103.9  08/27/2015.  WBC 7.9 hemoglobin 7.7 platelets 465.  08/25/2015.  WBC 4.7 hemoglobin 7.3.  Sodium 139 potassium 3.6 BUN 24 creatinine 1.0  CBC    Component Value Date/Time   WBC 7.0 08/19/2015 0542   WBC 2.8* 05/21/2006 1020   RBC 2.34* 08/19/2015 0542   RBC 2.29* 08/16/2015 0535   RBC 4.05* 05/21/2006 1020   HGB 8.3* 08/19/2015 0542   HGB 14.8 05/21/2006 1020   HCT 23.2* 08/19/2015 0542   HCT 41.8 05/21/2006 1020   PLT 172 08/19/2015 0542   PLT 260 05/21/2006 1020   MCV 99.1 08/19/2015 0542   MCV 103.4* 05/21/2006 1020   LYMPHSABS 0.4* 08/15/2015 1800   LYMPHSABS 1.0 05/21/2006 1020   MONOABS 0.6 08/15/2015 1800   MONOABS 0.5 05/21/2006 1020   EOSABS 0.0 08/15/2015 1800   EOSABS 0.0 05/21/2006 1020   BASOSABS 0.0 08/15/2015 1800   BASOSABS 0.0 05/21/2006 1020    CMP     Component Value Date/Time   NA 139 08/19/2015 0542   K 3.8 08/19/2015 0542   CL 108 08/19/2015 0542   CO2 23 08/19/2015 0542   GLUCOSE 101* 08/19/2015 0542   BUN 24* 08/19/2015 0542   CREATININE 1.09 08/19/2015 0542   CALCIUM 7.9* 08/19/2015 0542   PROT 6.0* 08/16/2015 0535   ALBUMIN 3.1* 08/16/2015 0535   AST 91* 08/16/2015 0535   ALT 36 08/16/2015 0535   ALKPHOS 98 08/16/2015 0535   BILITOT 1.9* 08/16/2015 0535   GFRNONAA >60 08/19/2015 0542   GFRAA >60 08/19/2015 0542    No results found for: HGBA1C   Dg C-arm 1-60 Min-no Report  08/16/2015   CLINICAL DATA: surgery   C-ARM 1-60 MINUTES  Fluoroscopy was utilized by the requesting physician.  No radiographic  interpretation.    Dg Femur Min 2 Views Left  08/16/2015   CLINICAL DATA:  Left femur fracture fixation.  EXAM: LEFT FEMUR 2 VIEWS; DG C-ARM 1-60 MIN-NO REPORT  COMPARISON:  Radiographs 08/15/2015.  FINDINGS: Nine spot fluoroscopic images are  submitted. These demonstrate the sequential open reduction and internal fixation of the comminuted intertrochanteric femur fracture using a dynamic femoral neck screw and intramedullary rod. There is improved  alignment of the main fracture fragments. The lesser trochanter remains medially displaced. No complications identified.  IMPRESSION: Intraoperative views during left femoral ORIF.   Electronically Signed   By: Richardean Sale M.D.   On: 08/16/2015 21:02    Not all labs, radiology exams or other studies done during hospitalization come through on my EPIC note; however they are reviewed by me.    Assessment and Plan  #1 left lower extremity edema  This does not appear to have increased from previous exams venous Doppler was negative I  X-ray also has come back negative for any acute process  #2 anemia thought to be multifactorial with chronic anemia history of alcoholism-he did receive a unit of blood in the hospital hemoglobin rose to 8.3 -most recent levels 7.7-and 7.3-I suspect there will continue to be some variation-occult blood testing has been ordered so far no reports of positive readings -he has been started on iron Studies done in the hospital show  a vitamin B-12 level 458 and a folate of 8.8. He is on thiamine--- at this point will continue to monitor  #3 weakness-patient has been encouraged to participate in therapy  Believe he is doing a bit better in this regards.    [id number]          ,

## 2015-09-05 ENCOUNTER — Encounter: Payer: Self-pay | Admitting: Internal Medicine

## 2015-09-05 DIAGNOSIS — R609 Edema, unspecified: Secondary | ICD-10-CM | POA: Insufficient documentation

## 2015-09-10 ENCOUNTER — Encounter: Payer: Self-pay | Admitting: Internal Medicine

## 2015-09-17 ENCOUNTER — Encounter: Payer: Self-pay | Admitting: Internal Medicine

## 2015-09-17 ENCOUNTER — Non-Acute Institutional Stay (SKILLED_NURSING_FACILITY): Payer: Medicare Other | Admitting: Internal Medicine

## 2015-09-17 DIAGNOSIS — J439 Emphysema, unspecified: Secondary | ICD-10-CM | POA: Diagnosis not present

## 2015-09-17 DIAGNOSIS — R55 Syncope and collapse: Secondary | ICD-10-CM | POA: Diagnosis not present

## 2015-09-17 DIAGNOSIS — D62 Acute posthemorrhagic anemia: Secondary | ICD-10-CM

## 2015-09-17 DIAGNOSIS — F1010 Alcohol abuse, uncomplicated: Secondary | ICD-10-CM

## 2015-09-17 DIAGNOSIS — R569 Unspecified convulsions: Secondary | ICD-10-CM

## 2015-09-17 DIAGNOSIS — S72002D Fracture of unspecified part of neck of left femur, subsequent encounter for closed fracture with routine healing: Secondary | ICD-10-CM | POA: Diagnosis not present

## 2015-09-17 DIAGNOSIS — E559 Vitamin D deficiency, unspecified: Secondary | ICD-10-CM

## 2015-09-17 DIAGNOSIS — D638 Anemia in other chronic diseases classified elsewhere: Secondary | ICD-10-CM

## 2015-09-17 NOTE — Progress Notes (Signed)
MRN: [id number] Name: Wyatt Stein  Sex: male Age: 81 y.o. DOB: [birth year]  [facility]: [facility] Facility/Room:112 Level Of Care: SNF Provider: Inocencio Homes D Emergency Contacts: Extended Emergency Contact Information Primary Emergency Contact: Wyatt Stein, Wyatt Stein Address: [place] [address] [name] of [place] Phone: [phone] Mobile Phone: [phone] Relation: Spouse Secondary Emergency Contact: Wyatt Stein States of [place] Mobile Phone: [phone] Relation: Daughter  Code Status:   Allergies: Ace inhibitors and Ramipril  Chief Complaint  Patient presents with  . Discharge Note    HPI: Patient is 81 y.o. male with PMH of HTN, COPD, chronic tobacco use, alcoholism presented with presyncope and fall and L hip fracture. Pt was admitted to SNF after surgery for OT/PT. Pt is now ready to be d/c to home.While here  pt had some L leg edema, U/S was done, neg for clot.  Past Medical History  Diagnosis Date  . GERD (gastroesophageal reflux disease)   . Hypertension   . Cancer [facility]) 2008    prostate, sees dr [name] once a year  . History of blood transfusion 2006  . PONV (postoperative nausea and vomiting) 40 yrs ago    aspiration pneumonia after tooth extraxtion  . COPD (chronic obstructive pulmonary disease) [facility])     Past Surgical History  Procedure Laterality Date  . Back fracture surgery  2006    screws and , back brace prn  . Seed implant for prostate cancer    . Back surgery    . Tonsillectomy    . Esophagogastroduodenoscopy (egd) with propofol N/A 07/01/2013    Procedure: ESOPHAGOGASTRODUODENOSCOPY (EGD) WITH PROPOFOL;  Surgeon: Garlan Fair, MD;  Location: [facility];  Service: Endoscopy;  Laterality: N/A;  . Colonoscopy with propofol N/A 07/01/2013    Procedure: COLONOSCOPY WITH PROPOFOL;  Surgeon: Garlan Fair, MD;  Location: [facility];  Service: Endoscopy;  Laterality: N/A;  .  Intramedullary (im) nail intertrochanteric Left 08/16/2015    Procedure: INTRAMEDULLARY (IM) NAIL INTERTROCHANTRIC;  Surgeon: Mcarthur Rossetti, MD;  Location: [facility];  Service: Orthopedics;  Laterality: Left;      Medication List       This list is accurate as of: 09/17/15 12:22 PM.  Always use your most recent med list.               aspirin 325 MG EC tablet  Take 1 tablet (325 mg total) by mouth daily with breakfast.     CENTRUM SILVER ADULT 50+ Tabs  Take 1 tablet by mouth daily.     cholecalciferol 1000 UNITS tablet  Commonly known as:  VITAMIN D  Take 1,000 Units by mouth daily.     CVS B-1 100 MG tablet  Generic drug:  thiamine  Take 100 mg by mouth daily.     ferrous sulfate 325 (65 FE) MG tablet  Take 325 mg by mouth daily with breakfast.     HYDROcodone-acetaminophen 5-325 MG tablet  Commonly known as:  NORCO/VICODIN  Take 1-2 tablets by mouth every 4 (four) hours as needed for moderate pain.     levETIRAcetam 500 MG tablet  Commonly known as:  KEPPRA  Take 1 tablet (500 mg total) by mouth 2 (two) times daily.     loratadine 10 MG tablet  Commonly known as:  CLARITIN  Take 10 mg by mouth daily.     omeprazole 20 MG tablet  Commonly known as:  PRILOSEC OTC  Take 20 mg by mouth daily as needed (for heartburn).        Meds ordered this encounter  Medications  . loratadine (CLARITIN) 10 MG tablet    Sig: Take 10 mg by mouth daily.    Immunization History  Administered Date(s) Administered  . Influenza,inj,Quad PF,36+ Mos 08/18/2015    Social History  Substance Use Topics  . Smoking status: Current Every Day Smoker -- 0.50 packs/day for 30 years    Types: Cigarettes  . Smokeless tobacco: Never Used  . Alcohol Use: 8.4 oz/week    14 Shots of liquor per week     Comment: 2-3 shots of liquor a day    Filed Vitals:   09/17/15 1129  BP: 138/70  Pulse: 84  Temp: 97.2 F (36.2 C)  Resp: 20    Physical Exam  GENERAL APPEARANCE: Alert,  conversant. No acute distress.  HEENT: Unremarkable. RESPIRATORY: Breathing is even, unlabored. Lung sounds are clear   CARDIOVASCULAR: Heart RRR no murmurs, rubs or gallops. Trace B peripheral edema.  GASTROINTESTINAL: Abdomen is soft, non-tender, not distended w/ normal bowel sounds.  NEUROLOGIC: Cranial nerves 2-12 grossly intact. Moves all extremities  Patient Active Problem List   Diagnosis Date Noted  . Edema 09/05/2015  . COPD (chronic obstructive pulmonary disease) ([place]) 08/27/2015  . Postoperative anemia due to acute blood loss 08/27/2015  . Vitamin D deficiency 08/27/2015  . Malnutrition of moderate degree 08/20/2015  . Convulsions ([place])   . Syncope and collapse 08/15/2015  . Anemia of chronic disease 08/15/2015  . Hypokalemia 08/15/2015  . Dehydration 08/15/2015  . Diarrhea 08/15/2015  . QT prolongation 08/15/2015  . Alcohol abuse 08/15/2015  . Anxiety 08/15/2015  . Syncope 08/15/2015  . Closed left hip fracture ([place])   . Seizure ([place])     CBC    Component Value Date/Time   WBC 7.0 08/19/2015 0542   WBC 2.8* 05/21/2006 1020   RBC 2.34* 08/19/2015 0542   RBC 2.29* 08/16/2015 0535   RBC 4.05* 05/21/2006 1020   HGB 8.3* 08/19/2015 0542   HGB 14.8 05/21/2006 1020   HCT 23.2* 08/19/2015 0542   HCT 41.8 05/21/2006 1020   PLT 172 08/19/2015 0542   PLT 260 05/21/2006 1020   MCV 99.1 08/19/2015 0542   MCV 103.4* 05/21/2006 1020   LYMPHSABS 0.4* 08/15/2015 1800   LYMPHSABS 1.0 05/21/2006 1020   MONOABS 0.6 08/15/2015 1800   MONOABS 0.5 05/21/2006 1020   EOSABS 0.0 08/15/2015 1800   EOSABS 0.0 05/21/2006 1020   BASOSABS 0.0 08/15/2015 1800   BASOSABS 0.0 05/21/2006 1020    CMP     Component Value Date/Time   NA 139 08/19/2015 0542   K 3.8 08/19/2015 0542   CL 108 08/19/2015 0542   CO2 23 08/19/2015 0542   GLUCOSE 101* 08/19/2015 0542   BUN 24* 08/19/2015 0542   CREATININE 1.09 08/19/2015 0542   CALCIUM 7.9* 08/19/2015 0542   PROT 6.0* 08/16/2015 0535    ALBUMIN 3.1* 08/16/2015 0535   AST 91* 08/16/2015 0535   ALT 36 08/16/2015 0535   ALKPHOS 98 08/16/2015 0535   BILITOT 1.9* 08/16/2015 0535   GFRNONAA >60 08/19/2015 0542   GFRAA >60 08/19/2015 0542    Assessment and Plan  Pt is d/c to home with HH/OT/PT/nursing. Rx's have been written.  Time spent 35 min;> 50% of time with patient was spent reviewing records, labs, tests and studies, counseling and developing plan of care  Hennie Duos, MD

## 2015-09-27 DIAGNOSIS — S72142D Displaced intertrochanteric fracture of left femur, subsequent encounter for closed fracture with routine healing: Secondary | ICD-10-CM | POA: Diagnosis not present

## 2015-10-20 DIAGNOSIS — E440 Moderate protein-calorie malnutrition: Secondary | ICD-10-CM | POA: Diagnosis not present

## 2015-10-20 DIAGNOSIS — R569 Unspecified convulsions: Secondary | ICD-10-CM | POA: Diagnosis not present

## 2015-10-21 ENCOUNTER — Other Ambulatory Visit: Payer: Self-pay | Admitting: Internal Medicine

## 2015-10-25 ENCOUNTER — Other Ambulatory Visit: Payer: Self-pay | Admitting: Internal Medicine

## 2015-11-01 ENCOUNTER — Ambulatory Visit (INDEPENDENT_AMBULATORY_CARE_PROVIDER_SITE_OTHER): Payer: Medicare Other | Admitting: Neurology

## 2015-11-01 ENCOUNTER — Encounter: Payer: Self-pay | Admitting: Neurology

## 2015-11-01 VITALS — BP 120/86 | HR 84 | Wt 133.0 lb

## 2015-11-01 DIAGNOSIS — F1010 Alcohol abuse, uncomplicated: Secondary | ICD-10-CM | POA: Diagnosis not present

## 2015-11-01 DIAGNOSIS — G40009 Localization-related (focal) (partial) idiopathic epilepsy and epileptic syndromes with seizures of localized onset, not intractable, without status epilepticus: Secondary | ICD-10-CM | POA: Insufficient documentation

## 2015-11-01 NOTE — Progress Notes (Signed)
NEUROLOGY CONSULTATION NOTE  SHONTE SODERLUND MRN: [id number] DOB: 1934-03-18  Referring provider: Dr. Wenda Low Primary care provider: Dr. Wenda Low  Reason for consult:  seizures  Dear Dr Lysle Rubens:  Thank you for your kind referral of Wyatt Stein for consultation of the above symptoms. Although his history is well known to you, please allow me to reiterate it for the purpose of our medical record. The patient was accompanied to the clinic by his wife who also provides collateral information. Records and images were personally reviewed where available.  HISTORY OF PRESENT ILLNESS: This is a 81 year old right-handed man with a history of hypertension, prostate cancer, alcohol abuse, sober since October 2016, presenting for evaluation of seizures. 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 His wife has witnessed the episodes and provides details on the history. Records from neurologist Dr. [name] who he had seen in 2014 were reviewed as well. It appears he started having recurrent episodes of loss of awareness/consciousness in 2012 or so.  His wife describes him to look like he is getting ready to sleep with head slumping forward, followed by violent shaking with stiffness for a few minutes. In the past, she has described his left arm to be extended and stiff. In the past, she reported he would be confused and out of it for 5-10 minutes after, but today reports that he is lethargic afterward but not confused. He looks like he wants to vomit after. He denies these episodes occur, but does report that when his wife tells him he had a seizure, it is because his stomach becomes upset, "always starts from my stomach," then after he needs to have a bowel movement. Per wife, he has had urinary and bowel incontinence with these episodes. They mostly occur when sitting, usually early in the morning, but his wife feels that the fall last October leading to his left hip  fracture was due to a seizure because she found him with foam around his mouth. 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 He has had 2 routine EEGs, normal wake EEG in 08/2013 and normal awake and drowsy EEG in 08/2015. He was evaluated by Neurology at that time and discharged home on Keppra '500mg'$  BID.   His wife reports that she had seen similar episodes when he was consuming large amounts of alcohol, but has witnessed 4 more similar spells since October 2016 and they both report he has been sober since then. In the past, she has witnessed him have similar shaking episodes but he would be more alert and talking, but that he had some alcohol at that time. She reports that prior to October 2016, she had seen around 3-4 episodes over a period of 6-7 months. Since the last shaking episode on 10/20/15, Keppra dose was increased by his PCP to '750mg'$  BID, no episodes since then. He is tolerating medication without any side effects. His wife reports that after his hospitalization/SNF stay, his memory is much better, however he is less talkative, hence she does not know how he is feeling. He feels she is being overprotective. She is worried he will start drinking again.   He denies any headaches, dizziness, diplopia, dysarthria, dysphagia, neck/back pain, bowel/bladder dysfunction. He has occasional pain in his left hip. He has occasional paresthesias and cold sensation  in both feet. He denies any olfactory/gustatory hallucinations, deja vu, myoclonic jerks. He had a normal birth and early development.  There is no history of febrile convulsions, CNS infections such as meningitis/encephalitis, significant traumatic brain injury, neurosurgical procedures, or family history of seizures. He has 2 uncles who had tremors.   Laboratory Data:  Lab Results    Component Value Date   WBC 7.0 08/19/2015   HGB 8.3* 08/19/2015   HCT 23.2* 08/19/2015   MCV 99.1 08/19/2015   PLT 172 08/19/2015     Chemistry      Component Value Date/Time   NA 139 08/19/2015 0542   K 3.8 08/19/2015 0542   CL 108 08/19/2015 0542   CO2 23 08/19/2015 0542   BUN 24* 08/19/2015 0542   CREATININE 1.09 08/19/2015 0542      Component Value Date/Time   CALCIUM 7.9* 08/19/2015 0542   ALKPHOS 98 08/16/2015 0535   AST 91* 08/16/2015 0535   ALT 36 08/16/2015 0535   BILITOT 1.9* 08/16/2015 0535       PAST MEDICAL HISTORY: Past Medical History  Diagnosis Date  . GERD (gastroesophageal reflux disease)   . Hypertension   . Cancer [facility]) 2008    prostate, sees dr [name] once a year  . History of blood transfusion 2006  . PONV (postoperative nausea and vomiting) 40 yrs ago    aspiration pneumonia after tooth extraxtion  . COPD (chronic obstructive pulmonary disease) ([place])     PAST SURGICAL HISTORY: Past Surgical History  Procedure Laterality Date  . Back fracture surgery  Lumbar 2006    screws and , back brace prn  . Seed implant for prostate cancer    . Back surgery    . Tonsillectomy    . Esophagogastroduodenoscopy (egd) with propofol N/A 07/01/2013    Procedure: ESOPHAGOGASTRODUODENOSCOPY (EGD) WITH PROPOFOL;  Surgeon: Garlan Fair, MD;  [facility];  Service: Endoscopy;  Laterality: N/A;  . Colonoscopy with propofol N/A 07/01/2013    Procedure: COLONOSCOPY WITH PROPOFOL;  Surgeon: Garlan Fair, MD;  [facility];  Service: Endoscopy;  Laterality: N/A;  . Intramedullary (im) nail intertrochanteric Left 08/16/2015    Procedure: INTRAMEDULLARY (IM) NAIL INTERTROCHANTRIC;  Surgeon: Mcarthur Rossetti, MD;  [facility];  Service: Orthopedics;  Laterality: Left;    MEDICATIONS: Current Outpatient Prescriptions on File Prior to Visit  Medication Sig Dispense Refill  . cholecalciferol (VITAMIN D) 1000 UNITS tablet Take 1,000  Units by mouth daily.    . CVS B-1 100 MG tablet Take 100 mg by mouth daily.  11  . ferrous sulfate 325 (65 FE) MG tablet Take 325 mg by mouth daily with breakfast.    . Multiple Vitamins-Minerals (CENTRUM SILVER ADULT 50+) TABS Take 1 tablet by mouth daily.    Marland Kitchen omeprazole (PRILOSEC OTC) 20 MG tablet Take 20 mg by mouth daily.     Keppra '750mg'$  BID No current facility-administered medications on file prior to visit.    ALLERGIES: Allergies  Allergen Reactions  . Ace Inhibitors Anaphylaxis and Swelling  . Ramipril Anaphylaxis and Itching    FAMILY HISTORY: Family History  Problem Relation Age of Onset  . Colon cancer Father   . Heart Problems Mother     SOCIAL HISTORY: Social History   Social History  . Marital Status: Married    Spouse Name: [name]   . Number of Children: 2  . Years of Education: College    Occupational History  . Retired  Social History Main Topics  . Smoking status: Current Every Day Smoker -- 0.50 packs/day for 30 years    Types: Cigarettes  . Smokeless tobacco: Never Used  . Alcohol Use: No     Comment: Former--pt states none since Oct  . Drug Use: No  . Sexual Activity: Yes    Birth Control/ Protection: None   Other Topics Concern  . Not on file   Social History Narrative   Patient lives at home with wife.   Patient is retired.    Patient is a college grad.    Patient has 2 children.     REVIEW OF SYSTEMS: Constitutional: No fevers, chills, or sweats, no generalized fatigue, change in appetite Eyes: No visual changes, double vision, eye pain Ear, nose and throat: No hearing loss, ear pain, nasal congestion, sore throat Cardiovascular: No chest pain, palpitations Respiratory:  No shortness of breath at rest or with exertion, wheezes GastrointestinaI: No nausea, vomiting, diarrhea, abdominal pain, fecal incontinence Genitourinary:  No dysuria, urinary retention or frequency Musculoskeletal:  No neck pain, back pain Integumentary:  No rash, pruritus, skin lesions Neurological: as above Psychiatric: No depression, insomnia, anxiety Endocrine: No palpitations, fatigue, diaphoresis, mood swings, change in appetite, change in weight, increased thirst Hematologic/Lymphatic:  No anemia, purpura, petechiae. Allergic/Immunologic: no itchy/runny eyes, nasal congestion, recent allergic reactions, rashes  PHYSICAL EXAM: Filed Vitals:   11/01/15 0858  BP: 120/86  Pulse: 84   General: No acute distress Head:  Normocephalic/atraumatic Eyes: Fundoscopic exam shows bilateral sharp discs, no vessel changes, exudates, or hemorrhages Neck: supple, no paraspinal tenderness, full range of motion Back: No paraspinal tenderness Heart: regular rate and rhythm Lungs: Clear to auscultation bilaterally. Vascular: No carotid bruits. Skin/Extremities: No rash, no edema Neurological Exam: Mental status: alert and oriented to person, place, and time, no dysarthria or aphasia, Fund of knowledge is appropriate.  Remote memoryinttact.  Attention and concentration are normal.    Able to name objects and repeat phrases. CDT 5/5.  MMSE - Mini Mental State Exam 11/01/2015  Orientation to time 5  Orientation to Place 5  Registration 3  Attention/ Calculation 5  Recall 1  Language- name 2 objects 2  Language- repeat 1  Language- follow 3 step command 3  Language- read & follow direction 1  Write a sentence 1  Copy design 1  Total score 28   Cranial nerves: CN I: not tested CN II: pupils equal, round and reactive to light, visual fields intact, fundi unremarkable. CN III, IV, VI:  full range of motion, no nystagmus, no ptosis CN V: facial sensation intact CN VII: upper and lower face symmetric CN VIII: hearing intact to finger rub CN IX, X: gag intact, uvula midline CN XI: sternocleidomastoid and trapezius muscles intact CN XII: tongue midline Bulk & Tone: normal, no fasciculations. Motor: 5/5 throughout with no pronator  drift. Sensation: intact to light touch, cold, pin, vibration and joint position sense.  No extinction to double simultaneous stimulation.  Romberg test negative Deep Tendon Reflexes: +2 throughout, no ankle clonus Plantar responses: downgoing bilaterally Cerebellar: no incoordination on finger to nose, heel to shin. No dysdiadochokinesia Gait: narrow-based and steady, able to tandem walk adequately. 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 His wife reports episodes of shaking with left arm extension. Neurological exam normal,  MMSE today 28/30. MRI brain showed moderate atrophy, including in bilateral hippocampi, EEG x 2 normal. Keppra dose has been increased to '750mg'$  BID a week or so ago. Continue on current dose of Keppra. We discussed that if symptoms continue, he will be scheduled for a 48-hour EEG to further classify his symptoms. We also discussed continued avoidance of alcohol and avoidance of other seizure triggers, including missing medications and sleep deprivation. We discussed consideration for joining AA, which he refuses at this time. He is aware of [place] driving laws to stop driving after a seizure, until 6 months seizure-free. He will follow-up in 3 months and knows to call for any changes.   Thank you for allowing me to participate in the care of this patient. Please do not hesitate to call for any questions or concerns.   Ellouise Newer, M.D.  CC: Dr. Lysle Rubens

## 2015-11-01 NOTE — Patient Instructions (Signed)
1. Continue Keppra '750mg'$  twice a day 2. Continue with alcohol avoidance 3. Follow-up in 3 months, call for any changes in symptoms  Seizure Precautions: 1. If medication has been prescribed for you to prevent seizures, take it exactly as directed.  Do not stop taking the medicine without talking to your doctor first, even if you have not had a seizure in a long time.   2. Avoid activities in which a seizure would cause danger to yourself or to others.  Don't operate dangerous machinery, swim alone, or climb in high or dangerous places, such as on ladders, roofs, or girders.  Do not drive unless your doctor says you may.  3. If you have any warning that you may have a seizure, lay down in a safe place where you can't hurt yourself.    4.  No driving for 6 months from last seizure, as per [facility].   Please refer to the following link on the [place] website for more information: http://[email or web address].org/[email or web address]/driving/drivingu.cfm   5.  Maintain good sleep hygiene. Avoid alcohol.  6.  Contact your doctor if you have any problems that may be related to the medicine you are taking.  7.  Call 911 and bring the patient back to the ED if:        A.  The seizure lasts longer than 5 minutes.       B.  The patient doesn't awaken shortly after the seizure  C.  The patient has new problems such as difficulty seeing, speaking or moving  D.  The patient was injured during the seizure  E.  The patient has a temperature over 102 F (39C)  F.  The patient vomited and now is having trouble breathing

## 2015-11-16 DIAGNOSIS — M79675 Pain in left toe(s): Secondary | ICD-10-CM | POA: Diagnosis not present

## 2015-11-16 DIAGNOSIS — M79674 Pain in right toe(s): Secondary | ICD-10-CM | POA: Diagnosis not present

## 2015-11-16 DIAGNOSIS — B351 Tinea unguium: Secondary | ICD-10-CM | POA: Diagnosis not present

## 2015-11-18 ENCOUNTER — Other Ambulatory Visit: Payer: Self-pay | Admitting: Internal Medicine

## 2015-11-23 DIAGNOSIS — M792 Neuralgia and neuritis, unspecified: Secondary | ICD-10-CM | POA: Diagnosis not present

## 2015-11-23 DIAGNOSIS — C61 Malignant neoplasm of prostate: Secondary | ICD-10-CM | POA: Diagnosis not present

## 2015-11-23 DIAGNOSIS — B351 Tinea unguium: Secondary | ICD-10-CM | POA: Diagnosis not present

## 2015-11-23 DIAGNOSIS — M79671 Pain in right foot: Secondary | ICD-10-CM | POA: Diagnosis not present

## 2015-11-23 DIAGNOSIS — M79672 Pain in left foot: Secondary | ICD-10-CM | POA: Diagnosis not present

## 2015-11-29 DIAGNOSIS — Z8546 Personal history of malignant neoplasm of prostate: Secondary | ICD-10-CM | POA: Diagnosis not present

## 2015-11-29 DIAGNOSIS — Z0000 Encounter for general adult medical examination without abnormal findings: Secondary | ICD-10-CM | POA: Diagnosis not present

## 2015-11-30 DIAGNOSIS — H401232 Low-tension glaucoma, bilateral, moderate stage: Secondary | ICD-10-CM | POA: Diagnosis not present

## 2015-11-30 DIAGNOSIS — H2513 Age-related nuclear cataract, bilateral: Secondary | ICD-10-CM | POA: Diagnosis not present

## 2015-12-21 DIAGNOSIS — M79609 Pain in unspecified limb: Secondary | ICD-10-CM | POA: Diagnosis not present

## 2015-12-21 DIAGNOSIS — B351 Tinea unguium: Secondary | ICD-10-CM | POA: Diagnosis not present

## 2016-01-18 DIAGNOSIS — M79609 Pain in unspecified limb: Secondary | ICD-10-CM | POA: Diagnosis not present

## 2016-01-18 DIAGNOSIS — B351 Tinea unguium: Secondary | ICD-10-CM | POA: Diagnosis not present

## 2016-01-19 DIAGNOSIS — F17210 Nicotine dependence, cigarettes, uncomplicated: Secondary | ICD-10-CM | POA: Diagnosis not present

## 2016-01-19 DIAGNOSIS — D649 Anemia, unspecified: Secondary | ICD-10-CM | POA: Diagnosis not present

## 2016-01-19 DIAGNOSIS — J449 Chronic obstructive pulmonary disease, unspecified: Secondary | ICD-10-CM | POA: Diagnosis not present

## 2016-01-19 DIAGNOSIS — E7800 Pure hypercholesterolemia, unspecified: Secondary | ICD-10-CM | POA: Diagnosis not present

## 2016-01-19 DIAGNOSIS — R569 Unspecified convulsions: Secondary | ICD-10-CM | POA: Diagnosis not present

## 2016-01-19 DIAGNOSIS — C61 Malignant neoplasm of prostate: Secondary | ICD-10-CM | POA: Diagnosis not present

## 2016-01-19 DIAGNOSIS — E440 Moderate protein-calorie malnutrition: Secondary | ICD-10-CM | POA: Diagnosis not present

## 2016-01-19 DIAGNOSIS — I10 Essential (primary) hypertension: Secondary | ICD-10-CM | POA: Diagnosis not present

## 2016-01-19 DIAGNOSIS — K219 Gastro-esophageal reflux disease without esophagitis: Secondary | ICD-10-CM | POA: Diagnosis not present

## 2016-02-01 DIAGNOSIS — M79675 Pain in left toe(s): Secondary | ICD-10-CM | POA: Diagnosis not present

## 2016-02-01 DIAGNOSIS — B351 Tinea unguium: Secondary | ICD-10-CM | POA: Diagnosis not present

## 2016-02-01 DIAGNOSIS — M79674 Pain in right toe(s): Secondary | ICD-10-CM | POA: Diagnosis not present

## 2016-02-09 ENCOUNTER — Ambulatory Visit: Payer: Medicare Other | Admitting: Neurology

## 2016-02-10 ENCOUNTER — Ambulatory Visit (INDEPENDENT_AMBULATORY_CARE_PROVIDER_SITE_OTHER): Payer: Medicare Other | Admitting: Neurology

## 2016-02-10 ENCOUNTER — Encounter: Payer: Self-pay | Admitting: Neurology

## 2016-02-10 VITALS — BP 144/88 | HR 85 | Resp 14 | Wt 131.0 lb

## 2016-02-10 DIAGNOSIS — G40009 Localization-related (focal) (partial) idiopathic epilepsy and epileptic syndromes with seizures of localized onset, not intractable, without status epilepticus: Secondary | ICD-10-CM | POA: Diagnosis not present

## 2016-02-10 MED ORDER — LEVETIRACETAM 750 MG PO TABS: 750.0000 mg | ORAL_TABLET | Freq: Two times a day (BID) | ORAL | Status: DC

## 2016-02-10 NOTE — Progress Notes (Signed)
NEUROLOGY FOLLOW UP OFFICE NOTE  ROBER Stein [id number]  HISTORY OF PRESENT ILLNESS: I had the pleasure of seeing Wyatt Stein in follow-up in the neurology clinic on 02/10/2016. He is alone in the office today. 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  He is questioning the diagnosis of seizure, reporting that he was drinking heavily prior to October 2016 but has not had any alcohol since then. I reminded him of his wife's report of shaking last 10/20/15. Keppra dose was increased by his PCP to '750mg'$  BID. No side effects on current dose. He denies being told by his wife of any further episodes of shaking or loss of consciousness. He denies any headaches, dizziness, diplopia, dysarthria, dysphagia, neck/back pain, bowel/bladder dysfunction. No olfactory/gustatory hallucinations, deja vu, myoclonic jerks. He denies any further episodes of feeling upset in the stomach. He denies any falls and is "weaning off" his cane.  HPI: This is an 82 yo RH man with a history of hypertension, prostate cancer, alcohol abuse, sober since October 2016, who presented for evaluation of seizures. 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 His wife has witnessed the episodes and provides details on the history. Records from neurologist Dr. Janann Stein who he had seen in 2014 were reviewed as well. It appears he started having recurrent episodes of loss of awareness/consciousness in 2012 or so. His wife describes him to look like he is getting ready to sleep with head slumping forward, followed by violent shaking with stiffness for a few minutes. In the past, she has described his left arm to be extended and stiff. In the past, she reported he would be confused and out of it for 5-10 minutes after, but today reports that he is lethargic afterward but not confused. He looks like he wants to vomit after. He denies these  episodes occur, but does report that when his wife tells him he had a seizure, it is because his stomach becomes upset, "always starts from my stomach," then after he needs to have a bowel movement. Per wife, he has had urinary and bowel incontinence with these episodes. They mostly occur when sitting, usually early in the morning, but his wife feels that the fall last October leading to his left hip fracture was due to a seizure because she found him with foam around his mouth. 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 He has had 2 routine EEGs, normal wake EEG in 08/2013 and normal awake and drowsy EEG in 08/2015. He was evaluated by Neurology at that time and discharged home on Keppra '500mg'$  BID.   His wife reports that she had seen similar episodes when he was consuming large amounts of alcohol, but has witnessed 4 more similar spells since October 2016 and they both report he has been sober since then. In the past, she has witnessed him have similar shaking episodes but he would be more alert and talking, but that he had some alcohol at that time. She reports that prior to October 2016, she had seen around 3-4 episodes over a period of 6-7 months. Since the last shaking episode on 10/20/15, Keppra dose was increased by his PCP to '750mg'$  BID, no episodes since then. He is tolerating medication without any side effects.  His wife reports that after his hospitalization/SNF stay, his memory is much better, however he is less talkative, hence she does not know how he is feeling. He feels she is being overprotective. She is worried he will start drinking again.    He had a normal birth and early development. There is no history of febrile convulsions, CNS infections such as meningitis/encephalitis, significant traumatic brain injury, neurosurgical  procedures, or family history of seizures. He has 2 uncles who had tremors.   PAST MEDICAL HISTORY: Past Medical History  Diagnosis Date  . GERD (gastroesophageal reflux disease)   . Hypertension   . Cancer [name]) 2008    prostate, sees dr Wyatt Stein once a year  . History of blood transfusion 2006  . PONV (postoperative nausea and vomiting) 40 yrs ago    aspiration pneumonia after tooth extraxtion  . COPD (chronic obstructive pulmonary disease) (HCC)     MEDICATIONS: Current Outpatient Prescriptions on File Prior to Visit  Medication Sig Dispense Refill  . cholecalciferol (VITAMIN D) 1000 UNITS tablet Take 1,000 Units by mouth daily.    . CVS B-1 100 MG tablet Take 100 mg by mouth daily.  11  . ferrous sulfate 325 (65 FE) MG tablet Take 325 mg by mouth daily with breakfast.    . latanoprost (XALATAN) 0.005 % ophthalmic solution INSTILL 1 DROP(S) DROP INTO EACH EYE ONCE A DAY IN THE PM IN [facility] EYE  12  . levETIRAcetam (KEPPRA) 750 MG tablet Take 750 mg by mouth 2 (two) times daily.  11  . loratadine (CLARITIN) 10 MG tablet Take 10 mg by mouth daily.    . Multiple Vitamins-Minerals (CENTRUM SILVER ADULT 50+) TABS Take 1 tablet by mouth daily.    Marland Kitchen omeprazole (PRILOSEC OTC) 20 MG tablet Take 20 mg by mouth daily.      No current facility-administered medications on file prior to visit.    ALLERGIES: Allergies  Allergen Reactions  . Ace Inhibitors Anaphylaxis and Swelling  . Ramipril Anaphylaxis and Itching    FAMILY HISTORY: Family History  Problem Relation Age of Onset  . Colon cancer Father   . Heart Problems Mother     SOCIAL HISTORY: Social History   Social History  . Marital Status: Married    Spouse Name: Wyatt Stein   . Number of Children: 2  . Years of Education: College    Occupational History  . Retired     Social History Main Topics  . Smoking status: Current Every Day Smoker -- 0.50 packs/day for 30 years    Types: Cigarettes  . Smokeless tobacco: Never Used    . Alcohol Use: No     Comment: Former--pt states none since Oct  . Drug Use: No  . Sexual Activity: Yes    Birth Control/ Protection: None   Other Topics Concern  . Not on file   Social History Narrative   Patient lives at home with wife.   Patient is retired.    Patient is a college grad.    Patient has 2 children.     REVIEW OF SYSTEMS: Constitutional: No fevers, chills, or sweats, no generalized fatigue, change in appetite Eyes: No visual changes, double vision, eye pain Ear, nose and throat: No hearing loss, ear pain, nasal congestion, sore throat Cardiovascular: No chest pain, palpitations Respiratory:  No shortness of breath at rest or with exertion, wheezes GastrointestinaI: No nausea, vomiting, diarrhea, abdominal pain, fecal incontinence Genitourinary:  No dysuria, urinary retention or frequency Musculoskeletal:  No neck pain, back pain Integumentary: No rash, pruritus, skin lesions Neurological: as above Psychiatric: No depression, insomnia, anxiety Endocrine: No palpitations, fatigue, diaphoresis, mood swings, change in appetite, change in weight, increased thirst Hematologic/Lymphatic:  No anemia, purpura, petechiae. Allergic/Immunologic: no itchy/runny eyes, nasal congestion, recent allergic reactions, rashes  PHYSICAL EXAM: Filed Vitals:   02/10/16 0928  BP: 144/88  Pulse: 85  Resp: 14   General: No acute distress Head:  Normocephalic/atraumatic Neck: supple, no paraspinal tenderness, full range of motion Heart:  Regular rate and rhythm Lungs:  Clear to auscultation bilaterally Back: No paraspinal tenderness Skin/Extremities: No rash, no edema Neurological Exam: alert and oriented to person, place, and time. No aphasia or dysarthria. Fund of knowledge is appropriate.  Recent and remote memory are intact. 3/3 delayed recall.  Attention and concentration are normal.    Able to name objects and repeat phrases. Cranial nerves: Pupils equal, round, reactive to  light.  Extraocular movements intact with no nystagmus. Visual fields full. Facial sensation intact. No facial asymmetry. Tongue, uvula, palate midline.  Motor: Bulk and tone normal, muscle strength 5/5 throughout with no pronator drift.  Sensation to light touch intact.  No extinction to double simultaneous stimulation.  Deep tendon reflexes 2+ throughout, toes downgoing.  Finger to nose testing intact.  Gait narrow-based and steady, able to tandem walk adequately.  Romberg negative.  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 His wife reported episodes of shaking with left arm extension. MRI brain showed moderate atrophy, including in bilateral hippocampi, EEG x 2 normal. He is taking Keppra '750mg'$  BID and denies any further seizures or seizure-like symptoms, however he has been amnestic of these and thinks he does not have seizures. We again discussed how patients can be amnestic of their seizures, he reports his wife has not told him of any further similar episodes. Continue Keppra '750mg'$  BID. If symptoms continue, he will be scheduled for a 48-hour EEG to further classify his symptoms. He denies any alcohol intake since October 2016. We again discussed [place] driving laws to stop driving after a seizure, until 6 months seizure-free. He will follow-up in 6 months and knows to call for any changes.   Thank you for allowing me to participate in his care.  Please do not hesitate to call for any questions or concerns.  The duration of this appointment visit was 15 minutes of face-to-face time with the patient.  Greater than 50% of this time was spent in counseling, explanation of diagnosis, planning of further management, and coordination of care.   Ellouise Newer, M.D.   CC: Dr. Lysle Rubens

## 2016-02-10 NOTE — Patient Instructions (Signed)
1. Continue Keppra '750mg'$  twice a day 2. As per [place] driving laws, after a seizure, no driving until 6 months seizure-free 3. Follow-up in 6 months, call for any changes  Seizure Precautions: 1. If medication has been prescribed for you to prevent seizures, take it exactly as directed.  Do not stop taking the medicine without talking to your doctor first, even if you have not had a seizure in a long time.   2. Avoid activities in which a seizure would cause danger to yourself or to others.  Don't operate dangerous machinery, swim alone, or climb in high or dangerous places, such as on ladders, roofs, or girders.  Do not drive unless your doctor says you may.  3. If you have any warning that you may have a seizure, lay down in a safe place where you can't hurt yourself.    4.  No driving for 6 months from last seizure, as per [facility].   Please refer to the following link on the [place] website for more information: http://www.epilepsyfoundation.org/answerplace/[email or web address]/driving/drivingu.cfm   5.  Maintain good sleep hygiene. Avoid alcohol  6.  Contact your doctor if you have any problems that may be related to the medicine you are taking.  7.  Call 911 and bring the patient back to the ED if:        A.  The seizure lasts longer than 5 minutes.       B.  The patient doesn't awaken shortly after the seizure  C.  The patient has new problems such as difficulty seeing, speaking or moving  D.  The patient was injured during the seizure  E.  The patient has a temperature over 102 F (39C)  F.  The patient vomited and now is having trouble breathing

## 2016-02-22 DIAGNOSIS — H401232 Low-tension glaucoma, bilateral, moderate stage: Secondary | ICD-10-CM | POA: Diagnosis not present

## 2016-04-18 DIAGNOSIS — M25559 Pain in unspecified hip: Secondary | ICD-10-CM | POA: Diagnosis not present

## 2016-04-18 DIAGNOSIS — D649 Anemia, unspecified: Secondary | ICD-10-CM | POA: Diagnosis not present

## 2016-04-18 DIAGNOSIS — C61 Malignant neoplasm of prostate: Secondary | ICD-10-CM | POA: Diagnosis not present

## 2016-04-18 DIAGNOSIS — G621 Alcoholic polyneuropathy: Secondary | ICD-10-CM | POA: Diagnosis not present

## 2016-04-18 DIAGNOSIS — Z1389 Encounter for screening for other disorder: Secondary | ICD-10-CM | POA: Diagnosis not present

## 2016-04-18 DIAGNOSIS — E440 Moderate protein-calorie malnutrition: Secondary | ICD-10-CM | POA: Diagnosis not present

## 2016-04-18 DIAGNOSIS — K219 Gastro-esophageal reflux disease without esophagitis: Secondary | ICD-10-CM | POA: Diagnosis not present

## 2016-04-18 DIAGNOSIS — R569 Unspecified convulsions: Secondary | ICD-10-CM | POA: Diagnosis not present

## 2016-04-18 DIAGNOSIS — J449 Chronic obstructive pulmonary disease, unspecified: Secondary | ICD-10-CM | POA: Diagnosis not present

## 2016-04-18 DIAGNOSIS — I10 Essential (primary) hypertension: Secondary | ICD-10-CM | POA: Diagnosis not present

## 2016-05-08 DIAGNOSIS — M7062 Trochanteric bursitis, left hip: Secondary | ICD-10-CM | POA: Diagnosis not present

## 2016-07-11 DIAGNOSIS — M216X9 Other acquired deformities of unspecified foot: Secondary | ICD-10-CM | POA: Diagnosis not present

## 2016-07-25 ENCOUNTER — Other Ambulatory Visit: Payer: Self-pay | Admitting: Internal Medicine

## 2016-07-25 ENCOUNTER — Ambulatory Visit
Admission: RE | Admit: 2016-07-25 | Discharge: 2016-07-25 | Disposition: A | Discharge status: Home / Self Care | Payer: Medicare Other | Source: Ambulatory Visit | Attending: Internal Medicine | Admitting: Internal Medicine

## 2016-07-25 DIAGNOSIS — F17210 Nicotine dependence, cigarettes, uncomplicated: Secondary | ICD-10-CM | POA: Diagnosis not present

## 2016-07-25 DIAGNOSIS — C61 Malignant neoplasm of prostate: Secondary | ICD-10-CM | POA: Diagnosis not present

## 2016-07-25 DIAGNOSIS — Z23 Encounter for immunization: Secondary | ICD-10-CM | POA: Diagnosis not present

## 2016-07-25 DIAGNOSIS — E440 Moderate protein-calorie malnutrition: Secondary | ICD-10-CM | POA: Diagnosis not present

## 2016-07-25 DIAGNOSIS — I10 Essential (primary) hypertension: Secondary | ICD-10-CM | POA: Diagnosis not present

## 2016-07-25 DIAGNOSIS — J449 Chronic obstructive pulmonary disease, unspecified: Secondary | ICD-10-CM

## 2016-07-25 DIAGNOSIS — D649 Anemia, unspecified: Secondary | ICD-10-CM | POA: Diagnosis not present

## 2016-07-25 DIAGNOSIS — R569 Unspecified convulsions: Secondary | ICD-10-CM | POA: Diagnosis not present

## 2016-08-08 ENCOUNTER — Encounter: Payer: Self-pay | Admitting: Neurology

## 2016-08-08 ENCOUNTER — Ambulatory Visit (INDEPENDENT_AMBULATORY_CARE_PROVIDER_SITE_OTHER): Payer: Medicare Other | Admitting: Neurology

## 2016-08-08 VITALS — BP 140/76 | HR 77 | Temp 98.1°F | Ht 69.0 in | Wt 137.1 lb

## 2016-08-08 DIAGNOSIS — G40009 Localization-related (focal) (partial) idiopathic epilepsy and epileptic syndromes with seizures of localized onset, not intractable, without status epilepticus: Secondary | ICD-10-CM | POA: Diagnosis not present

## 2016-08-08 MED ORDER — LEVETIRACETAM 750 MG PO TABS: 750.0000 mg | ORAL_TABLET | Freq: Two times a day (BID) | ORAL | 3 refills | Status: DC

## 2016-08-08 NOTE — Progress Notes (Signed)
NEUROLOGY FOLLOW UP OFFICE NOTE  Wyatt Stein [id number]  HISTORY OF PRESENT ILLNESS: I had the pleasure of seeing Wyatt Stein in follow-up in the neurology clinic on 08/08/2016. He is again alone in the office today. 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 He denies any seizures since December 2016 when his wife reported an episode of shaking. He is taking Keppra '750mg'$  BID without side effects. He again reports he feels that seizure was due to alcohol use prior to October 2016, he has been sober since then. He denies being told by his wife of any further episodes of shaking or loss of consciousness. He denies any headaches, dizziness, diplopia, dysarthria, dysphagia, neck/back pain, bowel/bladder dysfunction. No olfactory/gustatory hallucinations, deja vu, myoclonic jerks. He denies any further episodes of feeling upset in the stomach. He denies any falls.  HPI: This is an 82 yo RH man with a history of hypertension, prostate cancer, alcohol abuse, sober since October 2016, who presented for evaluation of seizures. 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 His wife has witnessed the episodes and provides details on the history. Records from neurologist Dr. [name] who he had seen in 2014 were reviewed as well. It appears he started having recurrent episodes of loss of awareness/consciousness in 2012 or so. His wife describes him to look like he is getting ready to sleep with head slumping forward, followed by violent shaking with stiffness for a few minutes. In the past, she has described his left arm to be extended and stiff. In the past, she reported he would be confused and out of it for 5-10 minutes after, but today reports that he is lethargic afterward but not confused. He looks like he wants to vomit after. He denies these episodes occur, but does report that when his wife tells him he  had a seizure, it is because his stomach becomes upset, "always starts from my stomach," then after he needs to have a bowel movement. Per wife, he has had urinary and bowel incontinence with these episodes. They mostly occur when sitting, usually early in the morning, but his wife feels that the fall last October leading to his left hip fracture was due to a seizure because she found him with foam around his mouth. 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 He has had 2 routine EEGs, normal wake EEG in 08/2013 and normal awake and drowsy EEG in 08/2015. He was evaluated by Neurology at that time and discharged home on Keppra '500mg'$  BID.   His wife reports that she had seen similar episodes when he was consuming large amounts of alcohol, but has witnessed 4 more similar spells since October 2016 and they both report he has been sober since then. In the past, she has witnessed him have similar shaking episodes but he would be more alert and talking, but that he had some alcohol at that time. She reports that prior to October 2016, she had seen around 3-4 episodes over a period of 6-7 months. Since the last shaking episode on 10/20/15, Keppra dose was increased by his PCP to '750mg'$  BID, no episodes since then. He is tolerating medication without any side effects. His wife reports that after his hospitalization/SNF stay, his memory is much  better, however he is less talkative, hence she does not know how he is feeling. He feels she is being overprotective. She is worried he will start drinking again.    He had a normal birth and early development. There is no history of febrile convulsions, CNS infections such as meningitis/encephalitis, significant traumatic brain injury, neurosurgical procedures, or family history of seizures. He has 2 uncles who  had tremors.   PAST MEDICAL HISTORY: Past Medical History:  Diagnosis Date  . Cancer [facility]) 2008   prostate, sees dr [name] once a year  . COPD (chronic obstructive pulmonary disease) ([place])   . GERD (gastroesophageal reflux disease)   . History of blood transfusion 2006  . Hypertension   . PONV (postoperative nausea and vomiting) 40 yrs ago   aspiration pneumonia after tooth extraxtion    MEDICATIONS: Current Outpatient Prescriptions on File Prior to Visit  Medication Sig Dispense Refill  . cholecalciferol (VITAMIN D) 1000 UNITS tablet Take 1,000 Units by mouth daily.    . CVS B-1 100 MG tablet Take 100 mg by mouth daily.  11  . ferrous sulfate 325 (65 FE) MG tablet Take 325 mg by mouth daily with breakfast.    . latanoprost (XALATAN) 0.005 % ophthalmic solution INSTILL 1 DROP(S) DROP INTO EACH EYE ONCE A DAY IN THE PM IN [facility] EYE  12  . levETIRAcetam (KEPPRA) 750 MG tablet Take 1 tablet (750 mg total) by mouth 2 (two) times daily. 180 tablet 3  . Multiple Vitamins-Minerals (CENTRUM SILVER ADULT 50+) TABS Take 1 tablet by mouth daily.    . vitamin B-12 (CYANOCOBALAMIN) 1000 MCG tablet Take 1,000 mcg by mouth daily.    . Calcium Carb-Cholecalciferol (CALCIUM 500 +D) 500-400 MG-UNIT TABS Take by mouth 2 (two) times daily.    Marland Kitchen loratadine (CLARITIN) 10 MG tablet Take 10 mg by mouth daily.    Marland Kitchen omeprazole (PRILOSEC OTC) 20 MG tablet Take 20 mg by mouth daily.     Marland Kitchen terbinafine (LAMISIL) 250 MG tablet Take 250 mg by mouth daily.  0   No current facility-administered medications on file prior to visit.     ALLERGIES: Allergies  Allergen Reactions  . Ace Inhibitors Anaphylaxis and Swelling  . Ramipril Anaphylaxis and Itching    FAMILY HISTORY: Family History  Problem Relation Age of Onset  . Colon cancer Father   . Heart Problems Mother     SOCIAL HISTORY: Social History   Social History  . Marital status: Married    Spouse name: [name]   . Number of children: 2  . Years  of education: College    Occupational History  . Retired     Social History Main Topics  . Smoking status: Current Every Day Smoker    Packs/day: 0.50    Years: 30.00    Types: Cigarettes  . Smokeless tobacco: Never Used  . Alcohol use No     Comment: Former--pt states none since Oct  . Drug use: No  . Sexual activity: Yes    Birth control/ protection: None   Other Topics Concern  . Not on file   Social History Narrative   Patient lives at home with wife.   Patient is retired.    Patient is a college grad.    Patient has 2 children.     REVIEW OF SYSTEMS: Constitutional: No fevers, chills, or sweats, no generalized fatigue, change in appetite Eyes: No visual changes, double vision, eye pain Ear, nose and throat: No  hearing loss, ear pain, nasal congestion, sore throat Cardiovascular: No chest pain, palpitations Respiratory:  No shortness of breath at rest or with exertion, wheezes GastrointestinaI: No nausea, vomiting, diarrhea, abdominal pain, fecal incontinence Genitourinary:  No dysuria, urinary retention or frequency Musculoskeletal:  No neck pain, back pain Integumentary: No rash, pruritus, skin lesions Neurological: as above Psychiatric: No depression, insomnia, anxiety Endocrine: No palpitations, fatigue, diaphoresis, mood swings, change in appetite, change in weight, increased thirst Hematologic/Lymphatic:  No anemia, purpura, petechiae. Allergic/Immunologic: no itchy/runny eyes, nasal congestion, recent allergic reactions, rashes  PHYSICAL EXAM: Vitals:   08/08/16 1422  BP: 140/76  Pulse: 77  Temp: 98.1 F (36.7 C)   General: No acute distress Head:  Normocephalic/atraumatic Neck: supple, no paraspinal tenderness, full range of motion Heart:  Regular rate and rhythm Lungs:  Clear to auscultation bilaterally Back: No paraspinal tenderness Skin/Extremities: No rash, no edema Neurological Exam: alert and oriented to person, place, and time. No aphasia  or dysarthria. Fund of knowledge is appropriate.  Recent and remote memory are intact. 3/3 delayed recall.  Attention and concentration are normal.    Able to name objects and repeat phrases. Cranial nerves: Pupils equal, round, reactive to light.  Extraocular movements intact with no nystagmus. Visual fields full. Facial sensation intact. No facial asymmetry. Tongue, uvula, palate midline.  Motor: Bulk and tone normal, muscle strength 5/5 throughout with no pronator drift.  Sensation to light touch intact.  No extinction to double simultaneous stimulation.  Deep tendon reflexes 2+ throughout, toes downgoing.  Finger to nose testing intact.  Gait narrow-based and steady, mild difficulty with tandem walk but able. Romberg negative.  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 His wife reported episodes of shaking with left arm extension. MRI brain showed moderate atrophy, including in bilateral hippocampi, EEG x 2 normal. He is taking Keppra '750mg'$  BID and denies any further seizures or seizure-like symptoms. He is alone today and denies any seizures, however he has been amnestic of these and thinks he does not have seizures. He reports his wife has not told him of any further similar episodes. Continue Keppra '750mg'$  BID. We again discussed [place] driving laws to stop driving after a seizure, until 6 months seizure-free. He will follow-up in 1 year and knows to call for any changes.   Thank you for allowing me to participate in his care.  Please do not hesitate to call for any questions or concerns.  The duration of this appointment visit was 15 minutes of face-to-face time with the patient.  Greater than 50% of this time was spent in counseling, explanation of diagnosis, planning of further management, and coordination of care.   Ellouise Newer, M.D.   CC:  Dr. Lysle Rubens

## 2016-08-08 NOTE — Patient Instructions (Signed)
1. Continue Levetiracetam '750mg'$ : Take 1 tablet twice a day 2. Follow-up in 1 year, call for any changes  Seizure Precautions: 1. If medication has been prescribed for you to prevent seizures, take it exactly as directed.  Do not stop taking the medicine without talking to your doctor first, even if you have not had a seizure in a long time.   2. Avoid activities in which a seizure would cause danger to yourself or to others.  Don't operate dangerous machinery, swim alone, or climb in high or dangerous places, such as on ladders, roofs, or girders.  Do not drive unless your doctor says you may.  3. If you have any warning that you may have a seizure, lay down in a safe place where you can't hurt yourself.    4.  No driving for 6 months from last seizure, as per [facility].   Please refer to the following link on the [place] website for more information: http://[email or web address].org/answerplace/Social/driving/drivingu.cfm   5.  Maintain good sleep hygiene. Avoid alcohol  6.  Contact your doctor if you have any problems that may be related to the medicine you are taking.  7.  Call 911 and bring the patient back to the ED if:        A.  The seizure lasts longer than 5 minutes.       B.  The patient doesn't awaken shortly after the seizure  C.  The patient has new problems such as difficulty seeing, speaking or moving  D.  The patient was injured during the seizure  E.  The patient has a temperature over 102 F (39C)  F.  The patient vomited and now is having trouble breathing

## 2016-08-29 DIAGNOSIS — H401232 Low-tension glaucoma, bilateral, moderate stage: Secondary | ICD-10-CM | POA: Diagnosis not present

## 2016-10-10 DIAGNOSIS — M79609 Pain in unspecified limb: Secondary | ICD-10-CM | POA: Diagnosis not present

## 2016-10-10 DIAGNOSIS — B351 Tinea unguium: Secondary | ICD-10-CM | POA: Diagnosis not present

## 2016-11-17 DIAGNOSIS — Z8546 Personal history of malignant neoplasm of prostate: Secondary | ICD-10-CM | POA: Diagnosis not present

## 2016-11-27 DIAGNOSIS — Z8546 Personal history of malignant neoplasm of prostate: Secondary | ICD-10-CM | POA: Diagnosis not present

## 2016-12-11 DIAGNOSIS — M546 Pain in thoracic spine: Secondary | ICD-10-CM | POA: Diagnosis not present

## 2016-12-13 DIAGNOSIS — M4324 Fusion of spine, thoracic region: Secondary | ICD-10-CM | POA: Diagnosis not present

## 2016-12-13 DIAGNOSIS — M545 Low back pain: Secondary | ICD-10-CM | POA: Diagnosis not present

## 2016-12-13 DIAGNOSIS — M4326 Fusion of spine, lumbar region: Secondary | ICD-10-CM | POA: Diagnosis not present

## 2016-12-13 DIAGNOSIS — S72142S Displaced intertrochanteric fracture of left femur, sequela: Secondary | ICD-10-CM | POA: Diagnosis not present

## 2016-12-18 DIAGNOSIS — M4326 Fusion of spine, lumbar region: Secondary | ICD-10-CM | POA: Diagnosis not present

## 2016-12-18 DIAGNOSIS — M4324 Fusion of spine, thoracic region: Secondary | ICD-10-CM | POA: Diagnosis not present

## 2016-12-18 DIAGNOSIS — M545 Low back pain: Secondary | ICD-10-CM | POA: Diagnosis not present

## 2016-12-18 DIAGNOSIS — S72142S Displaced intertrochanteric fracture of left femur, sequela: Secondary | ICD-10-CM | POA: Diagnosis not present

## 2016-12-20 DIAGNOSIS — S72142S Displaced intertrochanteric fracture of left femur, sequela: Secondary | ICD-10-CM | POA: Diagnosis not present

## 2016-12-20 DIAGNOSIS — M4326 Fusion of spine, lumbar region: Secondary | ICD-10-CM | POA: Diagnosis not present

## 2016-12-20 DIAGNOSIS — M4324 Fusion of spine, thoracic region: Secondary | ICD-10-CM | POA: Diagnosis not present

## 2016-12-20 DIAGNOSIS — M545 Low back pain: Secondary | ICD-10-CM | POA: Diagnosis not present

## 2016-12-26 DIAGNOSIS — M4324 Fusion of spine, thoracic region: Secondary | ICD-10-CM | POA: Diagnosis not present

## 2016-12-26 DIAGNOSIS — S72142S Displaced intertrochanteric fracture of left femur, sequela: Secondary | ICD-10-CM | POA: Diagnosis not present

## 2016-12-26 DIAGNOSIS — M545 Low back pain: Secondary | ICD-10-CM | POA: Diagnosis not present

## 2016-12-26 DIAGNOSIS — M4326 Fusion of spine, lumbar region: Secondary | ICD-10-CM | POA: Diagnosis not present

## 2016-12-28 DIAGNOSIS — S72142S Displaced intertrochanteric fracture of left femur, sequela: Secondary | ICD-10-CM | POA: Diagnosis not present

## 2016-12-28 DIAGNOSIS — M545 Low back pain: Secondary | ICD-10-CM | POA: Diagnosis not present

## 2016-12-28 DIAGNOSIS — M4324 Fusion of spine, thoracic region: Secondary | ICD-10-CM | POA: Diagnosis not present

## 2016-12-28 DIAGNOSIS — M4326 Fusion of spine, lumbar region: Secondary | ICD-10-CM | POA: Diagnosis not present

## 2017-01-02 DIAGNOSIS — M4324 Fusion of spine, thoracic region: Secondary | ICD-10-CM | POA: Diagnosis not present

## 2017-01-02 DIAGNOSIS — M4326 Fusion of spine, lumbar region: Secondary | ICD-10-CM | POA: Diagnosis not present

## 2017-01-02 DIAGNOSIS — M545 Low back pain: Secondary | ICD-10-CM | POA: Diagnosis not present

## 2017-01-02 DIAGNOSIS — S72142S Displaced intertrochanteric fracture of left femur, sequela: Secondary | ICD-10-CM | POA: Diagnosis not present

## 2017-01-03 DIAGNOSIS — M545 Low back pain: Secondary | ICD-10-CM | POA: Diagnosis not present

## 2017-01-03 DIAGNOSIS — M4324 Fusion of spine, thoracic region: Secondary | ICD-10-CM | POA: Diagnosis not present

## 2017-01-03 DIAGNOSIS — M4326 Fusion of spine, lumbar region: Secondary | ICD-10-CM | POA: Diagnosis not present

## 2017-01-03 DIAGNOSIS — S72142S Displaced intertrochanteric fracture of left femur, sequela: Secondary | ICD-10-CM | POA: Diagnosis not present

## 2017-01-09 DIAGNOSIS — M79609 Pain in unspecified limb: Secondary | ICD-10-CM | POA: Diagnosis not present

## 2017-01-09 DIAGNOSIS — B351 Tinea unguium: Secondary | ICD-10-CM | POA: Diagnosis not present

## 2017-01-09 DIAGNOSIS — M4326 Fusion of spine, lumbar region: Secondary | ICD-10-CM | POA: Diagnosis not present

## 2017-01-09 DIAGNOSIS — M4324 Fusion of spine, thoracic region: Secondary | ICD-10-CM | POA: Diagnosis not present

## 2017-01-09 DIAGNOSIS — M545 Low back pain: Secondary | ICD-10-CM | POA: Diagnosis not present

## 2017-01-09 DIAGNOSIS — S72142S Displaced intertrochanteric fracture of left femur, sequela: Secondary | ICD-10-CM | POA: Diagnosis not present

## 2017-01-12 DIAGNOSIS — M4324 Fusion of spine, thoracic region: Secondary | ICD-10-CM | POA: Diagnosis not present

## 2017-01-12 DIAGNOSIS — S72142S Displaced intertrochanteric fracture of left femur, sequela: Secondary | ICD-10-CM | POA: Diagnosis not present

## 2017-01-12 DIAGNOSIS — M545 Low back pain: Secondary | ICD-10-CM | POA: Diagnosis not present

## 2017-01-12 DIAGNOSIS — M4326 Fusion of spine, lumbar region: Secondary | ICD-10-CM | POA: Diagnosis not present

## 2017-01-22 DIAGNOSIS — M4324 Fusion of spine, thoracic region: Secondary | ICD-10-CM | POA: Diagnosis not present

## 2017-01-30 DIAGNOSIS — Z0000 Encounter for general adult medical examination without abnormal findings: Secondary | ICD-10-CM | POA: Diagnosis not present

## 2017-01-30 DIAGNOSIS — F322 Major depressive disorder, single episode, severe without psychotic features: Secondary | ICD-10-CM | POA: Diagnosis not present

## 2017-01-30 DIAGNOSIS — Z1389 Encounter for screening for other disorder: Secondary | ICD-10-CM | POA: Diagnosis not present

## 2017-01-30 DIAGNOSIS — D649 Anemia, unspecified: Secondary | ICD-10-CM | POA: Diagnosis not present

## 2017-01-30 DIAGNOSIS — D709 Neutropenia, unspecified: Secondary | ICD-10-CM | POA: Diagnosis not present

## 2017-01-30 DIAGNOSIS — G621 Alcoholic polyneuropathy: Secondary | ICD-10-CM | POA: Diagnosis not present

## 2017-01-30 DIAGNOSIS — J449 Chronic obstructive pulmonary disease, unspecified: Secondary | ICD-10-CM | POA: Diagnosis not present

## 2017-01-30 DIAGNOSIS — I10 Essential (primary) hypertension: Secondary | ICD-10-CM | POA: Diagnosis not present

## 2017-01-30 DIAGNOSIS — E440 Moderate protein-calorie malnutrition: Secondary | ICD-10-CM | POA: Diagnosis not present

## 2017-01-30 DIAGNOSIS — C61 Malignant neoplasm of prostate: Secondary | ICD-10-CM | POA: Diagnosis not present

## 2017-01-30 DIAGNOSIS — F17210 Nicotine dependence, cigarettes, uncomplicated: Secondary | ICD-10-CM | POA: Diagnosis not present

## 2017-01-30 DIAGNOSIS — R569 Unspecified convulsions: Secondary | ICD-10-CM | POA: Diagnosis not present

## 2017-01-30 DIAGNOSIS — M519 Unspecified thoracic, thoracolumbar and lumbosacral intervertebral disc disorder: Secondary | ICD-10-CM | POA: Diagnosis not present

## 2017-01-30 DIAGNOSIS — R7309 Other abnormal glucose: Secondary | ICD-10-CM | POA: Diagnosis not present

## 2017-01-30 DIAGNOSIS — E7800 Pure hypercholesterolemia, unspecified: Secondary | ICD-10-CM | POA: Diagnosis not present

## 2017-02-22 DIAGNOSIS — R7989 Other specified abnormal findings of blood chemistry: Secondary | ICD-10-CM | POA: Diagnosis not present

## 2017-02-27 DIAGNOSIS — H04123 Dry eye syndrome of bilateral lacrimal glands: Secondary | ICD-10-CM | POA: Diagnosis not present

## 2017-02-27 DIAGNOSIS — H401232 Low-tension glaucoma, bilateral, moderate stage: Secondary | ICD-10-CM | POA: Diagnosis not present

## 2017-02-27 DIAGNOSIS — H2513 Age-related nuclear cataract, bilateral: Secondary | ICD-10-CM | POA: Diagnosis not present

## 2017-02-27 DIAGNOSIS — H4322 Crystalline deposits in vitreous body, left eye: Secondary | ICD-10-CM | POA: Diagnosis not present

## 2017-03-27 DIAGNOSIS — Z720 Tobacco use: Secondary | ICD-10-CM | POA: Diagnosis not present

## 2017-03-27 DIAGNOSIS — N183 Chronic kidney disease, stage 3 (moderate): Secondary | ICD-10-CM | POA: Diagnosis not present

## 2017-03-29 DIAGNOSIS — N183 Chronic kidney disease, stage 3 (moderate): Secondary | ICD-10-CM | POA: Diagnosis not present

## 2017-04-03 DIAGNOSIS — M79609 Pain in unspecified limb: Secondary | ICD-10-CM | POA: Diagnosis not present

## 2017-04-03 DIAGNOSIS — M792 Neuralgia and neuritis, unspecified: Secondary | ICD-10-CM | POA: Diagnosis not present

## 2017-04-03 DIAGNOSIS — B351 Tinea unguium: Secondary | ICD-10-CM | POA: Diagnosis not present

## 2017-04-04 ENCOUNTER — Other Ambulatory Visit: Payer: Self-pay | Admitting: Nephrology

## 2017-04-04 DIAGNOSIS — N183 Chronic kidney disease, stage 3 unspecified: Secondary | ICD-10-CM

## 2017-04-10 ENCOUNTER — Ambulatory Visit
Admission: RE | Admit: 2017-04-10 | Discharge: 2017-04-10 | Disposition: A | Discharge status: Home / Self Care | Payer: Medicare Other | Source: Ambulatory Visit | Attending: Nephrology | Admitting: Nephrology

## 2017-04-10 DIAGNOSIS — N189 Chronic kidney disease, unspecified: Secondary | ICD-10-CM | POA: Diagnosis not present

## 2017-04-10 DIAGNOSIS — N183 Chronic kidney disease, stage 3 unspecified: Secondary | ICD-10-CM

## 2017-05-02 DIAGNOSIS — I129 Hypertensive chronic kidney disease with stage 1 through stage 4 chronic kidney disease, or unspecified chronic kidney disease: Secondary | ICD-10-CM | POA: Diagnosis not present

## 2017-05-02 DIAGNOSIS — Z720 Tobacco use: Secondary | ICD-10-CM | POA: Diagnosis not present

## 2017-05-02 DIAGNOSIS — N183 Chronic kidney disease, stage 3 (moderate): Secondary | ICD-10-CM | POA: Diagnosis not present

## 2017-06-26 DIAGNOSIS — M2170 Unequal limb length (acquired), unspecified site: Secondary | ICD-10-CM | POA: Diagnosis not present

## 2017-08-07 DIAGNOSIS — D649 Anemia, unspecified: Secondary | ICD-10-CM | POA: Diagnosis not present

## 2017-08-07 DIAGNOSIS — C61 Malignant neoplasm of prostate: Secondary | ICD-10-CM | POA: Diagnosis not present

## 2017-08-07 DIAGNOSIS — F17210 Nicotine dependence, cigarettes, uncomplicated: Secondary | ICD-10-CM | POA: Diagnosis not present

## 2017-08-07 DIAGNOSIS — R569 Unspecified convulsions: Secondary | ICD-10-CM | POA: Diagnosis not present

## 2017-08-07 DIAGNOSIS — G621 Alcoholic polyneuropathy: Secondary | ICD-10-CM | POA: Diagnosis not present

## 2017-08-07 DIAGNOSIS — Z23 Encounter for immunization: Secondary | ICD-10-CM | POA: Diagnosis not present

## 2017-08-07 DIAGNOSIS — I10 Essential (primary) hypertension: Secondary | ICD-10-CM | POA: Diagnosis not present

## 2017-08-07 DIAGNOSIS — J449 Chronic obstructive pulmonary disease, unspecified: Secondary | ICD-10-CM | POA: Diagnosis not present

## 2017-08-07 DIAGNOSIS — N183 Chronic kidney disease, stage 3 (moderate): Secondary | ICD-10-CM | POA: Diagnosis not present

## 2017-08-08 ENCOUNTER — Ambulatory Visit (INDEPENDENT_AMBULATORY_CARE_PROVIDER_SITE_OTHER): Payer: Medicare Other | Admitting: Neurology

## 2017-08-08 ENCOUNTER — Encounter: Payer: Self-pay | Admitting: Neurology

## 2017-08-08 VITALS — BP 126/74 | HR 113 | Ht 68.0 in | Wt 147.0 lb

## 2017-08-08 DIAGNOSIS — G40009 Localization-related (focal) (partial) idiopathic epilepsy and epileptic syndromes with seizures of localized onset, not intractable, without status epilepticus: Secondary | ICD-10-CM | POA: Diagnosis not present

## 2017-08-08 DIAGNOSIS — R413 Other amnesia: Secondary | ICD-10-CM | POA: Diagnosis not present

## 2017-08-08 NOTE — Patient Instructions (Signed)
1. Schedule 1-hour EEG 2. Our office will you with EEG results, if normal, start reducing Keppra to 1/2 tablet every night, then stop medication. If seizures recur, please call our office 3. Continue to monitor memory 4. Follow-up in 6 months, call for any changes  Seizure Precautions: 1. If medication has been prescribed for you to prevent seizures, take it exactly as directed.  Do not stop taking the medicine without talking to your doctor first, even if you have not had a seizure in a long time.   2. Avoid activities in which a seizure would cause danger to yourself or to others.  Don't operate dangerous machinery, swim alone, or climb in high or dangerous places, such as on ladders, roofs, or girders.  Do not drive unless your doctor says you may.  3. If you have any warning that you may have a seizure, lay down in a safe place where you can't hurt yourself.    4.  No driving for 6 months from last seizure, as per [facility].   Please refer to the following link on the [place] website for more information: http://www.epilepsyfoundation.org/answerplace/Social/driving/drivingu.cfm   5.  Maintain good sleep hygiene. Avoid alcohol  6.  Contact your doctor if you have any problems that may be related to the medicine you are taking.  7.  Call 911 and bring the patient back to the ED if:        A.  The seizure lasts longer than 5 minutes.       B.  The patient doesn't awaken shortly after the seizure  C.  The patient has new problems such as difficulty seeing, speaking or moving  D.  The patient was injured during the seizure  E.  The patient has a temperature over 102 F (39C)  F.  The patient vomited and now is having trouble breathing

## 2017-08-08 NOTE — Progress Notes (Signed)
NEUROLOGY FOLLOW UP OFFICE NOTE  Wyatt Stein [id number]  HISTORY OF PRESENT ILLNESS: I had the pleasure of seeing Wyatt Stein in follow-up in the neurology clinic on 08/08/2017. He is again alone in the office today. 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 He denies any seizures since December 2016 when his wife reported an episode of shaking (sober since October 2016). He self-reduced Keppra 750mg  to 1 tab qhs around 3-4 months ago with no report of seizures or seizure-like symptoms. He denies being told by his wife of any further episodes of shaking or loss of consciousness. He denies any headaches, dizziness, diplopia, dysarthria, dysphagia, neck/back pain, bowel/bladder dysfunction. No olfactory/gustatory hallucinations, deja vu, myoclonic jerks. He denies any further episodes of feeling upset in the stomach. He denies any falls. He has noticed some decline in mental functioning, more with name recall. He only drives short distances and denies getting lost. He denies missing medications. Most bills are on autopay, he denies any missed bills. He denies misplacing things frequently. No difficulties with ADLs.   HPI: This is an 83 yo RH man with a history of hypertension, prostate cancer, alcohol abuse, sober since October 2016, who presented for evaluation of seizures. 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 His wife has witnessed the episodes and provides details on the history. Records from neurologist Dr. [name] who he had seen in 2014 were reviewed as well. It appears he started having recurrent episodes of loss of awareness/consciousness in 2012 or so. His wife describes him to look like he is getting ready to sleep with head slumping forward, followed by violent shaking with stiffness for a few minutes. In the past, she has described his left arm to be extended and stiff. In the  past, she reported he would be confused and out of it for 5-10 minutes after, but today reports that he is lethargic afterward but not confused. He looks like he wants to vomit after. He denies these episodes occur, but does report that when his wife tells him he had a seizure, it is because his stomach becomes upset, "always starts from my stomach," then after he needs to have a bowel movement. Per wife, he has had urinary and bowel incontinence with these episodes. They mostly occur when sitting, usually early in the morning, but his wife feels that the fall last October leading to his left hip fracture was due to a seizure because she found him with foam around his mouth. 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 He has had 2 routine EEGs, normal wake EEG in 08/2013 and normal awake and drowsy EEG in 08/2015. He was evaluated by Neurology at that time and discharged home on Keppra 500mg  BID.   His wife reports that she had seen similar episodes when he was consuming large amounts of alcohol, but has witnessed 4 more similar spells since October 2016 and they both report he has been sober since then. In the past, she has witnessed him have similar shaking episodes but he would be more alert and talking, but that he had some alcohol at that time. She reports that prior to October 2016, she had seen around 3-4 episodes over a period of 6-7 months. Since the last  shaking episode on 10/20/15, Keppra dose was increased by his PCP to 750mg  BID, no episodes since then. He is tolerating medication without any side effects. His wife reports that after his hospitalization/SNF stay, his memory is much better, however he is less talkative, hence she does not know how he is feeling. He feels she is being overprotective. She is worried he will start drinking  again.    He had a normal birth and early development. There is no history of febrile convulsions, CNS infections such as meningitis/encephalitis, significant traumatic brain injury, neurosurgical procedures, or family history of seizures. He has 2 uncles who had tremors.   PAST MEDICAL HISTORY: Past Medical History:  Diagnosis Date  . Cancer [facility]) 2008   prostate, sees dr [name] once a year  . COPD (chronic obstructive pulmonary disease) ([place])   . GERD (gastroesophageal reflux disease)   . History of blood transfusion 2006  . Hypertension   . PONV (postoperative nausea and vomiting) 40 yrs ago   aspiration pneumonia after tooth extraxtion    MEDICATIONS: Current Outpatient Prescriptions on File Prior to Visit  Medication Sig Dispense Refill  . Calcium Carb-Cholecalciferol (CALCIUM 500 +D) 500-400 MG-UNIT TABS Take by mouth 2 (two) times daily.    . cholecalciferol (VITAMIN D) 1000 UNITS tablet Take 1,000 Units by mouth daily.    . [employer] B-1 100 MG tablet Take 100 mg by mouth daily.  11  . ferrous sulfate 325 (65 FE) MG tablet Take 325 mg by mouth daily with breakfast.    . latanoprost (XALATAN) 0.005 % ophthalmic solution INSTILL 1 DROP(S) DROP INTO EACH EYE ONCE A DAY IN THE PM IN [facility] EYE  12  . levETIRAcetam (KEPPRA) 750 MG tablet Take 1 tablet (750 mg total) by mouth 2 (two) times daily (Taking 1 tab qhs) 180 tablet 3  . loratadine (CLARITIN) 10 MG tablet Take 10 mg by mouth daily.    . Magnesium 250 MG TABS Take by mouth.    . Multiple Vitamins-Minerals (CENTRUM SILVER ADULT 50+) TABS Take 1 tablet by mouth daily.    Marland Kitchen omeprazole (PRILOSEC OTC) 20 MG tablet Take 20 mg by mouth daily.     Marland Kitchen terbinafine (LAMISIL) 250 MG tablet Take 250 mg by mouth daily.  0  . vitamin B-12 (CYANOCOBALAMIN) 1000 MCG tablet Take 1,000 mcg by mouth daily.     No current facility-administered medications on file prior to visit.     ALLERGIES: Allergies  Allergen Reactions  . Ace Inhibitors  Anaphylaxis and Swelling  . Ramipril Anaphylaxis and Itching    FAMILY HISTORY: Family History  Problem Relation Age of Onset  . Heart Problems Mother   . Colon cancer Father     SOCIAL HISTORY: Social History   Social History  . Marital status: Married    Spouse name: [name]   . Number of children: 2  . Years of education: College    Occupational History  . Retired     Social History Main Topics  . Smoking status: Current Every Day Smoker    Packs/day: 0.50    Years: 30.00    Types: Cigarettes  . Smokeless tobacco: Never Used  . Alcohol use No     Comment: Former--pt states none since Oct  . Drug use: No  . Sexual activity: Yes    Birth control/ protection: None   Other Topics Concern  . Not on file   Social History Narrative   Patient lives at home with  wife.   Patient is retired.    Patient is a college grad.    Patient has 2 children.     REVIEW OF SYSTEMS: Constitutional: No fevers, chills, or sweats, no generalized fatigue, change in appetite Eyes: No visual changes, double vision, eye pain Ear, nose and throat: No hearing loss, ear pain, nasal congestion, sore throat Cardiovascular: No chest pain, palpitations Respiratory:  No shortness of breath at rest or with exertion, wheezes GastrointestinaI: No nausea, vomiting, diarrhea, abdominal pain, fecal incontinence Genitourinary:  No dysuria, urinary retention or frequency Musculoskeletal:  No neck pain, back pain Integumentary: No rash, pruritus, skin lesions Neurological: as above Psychiatric: No depression, insomnia, anxiety Endocrine: No palpitations, fatigue, diaphoresis, mood swings, change in appetite, change in weight, increased thirst Hematologic/Lymphatic:  No anemia, purpura, petechiae. Allergic/Immunologic: no itchy/runny eyes, nasal congestion, recent allergic reactions, rashes  PHYSICAL EXAM: Vitals:   08/08/17 0848  BP: 126/74  Pulse: (!) 113  SpO2: 96%   General: No acute  distress Head:  Normocephalic/atraumatic Neck: supple, no paraspinal tenderness, full range of motion Heart:  Regular rate and rhythm Lungs:  Clear to auscultation bilaterally Back: No paraspinal tenderness Skin/Extremities: No rash, no edema Neurological Exam: alert and oriented to person, place, and time. No aphasia or dysarthria. Fund of knowledge is appropriate.  Recent and remote memory are intact. Attention and concentration are normal.    Able to name objects and repeat phrases. CDT 5/5 MMSE - Mini Mental State Exam 08/08/2017 11/01/2015  Orientation to time 5 5  Orientation to Place 5 5  Registration 3 3  Attention/ Calculation 5 5  Recall 3 1  Language- name 2 objects 2 2  Language- repeat 1 1  Language- follow 3 step command 2 3  Language- read & follow direction 1 1  Write a sentence 1 1  Copy design 1 1  Total score 29 28   Cranial nerves: Pupils equal, round, reactive to light.  Extraocular movements intact with no nystagmus. Visual fields full. Facial sensation intact. No facial asymmetry. Tongue, uvula, palate midline.  Motor: Bulk and tone normal, muscle strength 5/5 throughout with no pronator drift.  Sensation to light touch intact.  No extinction to double simultaneous stimulation.  Deep tendon reflexes 2+ throughout, toes downgoing.  Finger to nose testing intact.  Gait: he has left leg length discrepancy, no ataxia, mild difficulty with tandem walk but able. Romberg negative.  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 His wife reported episodes of shaking with left arm extension. MRI brain showed moderate atrophy, including in bilateral hippocampi, EEG x 2 normal. He self-reduced Keppra to 750mg  qhs and continues to deny any seizures since December 2016. He again expressed interest in tapering off medication. A  1-hour EEG will be ordered, if normal, he will start weaning off medication, understanding risk for breakthrough seizure with any medication change. He is aware of [place] driving laws to stop driving after a seizure until 6 months seizure-free. He expressed concern about memory, MMSE today 29/30, continue to monitor. We discussed the importance of physical exercise and brain stimulation exercises for brain health. He will follow-up in 6 months and knows to call for any changes.   Thank you for allowing me to participate in his care.  Please do not hesitate to call for any questions or concerns.  The  duration of this appointment visit was 25 minutes of face-to-face time with the patient.  Greater than 50% of this time was spent in counseling, explanation of diagnosis, planning of further management, and coordination of care.   Ellouise Newer, M.D.   CC: Dr. Lysle Rubens

## 2017-08-13 ENCOUNTER — Ambulatory Visit (INDEPENDENT_AMBULATORY_CARE_PROVIDER_SITE_OTHER): Payer: Medicare Other | Admitting: Neurology

## 2017-08-13 DIAGNOSIS — R413 Other amnesia: Secondary | ICD-10-CM

## 2017-08-13 DIAGNOSIS — G40009 Localization-related (focal) (partial) idiopathic epilepsy and epileptic syndromes with seizures of localized onset, not intractable, without status epilepticus: Secondary | ICD-10-CM | POA: Diagnosis not present

## 2017-08-17 NOTE — Procedures (Signed)
ELECTROENCEPHALOGRAM REPORT  Date of Study: 08/13/2017  Patient's Name: Wyatt Stein MRN: [id number] Date of Birth: [birth year]  Referring Provider: Dr. Ellouise Newer  Clinical History: This is an 83 year old man with recurrent episodes of shaking with left arm extension. He has been event-free for 2 years and interested in medication taper.  Medications: Keppra CALCIUM 500 +D 500-400 MG-UNIT TABS    VITAMIN D 1000 UNITS tablet  65 FE MG tablet  XALATAN 0.005 % ophthalmic solution   CLARITIN 10 MG tablet Magnesium 250 MG TABS    CENTRUM SILVER ADULT 50+ TABS  PRILOSEC OTC 20 MG tablet  LAMISIL 250 MG CYANOCOBALAMIN 1000 MCG tablet   Technical Summary: A multichannel digital 1-hourEEG recording measured by the international 10-20 system with electrodes applied with paste and impedances below 5000 ohms performed in our laboratory with EKG monitoring in an awake and asleep patient.  Hyperventilation was not performed. Photic stimulation was performed.  The digital EEG was referentially recorded, reformatted, and digitally filtered in a variety of bipolar and referential montages for optimal display.    Description: The patient is awake and asleep during the recording.  During maximal wakefulness, there is a symmetric, medium voltage 9 Hz posterior dominant rhythm that attenuates with eye opening.  The record is symmetric.  During drowsiness and sleep, there is an increase in theta slowing of the background with central beta activity seen. Deeper stages of sleep were not seen. Photic stimulation did not elicit any abnormalities.  There were no epileptiform discharges or electrographic seizures seen.    EKG lead was unremarkable.  Impression: This 1-hour awake and asleep EEG is normal.    Clinical Correlation: A normal EEG does not exclude a clinical diagnosis of epilepsy.  If further clinical questions remain, prolonged EEG may be helpful.  Clinical correlation is  advised.   Ellouise Newer, M.D.

## 2017-08-20 ENCOUNTER — Telehealth: Payer: Self-pay

## 2017-08-20 NOTE — Telephone Encounter (Signed)
-----   Message from Cameron Sprang, MD sent at 08/20/2017 10:50 AM EDT ----- Pls let him know the EEG is normal. He can start further reducing the Keppra to 1/2 tablet every night for a week, then stop. If he or his wife reports a seizure, then he needs to get back on the medication and would have to stop driving for 6 months. Thanks

## 2017-08-20 NOTE — Telephone Encounter (Signed)
Called pt.  No answer.  No answering machine or voicemail.  Unable to relay message below

## 2017-08-27 ENCOUNTER — Telehealth: Payer: Self-pay | Admitting: Neurology

## 2017-08-27 NOTE — Telephone Encounter (Signed)
Pt called and said he has not heard about the results of his MRI, please call and advise

## 2017-08-28 NOTE — Telephone Encounter (Signed)
Sorry, yes.... I just saw that.

## 2017-08-28 NOTE — Telephone Encounter (Signed)
Looks like you tried calling him on 10/8 but no answer

## 2017-08-28 NOTE — Telephone Encounter (Signed)
No resent MRI, but he did have an EEG on 10/1

## 2017-08-29 ENCOUNTER — Telehealth: Payer: Self-pay

## 2017-08-29 DIAGNOSIS — H401232 Low-tension glaucoma, bilateral, moderate stage: Secondary | ICD-10-CM | POA: Diagnosis not present

## 2017-08-29 DIAGNOSIS — H2513 Age-related nuclear cataract, bilateral: Secondary | ICD-10-CM | POA: Diagnosis not present

## 2017-08-29 NOTE — Telephone Encounter (Signed)
Spoke with pt relaying EEG results and Keppra lowering instructions.  Pt verbalized understanding.  Knows to call if there are any problems and to make it to his appointment scheduled in March.

## 2017-09-25 DIAGNOSIS — M79609 Pain in unspecified limb: Secondary | ICD-10-CM | POA: Diagnosis not present

## 2017-09-25 DIAGNOSIS — B351 Tinea unguium: Secondary | ICD-10-CM | POA: Diagnosis not present

## 2017-09-27 DIAGNOSIS — I10 Essential (primary) hypertension: Secondary | ICD-10-CM | POA: Diagnosis not present

## 2017-10-26 DIAGNOSIS — I10 Essential (primary) hypertension: Secondary | ICD-10-CM | POA: Diagnosis not present

## 2017-11-26 DIAGNOSIS — I10 Essential (primary) hypertension: Secondary | ICD-10-CM | POA: Diagnosis not present

## 2017-12-06 DIAGNOSIS — M79675 Pain in left toe(s): Secondary | ICD-10-CM | POA: Diagnosis not present

## 2017-12-06 DIAGNOSIS — B351 Tinea unguium: Secondary | ICD-10-CM | POA: Diagnosis not present

## 2017-12-06 DIAGNOSIS — M79674 Pain in right toe(s): Secondary | ICD-10-CM | POA: Diagnosis not present

## 2017-12-27 DIAGNOSIS — N183 Chronic kidney disease, stage 3 (moderate): Secondary | ICD-10-CM | POA: Diagnosis not present

## 2017-12-27 DIAGNOSIS — Z720 Tobacco use: Secondary | ICD-10-CM | POA: Diagnosis not present

## 2017-12-27 DIAGNOSIS — I129 Hypertensive chronic kidney disease with stage 1 through stage 4 chronic kidney disease, or unspecified chronic kidney disease: Secondary | ICD-10-CM | POA: Diagnosis not present

## 2018-01-31 DIAGNOSIS — F322 Major depressive disorder, single episode, severe without psychotic features: Secondary | ICD-10-CM | POA: Diagnosis not present

## 2018-01-31 DIAGNOSIS — E782 Mixed hyperlipidemia: Secondary | ICD-10-CM | POA: Diagnosis not present

## 2018-01-31 DIAGNOSIS — G621 Alcoholic polyneuropathy: Secondary | ICD-10-CM | POA: Diagnosis not present

## 2018-01-31 DIAGNOSIS — J449 Chronic obstructive pulmonary disease, unspecified: Secondary | ICD-10-CM | POA: Diagnosis not present

## 2018-01-31 DIAGNOSIS — Z0000 Encounter for general adult medical examination without abnormal findings: Secondary | ICD-10-CM | POA: Diagnosis not present

## 2018-01-31 DIAGNOSIS — R569 Unspecified convulsions: Secondary | ICD-10-CM | POA: Diagnosis not present

## 2018-01-31 DIAGNOSIS — D649 Anemia, unspecified: Secondary | ICD-10-CM | POA: Diagnosis not present

## 2018-01-31 DIAGNOSIS — D709 Neutropenia, unspecified: Secondary | ICD-10-CM | POA: Diagnosis not present

## 2018-01-31 DIAGNOSIS — Z1389 Encounter for screening for other disorder: Secondary | ICD-10-CM | POA: Diagnosis not present

## 2018-01-31 DIAGNOSIS — K219 Gastro-esophageal reflux disease without esophagitis: Secondary | ICD-10-CM | POA: Diagnosis not present

## 2018-01-31 DIAGNOSIS — I10 Essential (primary) hypertension: Secondary | ICD-10-CM | POA: Diagnosis not present

## 2018-01-31 DIAGNOSIS — E440 Moderate protein-calorie malnutrition: Secondary | ICD-10-CM | POA: Diagnosis not present

## 2018-01-31 DIAGNOSIS — N183 Chronic kidney disease, stage 3 (moderate): Secondary | ICD-10-CM | POA: Diagnosis not present

## 2018-01-31 DIAGNOSIS — R739 Hyperglycemia, unspecified: Secondary | ICD-10-CM | POA: Diagnosis not present

## 2018-01-31 DIAGNOSIS — C61 Malignant neoplasm of prostate: Secondary | ICD-10-CM | POA: Diagnosis not present

## 2018-02-06 ENCOUNTER — Ambulatory Visit (INDEPENDENT_AMBULATORY_CARE_PROVIDER_SITE_OTHER): Payer: Medicare Other | Admitting: Neurology

## 2018-02-06 ENCOUNTER — Encounter: Payer: Self-pay | Admitting: Neurology

## 2018-02-06 VITALS — BP 142/80 | Ht 68.0 in | Wt 144.0 lb

## 2018-02-06 DIAGNOSIS — G40009 Localization-related (focal) (partial) idiopathic epilepsy and epileptic syndromes with seizures of localized onset, not intractable, without status epilepticus: Secondary | ICD-10-CM | POA: Diagnosis not present

## 2018-02-06 NOTE — Patient Instructions (Signed)
Great seeing you! Follow-up in 6 months, call for any changes  Seizure Precautions: 1. If medication has been prescribed for you to prevent seizures, take it exactly as directed.  Do not stop taking the medicine without talking to your doctor first, even if you have not had a seizure in a long time.   2. Avoid activities in which a seizure would cause danger to yourself or to others.  Don't operate dangerous machinery, swim alone, or climb in high or dangerous places, such as on ladders, roofs, or girders.  Do not drive unless your doctor says you may.  3. If you have any warning that you may have a seizure, lay down in a safe place where you can't hurt yourself.    4.  No driving for 6 months from last seizure, as per [facility].   Please refer to the following link on the [place] website for more information: http://www.epilepsyfoundation.org/answerplace/Social/driving/drivingu.cfm   5.  Maintain good sleep hygiene. Avoid alcohol.  6.  Notify your neurology if you are planning pregnancy or if you become pregnant.  7.  Contact your doctor if you have any problems that may be related to the medicine you are taking.  8.  Call 911 and bring the patient back to the ED if:        A.  The seizure lasts longer than 5 minutes.       B.  The patient doesn't awaken shortly after the seizure  C.  The patient has new problems such as difficulty seeing, speaking or moving  D.  The patient was injured during the seizure  E.  The patient has a temperature over 102 F (39C)  F.  The patient vomited and now is having trouble breathing

## 2018-02-06 NOTE — Progress Notes (Signed)
NEUROLOGY FOLLOW UP OFFICE NOTE  Wyatt Stein [id number]  DOB: [birth year]  HISTORY OF PRESENT ILLNESS: I had the pleasure of seeing Wyatt Stein in follow-up in the neurology clinic on 02/06/2018. He is again alone in the office today. 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 He denies any seizures since December 2016 when his wife reported an episode of shaking (sober since October 2016). He expressed interest in tapering off Keppra being seizure-free He had a 1-hour wake and sleep EEG which was normal. He tapered off Keppra last October 2018 and denies any seizures or seizure-like symptoms. He is alone in the office and had previously been amnestic of seizures, but states that his wife has not told him of any seizures as well. He denies any further episodes of shaking or loss of consciousness. He denies any headaches, dizziness, diplopia, dysarthria, dysphagia, neck/back pain, bowel/bladder dysfunction. No olfactory/gustatory hallucinations, deja vu, myoclonic jerks. He denies any further episodes of feeling upset in the stomach. He denies any falls.   HPI: This is an 84 yo RH man with a history of hypertension, prostate cancer, alcohol abuse, sober since October 2016, who presented for evaluation of seizures. 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 His wife has witnessed the episodes and provides details on the history. Records from neurologist Dr. [name] who he had seen in 2014 were reviewed as well. It appears he started having recurrent episodes of loss of awareness/consciousness in 2012 or so. His wife describes him to look like he is getting ready to sleep with head slumping forward, followed by violent shaking with stiffness for a few minutes. In the past, she has described his left arm to be extended and stiff. In the past, she reported he would be confused and out of it for 5-10  minutes after, but today reports that he is lethargic afterward but not confused. He looks like he wants to vomit after. He denies these episodes occur, but does report that when his wife tells him he had a seizure, it is because his stomach becomes upset, "always starts from my stomach," then after he needs to have a bowel movement. Per wife, he has had urinary and bowel incontinence with these episodes. They mostly occur when sitting, usually early in the morning, but his wife feels that the fall last October leading to his left hip fracture was due to a seizure because she found him with foam around his mouth. 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 He has had 2 routine EEGs, normal wake EEG in 08/2013 and normal awake and drowsy EEG in 08/2015. He was evaluated by Neurology at that time and discharged home on Keppra 500mg  BID.   His wife reports that she had seen similar episodes when he was consuming large amounts of alcohol, but has witnessed 4 more similar spells since October 2016 and they both report he has been sober since then. In the past, she has witnessed him have similar shaking episodes but he would be more alert and talking, but that he had some alcohol at that time. She reports that prior to October 2016, she had seen around 3-4 episodes over a period of 6-7 months. Since the last shaking episode on 10/20/15, Keppra dose  was increased by his PCP to 750mg  BID, no episodes since then. He is tolerating medication without any side effects. His wife reports that after his hospitalization/SNF stay, his memory is much better, however he is less talkative, hence she does not know how he is feeling. He feels she is being overprotective. She is worried he will start drinking again.    He had a normal birth and early development. There  is no history of febrile convulsions, CNS infections such as meningitis/encephalitis, significant traumatic brain injury, neurosurgical procedures, or family history of seizures. He has 2 uncles who had tremors.   PAST MEDICAL HISTORY: Past Medical History:  Diagnosis Date  . Cancer [facility]) 2008   prostate, sees dr [name] once a year  . COPD (chronic obstructive pulmonary disease) ([place])   . GERD (gastroesophageal reflux disease)   . History of blood transfusion 2006  . Hypertension   . PONV (postoperative nausea and vomiting) 40 yrs ago   aspiration pneumonia after tooth extraxtion    MEDICATIONS:  Outpatient Encounter Medications as of 02/06/2018  Medication Sig Note  . amLODipine (NORVASC) 5 MG tablet Take 5 mg by mouth daily.   . Calcium Carb-Cholecalciferol (CALCIUM 500 +D) 500-400 MG-UNIT TABS Take by mouth 2 (two) times daily.   . cholecalciferol (VITAMIN D) 1000 UNITS tablet Take 1,000 Units by mouth daily.   . CVS B-1 100 MG tablet Take 100 mg by mouth daily.   . ferrous sulfate 325 (65 FE) MG tablet Take 325 mg by mouth daily with breakfast.   . hydrochlorothiazide (HYDRODIURIL) 12.5 MG tablet Take 12.5 mg by mouth every morning.   . latanoprost (XALATAN) 0.005 % ophthalmic solution INSTILL 1 DROP(S) DROP INTO EACH EYE ONCE A DAY IN THE PM IN [facility] EYE 11/01/2015: Received from: External Pharmacy  . loratadine (CLARITIN) 10 MG tablet Take 10 mg by mouth daily.   . Magnesium 250 MG TABS Take by mouth.   . Multiple Vitamins-Minerals (CENTRUM SILVER ADULT 50+) TABS Take 1 tablet by mouth daily.   . vitamin B-12 (CYANOCOBALAMIN) 1000 MCG tablet Take 1,000 mcg by mouth daily.   . [DISCONTINUED] levETIRAcetam (KEPPRA) 750 MG tablet Take 1 tablet (750 mg total) by mouth 2 (two) times daily.    No facility-administered encounter medications on file as of 02/06/2018.     ALLERGIES: Allergies  Allergen Reactions  . Ace Inhibitors Anaphylaxis and Swelling  . Ramipril Anaphylaxis and  Itching    FAMILY HISTORY: Family History  Problem Relation Age of Onset  . Heart Problems Mother   . Colon cancer Father     SOCIAL HISTORY: Social History   Socioeconomic History  . Marital status: Married    Spouse name: Wyatt Stein   . Number of children: 2  . Years of education: College   . Highest education level: Not on file  Occupational History  . Occupation: Retired   Scientific laboratory technician  . Financial resource strain: Not on file  . Food insecurity:    Worry: Not on file    Inability: Not on file  . Transportation needs:    Medical: Not on file    Non-medical: Not on file  Tobacco Use  . Smoking status: Current Every Day Smoker    Packs/day: 0.50    Years: 30.00    Pack years: 15.00    Types: Cigarettes  . Smokeless tobacco: Never Used  Substance and Sexual Activity  . Alcohol use: No    Alcohol/week: 8.4 oz  Types: 14 Shots of liquor per week    Comment: Former--pt states none since Oct  . Drug use: No  . Sexual activity: Yes    Birth control/protection: None  Lifestyle  . Physical activity:    Days per week: Not on file    Minutes per session: Not on file  . Stress: Not on file  Relationships  . Social connections:    Talks on phone: Not on file    Gets together: Not on file    Attends religious service: Not on file    Active member of club or organization: Not on file    Attends meetings of clubs or organizations: Not on file    Relationship status: Not on file  . Intimate partner violence:    Fear of current or ex partner: Not on file    Emotionally abused: Not on file    Physically abused: Not on file    Forced sexual activity: Not on file  Other Topics Concern  . Not on file  Social History Narrative   Patient lives at home with wife.   Patient is retired.    Patient is a college grad.    Patient has 2 children.     REVIEW OF SYSTEMS: Constitutional: No fevers, chills, or sweats, no generalized fatigue, change in appetite Eyes: No visual  changes, double vision, eye pain Ear, nose and throat: No hearing loss, ear pain, nasal congestion, sore throat Cardiovascular: No chest pain, palpitations Respiratory:  No shortness of breath at rest or with exertion, wheezes GastrointestinaI: No nausea, vomiting, diarrhea, abdominal pain, fecal incontinence Genitourinary:  No dysuria, urinary retention or frequency Musculoskeletal:  No neck pain, back pain Integumentary: No rash, pruritus, skin lesions Neurological: as above Psychiatric: No depression, insomnia, anxiety Endocrine: No palpitations, fatigue, diaphoresis, mood swings, change in appetite, change in weight, increased thirst Hematologic/Lymphatic:  No anemia, purpura, petechiae. Allergic/Immunologic: no itchy/runny eyes, nasal congestion, recent allergic reactions, rashes  PHYSICAL EXAM: Vitals:   02/06/18 0835  BP: (!) 142/80   General: No acute distress Head:  Normocephalic/atraumatic Neck: supple, no paraspinal tenderness, full range of motion Heart:  Regular rate and rhythm Lungs:  Clear to auscultation bilaterally Back: No paraspinal tenderness Skin/Extremities: No rash, no edema Neurological Exam: alert and oriented to person, place, and time. No aphasia or dysarthria. Fund of knowledge is appropriate.  Recent and remote memory are intact. Attention and concentration are normal.    Able to name objects and repeat phrases. Cranial nerves: Pupils equal, round, reactive to light.  Extraocular movements intact with no nystagmus. Visual fields full. Facial sensation intact. No facial asymmetry. Tongue, uvula, palate midline.  Motor: Bulk and tone normal, muscle strength 5/5 throughout with no pronator drift.  Sensation to light touch intact.  No extinction to double simultaneous stimulation.  Deep tendon reflexes 2+ throughout, toes downgoing.  Finger to nose testing intact.  Gait: he has left leg length discrepancy, no ataxia, mild difficulty with tandem walk but able.  Romberg negative.  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 His wife reported episodes of shaking with left arm extension. MRI brain showed moderate atrophy, including in bilateral hippocampi, EEG x 3 normal. With most recent normal EEG, he had expressed desire to taper off Keppra since he has been seizure-free since December 2016. He denies any seizures off  medication the past 6 months and reports doing well. He is aware of [place] driving laws to stop driving after a seizure until 6 months seizure-free. He will follow-up in 6 months and knows to call for any changes.   Thank you for allowing me to participate in his care.  Please do not hesitate to call for any questions or concerns.  The duration of this appointment visit was 15 minutes of face-to-face time with the patient.  Greater than 50% of this time was spent in counseling, explanation of diagnosis, planning of further management, and coordination of care.   Ellouise Newer, M.D.   CC: Dr. Lysle Rubens

## 2018-02-26 DIAGNOSIS — M79674 Pain in right toe(s): Secondary | ICD-10-CM | POA: Diagnosis not present

## 2018-02-26 DIAGNOSIS — B351 Tinea unguium: Secondary | ICD-10-CM | POA: Diagnosis not present

## 2018-02-26 DIAGNOSIS — M79675 Pain in left toe(s): Secondary | ICD-10-CM | POA: Diagnosis not present

## 2018-03-25 DIAGNOSIS — F17210 Nicotine dependence, cigarettes, uncomplicated: Secondary | ICD-10-CM | POA: Diagnosis not present

## 2018-05-07 DIAGNOSIS — B351 Tinea unguium: Secondary | ICD-10-CM | POA: Diagnosis not present

## 2018-05-07 DIAGNOSIS — I739 Peripheral vascular disease, unspecified: Secondary | ICD-10-CM | POA: Diagnosis not present

## 2018-06-19 DIAGNOSIS — J449 Chronic obstructive pulmonary disease, unspecified: Secondary | ICD-10-CM | POA: Diagnosis not present

## 2018-06-19 DIAGNOSIS — R05 Cough: Secondary | ICD-10-CM | POA: Diagnosis not present

## 2018-06-19 DIAGNOSIS — R195 Other fecal abnormalities: Secondary | ICD-10-CM | POA: Diagnosis not present

## 2018-06-20 ENCOUNTER — Other Ambulatory Visit: Payer: Self-pay | Admitting: Internal Medicine

## 2018-06-20 ENCOUNTER — Ambulatory Visit
Admission: RE | Admit: 2018-06-20 | Discharge: 2018-06-20 | Disposition: A | Discharge status: Home / Self Care | Payer: Medicare Other | Source: Ambulatory Visit | Attending: Internal Medicine | Admitting: Internal Medicine

## 2018-06-20 DIAGNOSIS — R059 Cough, unspecified: Secondary | ICD-10-CM

## 2018-06-20 DIAGNOSIS — R05 Cough: Secondary | ICD-10-CM

## 2018-06-20 DIAGNOSIS — R222 Localized swelling, mass and lump, trunk: Secondary | ICD-10-CM

## 2018-06-21 ENCOUNTER — Ambulatory Visit
Admission: RE | Admit: 2018-06-21 | Discharge: 2018-06-21 | Disposition: A | Discharge status: Home / Self Care | Payer: Medicare Other | Source: Ambulatory Visit | Attending: Internal Medicine | Admitting: Internal Medicine

## 2018-06-21 ENCOUNTER — Encounter: Payer: Self-pay | Admitting: Radiology

## 2018-06-21 DIAGNOSIS — R918 Other nonspecific abnormal finding of lung field: Secondary | ICD-10-CM | POA: Diagnosis not present

## 2018-06-21 DIAGNOSIS — R222 Localized swelling, mass and lump, trunk: Secondary | ICD-10-CM

## 2018-06-21 MED ORDER — IOPAMIDOL (ISOVUE-300) INJECTION 61%
50.0000 mL | Freq: Once | INTRAVENOUS | Status: AC | PRN
  Administered 2018-06-21: 50 mL via INTRAVENOUS

## 2018-06-24 DIAGNOSIS — R918 Other nonspecific abnormal finding of lung field: Secondary | ICD-10-CM | POA: Diagnosis not present

## 2018-06-26 ENCOUNTER — Ambulatory Visit (INDEPENDENT_AMBULATORY_CARE_PROVIDER_SITE_OTHER): Payer: Medicare Other | Admitting: Internal Medicine

## 2018-06-26 ENCOUNTER — Encounter: Payer: Self-pay | Admitting: Internal Medicine

## 2018-06-26 VITALS — BP 122/74 | HR 80 | Ht 68.0 in | Wt 136.0 lb

## 2018-06-26 DIAGNOSIS — R918 Other nonspecific abnormal finding of lung field: Secondary | ICD-10-CM | POA: Insufficient documentation

## 2018-06-26 DIAGNOSIS — F17210 Nicotine dependence, cigarettes, uncomplicated: Secondary | ICD-10-CM | POA: Diagnosis not present

## 2018-06-26 DIAGNOSIS — J449 Chronic obstructive pulmonary disease, unspecified: Secondary | ICD-10-CM | POA: Diagnosis not present

## 2018-06-26 NOTE — Patient Instructions (Addendum)
Come to outpatient registration at [facility] (behind the ER) at 27 am Tuesday 8/20  with nothing to eat or drink after midnight Monday for Bronchoscopy with biopsy.

## 2018-06-26 NOTE — Progress Notes (Signed)
Doreatha Massed, male    DOB: 07/05/1934,    MRN: [id number]    Brief patient profile:  39 yobm active smoker with wt loss x one year and cough x early July 2019 > cxr > CT c/w lung mass > referred to pulmonary clinic 06/26/2018 by Dr   Lysle Rubens   History of Present Illness  06/26/2018 1st pulmonary office eval/ Meika Earll  Chief Complaint  Patient presents with  . Pulmonary Consult    Referred by Dr. Deforest Hoyles for eval of lung mass. Pt c/o cough for the past 4-5 wks- occ prod with clear sputum.    wt loss and cough onsets were insidious, with cough persistent daily x 5 weeks min mucoid sputum esp in am's s cp or hemoptysis or dysphagia.   Better with rob dm/ no on inhalers and Not limited by breathing from desired activities    No obvious day to day or daytime variability or assoc  purulent sputum or mucus plugs or hemoptysis or cp or chest tightness, subjective wheeze or overt sinus or hb symptoms.   Sleeping 1 pillow ok  without nocturnal  or early am exacerbation  of respiratory  c/o's or need for noct saba. Also denies any obvious fluctuation of symptoms with weather or environmental changes or other aggravating or alleviating factors except as outlined above   No unusual exposure hx or h/o childhood pna/ asthma or knowledge of premature birth.  Current Allergies, Complete Past Medical History, Past Surgical History, Family History, and Social History were reviewed in [employer] record.  ROS  The following are not active complaints unless bolded Hoarseness, sore throat, dysphagia, dental problems, itching, sneezing,  nasal congestion or discharge of excess mucus or purulent secretions, ear ache,   fever, chills, sweats, unintended wt loss or wt gain, classically pleuritic or exertional cp,  orthopnea pnd or arm/hand swelling  or leg swelling, presyncope, palpitations, abdominal pain, anorexia, nausea, vomiting, diarrhea  or change in bowel habits or change in bladder  habits, change in stools or change in urine, dysuria, hematuria,  rash, arthralgias, visual complaints, headache, numbness, weakness or ataxia or problems with walking or coordination,  change in mood or  memory.               Past Medical History:  Diagnosis Date  . Cancer [facility]) 2008   prostate, sees dr [name] once a year  . COPD (chronic obstructive pulmonary disease) ([place])   . GERD (gastroesophageal reflux disease)   . History of blood transfusion 2006  . Hypertension   . PONV (postoperative nausea and vomiting) 40 yrs ago   aspiration pneumonia after tooth extraxtion    Outpatient Medications Prior to Visit  Medication Sig Dispense Refill  . amLODipine (NORVASC) 5 MG tablet Take 5 mg by mouth daily.  5  . benzonatate (TESSALON) 200 MG capsule Take 1 capsule by mouth 3 (three) times daily as needed.  0  . cholecalciferol (VITAMIN D) 1000 UNITS tablet Take 1,000 Units by mouth daily.    . CVS B-1 100 MG tablet Take 100 mg by mouth daily.  11  . ferrous sulfate 325 (65 FE) MG tablet Take 325 mg by mouth daily with breakfast.    . hydrochlorothiazide (HYDRODIURIL) 12.5 MG tablet Take 12.5 mg by mouth every morning.  5  . latanoprost (XALATAN) 0.005 % ophthalmic solution INSTILL 1 DROP(S) DROP INTO EACH EYE ONCE A DAY IN THE PM IN [facility] EYE  12  .  Magnesium 250 MG TABS Take by mouth.    . Multiple Vitamins-Minerals (CENTRUM SILVER ADULT 50+) TABS Take 1 tablet by mouth daily.    . vitamin B-12 (CYANOCOBALAMIN) 1000 MCG tablet Take 1,000 mcg by mouth daily.    . Calcium Carb-Cholecalciferol (CALCIUM 500 +D) 500-400 MG-UNIT TABS Take by mouth 2 (two) times daily.    Marland Kitchen loratadine (CLARITIN) 10 MG tablet Take 10 mg by mouth daily.     No facility-administered medications prior to visit.               Objective:     BP 122/74 (BP Location: Left Arm, Cuff Size: Normal)   Pulse 80   Ht 5\' 8"  (1.727 m)   Wt 136 lb (61.7 kg)   SpO2 100%   BMI 20.68 kg/m   SpO2: 100 %   RA  Slow moving pleasant amb bm nad   HEENT: nl dentition, turbinates bilaterally, and oropharynx. Nl external ear canals without cough reflex   NECK :  without JVD/Nodes/TM/ nl carotid upstrokes bilaterally   LUNGS: no acc muscle use,  Nl contour chest which is clear to A and P bilaterally without cough on insp or exp maneuvers   CV:  RRR  no s3 or murmur or increase in P2, and no edema   ABD:  soft and nontender with nl inspiratory excursion in the supine position. No bruits or organomegaly appreciated, bowel sounds nl  MS:  Nl gait/ ext warm with muscle wasting but without other deformities, calf tenderness, cyanosis or clubbing No obvious joint restrictions   SKIN: warm and dry without lesions    NEURO:  alert, approp, nl sensorium with  no motor or cerebellar deficits apparent.     I personally reviewed images and agree with radiology impression as follows:   Chest CT contrast 06/21/18 Large RIGHT upper lobe mass 9.6 x 5.5 x 8.4 cm in size consistent with a primary pulmonary neoplasm. Mass abuts the anterior chest wall, mediastinum and superior RIGHT hilum and demonstrates central necrosis. Postobstructive pneumonitis versus lymphangitic tumor spread in RIGHT upper lobe. Underlying pan lobular emphysema. Tiny scattered nonspecific ground-glass opacities in the lower lobes. Mediastinal adenopathy as above. Aortic Atherosclerosis (ICD10-I70.0) and Emphysema (ICD10-J43.9).      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  - FOB 07/02/18 >>   Dian Situ out in "cartoon" fashion a Tracheobronchial tree using an "upside down hollow tree" as an example of the problem he's facing and the need to obtain a tissue dx to sort out what to do about it   Discussed in detail all the  indications, usual  risks and alternatives  relative to the benefits with patient who agrees  to proceed with bronchoscopy with biopsy tentatively set for 07/02/18    COPD GOLD 0/ still smoking Spirometry 06/26/2018  FEV1 2.38 (100%)  Ratio 73 with mild curvature on no rx     I reviewed the Fletcher curve with the patient that basically indicates  if you quit smoking when your best day FEV1 is still well preserved (as is clearly  the case here)  it is highly unlikely you will progress to severe disease and informed the patient there was  no medication on the market that has proven to alter the curve/ its downward trajectory  or the likelihood of progression of their disease(unlike other chronic medical  conditions such as atheroclerosis where we do think we can change the natural hx with risk reducing meds)    Therefore stopping smoking and maintaining abstinence are  the most important aspects of his care, not choice of inhalers or for that matter, doctors.   Treatment other than smoking cessation  is entirely directed by severity of symptoms and focused also on reducing exacerbations, not attempting to change the natural history of the disease.   Since the cough is likely from the airway involvement by tumor and he is Not limited by breathing from desired activities there is no advantage to any form or pulmonary rx here.   Cigarette smoker 4-5 min discussion re active cigarette smoking in addition to office E&M  Ask about tobacco use:   ongoing Advise quitting   I emphasized that although we never turn away smokers from the pulmonary clinic, we do ask that they understand that the recommendations that we make  won't work nearly as well in the presence of continued cigarette exposure. In fact, we may very well  reach a point where we can't promise to help the patient if he/she can't quit smoking. (We can and will promise to try to help, we just can't promise what we recommend will really work)  Assess willingness:  Not committed at this point Assist in quit attempt:  Per PCP when ready Arrange  follow up:   Follow up per Primary Care planned        Total time devoted to counseling  > 50 % of initial 60 min office visit:  review case with pt/wife (very supportive) discussion of options/alternatives/ personally creating written customized instructions  in presence of pt  then going over those specific  Instructions directly with the pt including how to use all of the meds but in particular covering each new medication in detail and the difference between the maintenance= "automatic" meds and the prns using an action plan format for the latter (If this problem/symptom => do that organization reading Left to right).  Please see AVS from this visit for a full list of these instructions which I personally wrote for this pt and  are unique to this visit.          Christinia Gully, MD 06/26/2018

## 2018-06-27 ENCOUNTER — Encounter: Payer: Self-pay | Admitting: Internal Medicine

## 2018-06-27 DIAGNOSIS — F17210 Nicotine dependence, cigarettes, uncomplicated: Secondary | ICD-10-CM | POA: Insufficient documentation

## 2018-06-27 NOTE — Assessment & Plan Note (Addendum)
4-5 min discussion re active cigarette smoking in addition to office E&M  Ask about tobacco use:   ongoing Advise quitting   I emphasized that although we never turn away smokers from the pulmonary clinic, we do ask that they understand that the recommendations that we make  won't work nearly as well in the presence of continued cigarette exposure. In fact, we may very well  reach a point where we can't promise to help the patient if he/she can't quit smoking. (We can and will promise to try to help, we just can't promise what we recommend will really work)  Assess willingness:  Not committed at this point Assist in quit attempt:  Per PCP when ready Arrange follow up:   Follow up per Primary Care planned        Total time devoted to counseling  > 50 % of initial 60 min office visit:  review case with pt/wife (very supportive) discussion of options/alternatives/ personally creating written customized instructions  in presence of pt  then going over those specific  Instructions directly with the pt including how to use all of the meds but in particular covering each new medication in detail and the difference between the maintenance= "automatic" meds and the prns using an action plan format for the latter (If this problem/symptom => do that organization reading Left to right).  Please see AVS from this visit for a full list of these instructions which I personally wrote for this pt and  are unique to this visit.

## 2018-06-27 NOTE — Assessment & Plan Note (Signed)
Spirometry 06/26/2018  FEV1 2.38 (100%)  Ratio 73 with mild curvature on no rx     I reviewed the Fletcher curve with the patient that basically indicates  if you quit smoking when your best day FEV1 is still well preserved (as is clearly  the case here)  it is highly unlikely you will progress to severe disease and informed the patient there was  no medication on the market that has proven to alter the curve/ its downward trajectory  or the likelihood of progression of their disease(unlike other chronic medical conditions such as atheroclerosis where we do think we can change the natural hx with risk reducing meds)    Therefore stopping smoking and maintaining abstinence are  the most important aspects of his care, not choice of inhalers or for that matter, doctors.   Treatment other than smoking cessation  is entirely directed by severity of symptoms and focused also on reducing exacerbations, not attempting to change the natural history of the disease.   Since the cough is likely from the airway involvement by tumor and he is Not limited by breathing from desired activities there is no advantage to any form or pulmonary rx here.

## 2018-06-27 NOTE — Assessment & Plan Note (Addendum)
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

## 2018-07-01 NOTE — H&P (Signed)
Doreatha Massed, male    DOB: Dec 30, 1933,    MRN: [id number]    Brief patient profile:  81 yobm active smoker with wt loss x one year and cough x early July 2019 > cxr > CT c/w lung mass > referred to pulmonary clinic 06/26/2018 by Dr   Lysle Rubens   History of Present Illness  06/26/2018 1st pulmonary office eval/ Yarnell Arvidson      Chief Complaint  Patient presents with  . Pulmonary Consult    Referred by Dr. Deforest Hoyles for eval of lung mass. Pt c/o cough for the past 4-5 wks- occ prod with clear sputum.    wt loss and cough onsets were insidious, with cough persistent daily x 5 weeks min mucoid sputum esp in am's s cp or hemoptysis or dysphagia.   Better with rob dm/ no on inhalers and Not limited by breathing from desired activities    No obvious day to day or daytime variability or assoc  purulent sputum or mucus plugs or hemoptysis or cp or chest tightness, subjective wheeze or overt sinus or hb symptoms.   Sleeping 1 pillow ok  without nocturnal  or early am exacerbation  of respiratory  c/o's or need for noct saba. Also denies any obvious fluctuation of symptoms with weather or environmental changes or other aggravating or alleviating factors except as outlined above   No unusual exposure hx or h/o childhood pna/ asthma or knowledge of premature birth.  Current Allergies, Complete Past Medical History, Past Surgical History, Family History, and Social History were reviewed in [employer] record.  ROS  The following are not active complaints unless bolded Hoarseness, sore throat, dysphagia, dental problems, itching, sneezing,  nasal congestion or discharge of excess mucus or purulent secretions, ear ache,   fever, chills, sweats, unintended wt loss or wt gain, classically pleuritic or exertional cp,  orthopnea pnd or arm/hand swelling  or leg swelling, presyncope, palpitations, abdominal pain, anorexia, nausea, vomiting, diarrhea  or change in bowel habits or change in  bladder habits, change in stools or change in urine, dysuria, hematuria,  rash, arthralgias, visual complaints, headache, numbness, weakness or ataxia or problems with walking or coordination,  change in mood or  memory.                   Past Medical History:  Diagnosis Date  . Cancer [facility]) 2008   prostate, sees dr [name] once a year  . COPD (chronic obstructive pulmonary disease) ([place])   . GERD (gastroesophageal reflux disease)   . History of blood transfusion 2006  . Hypertension   . PONV (postoperative nausea and vomiting) 40 yrs ago   aspiration pneumonia after tooth extraxtion          Outpatient Medications Prior to Visit  Medication Sig Dispense Refill  . amLODipine (NORVASC) 5 MG tablet Take 5 mg by mouth daily.  5  . benzonatate (TESSALON) 200 MG capsule Take 1 capsule by mouth 3 (three) times daily as needed.  0  . cholecalciferol (VITAMIN D) 1000 UNITS tablet Take 1,000 Units by mouth daily.    . [employer] B-1 100 MG tablet Take 100 mg by mouth daily.  11  . ferrous sulfate 325 (65 FE) MG tablet Take 325 mg by mouth daily with breakfast.    . hydrochlorothiazide (HYDRODIURIL) 12.5 MG tablet Take 12.5 mg by mouth every morning.  5  . latanoprost (XALATAN) 0.005 % ophthalmic solution INSTILL 1 DROP(S) DROP INTO EACH EYE  ONCE A DAY IN THE PM IN [facility] EYE  12  . Magnesium 250 MG TABS Take by mouth.    . Multiple Vitamins-Minerals (CENTRUM SILVER ADULT 50+) TABS Take 1 tablet by mouth daily.    . vitamin B-12 (CYANOCOBALAMIN) 1000 MCG tablet Take 1,000 mcg by mouth daily.    . Calcium Carb-Cholecalciferol (CALCIUM 500 +D) 500-400 MG-UNIT TABS Take by mouth 2 (two) times daily.    Marland Kitchen loratadine (CLARITIN) 10 MG tablet Take 10 mg by mouth daily.     No facility-administered medications prior to visit.               Objective:   BP 122/74 (BP Location: Left Arm, Cuff Size: Normal)   Pulse 80   Ht 5\' 8"  (1.727 m)   Wt 136 lb (61.7 kg)    SpO2 100%   BMI 20.68 kg/m   SpO2: 100 %  RA  Slow moving pleasant amb bm nad   HEENT: nl dentition, turbinates bilaterally, and oropharynx. Nl external ear canals without cough reflex   NECK :  without JVD/Nodes/TM/ nl carotid upstrokes bilaterally   LUNGS: no acc muscle use,  Nl contour chest which is clear to A and P bilaterally without cough on insp or exp maneuvers   CV:  RRR  no s3 or murmur or increase in P2, and no edema   ABD:  soft and nontender with nl inspiratory excursion in the supine position. No bruits or organomegaly appreciated, bowel sounds nl  MS:  Nl gait/ ext warm with muscle wasting but without other deformities, calf tenderness, cyanosis or clubbing No obvious joint restrictions   SKIN: warm and dry without lesions    NEURO:  alert, approp, nl sensorium with  no motor or cerebellar deficits apparent.     I personally reviewed images and agree with radiology impression as follows:   Chest CT contrast 06/21/18 Large RIGHT upper lobe mass 9.6 x 5.5 x 8.4 cm in size consistent with a primary pulmonary neoplasm. Mass abuts the anterior chest wall, mediastinum and superior RIGHT hilum and demonstrates central necrosis. Postobstructive pneumonitis versus lymphangitic tumor spread in RIGHT upper lobe. Underlying pan lobular emphysema. Tiny scattered nonspecific ground-glass opacities in the lower lobes. Mediastinal adenopathy as above. Aortic Atherosclerosis (ICD10-I70.0) and Emphysema (ICD10-J43.9).      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      Christinia Gully, MD Pulmonary and [place] [phone] After 5:30 PM or weekends, use [facility] [phone]

## 2018-07-02 ENCOUNTER — Encounter (HOSPITAL_COMMUNITY)
Admission: RE | Disposition: A | Discharge status: Home / Self Care | Payer: Self-pay | Source: Ambulatory Visit | Attending: Internal Medicine

## 2018-07-02 ENCOUNTER — Ambulatory Visit (HOSPITAL_COMMUNITY)
Admission: RE | Admit: 2018-07-02 | Discharge: 2018-07-02 | Disposition: A | Discharge status: Home / Self Care | Payer: Medicare Other | Source: Ambulatory Visit | Attending: Internal Medicine | Admitting: Internal Medicine

## 2018-07-02 ENCOUNTER — Encounter (HOSPITAL_COMMUNITY): Payer: Self-pay | Admitting: Respiratory Therapy

## 2018-07-02 DIAGNOSIS — J439 Emphysema, unspecified: Secondary | ICD-10-CM | POA: Diagnosis not present

## 2018-07-02 DIAGNOSIS — R590 Localized enlarged lymph nodes: Secondary | ICD-10-CM | POA: Insufficient documentation

## 2018-07-02 DIAGNOSIS — Z79899 Other long term (current) drug therapy: Secondary | ICD-10-CM | POA: Diagnosis not present

## 2018-07-02 DIAGNOSIS — R918 Other nonspecific abnormal finding of lung field: Secondary | ICD-10-CM | POA: Diagnosis not present

## 2018-07-02 DIAGNOSIS — R846 Abnormal cytological findings in specimens from respiratory organs and thorax: Secondary | ICD-10-CM | POA: Diagnosis not present

## 2018-07-02 DIAGNOSIS — J449 Chronic obstructive pulmonary disease, unspecified: Secondary | ICD-10-CM | POA: Diagnosis present

## 2018-07-02 DIAGNOSIS — I10 Essential (primary) hypertension: Secondary | ICD-10-CM | POA: Diagnosis not present

## 2018-07-02 DIAGNOSIS — Z8546 Personal history of malignant neoplasm of prostate: Secondary | ICD-10-CM | POA: Insufficient documentation

## 2018-07-02 DIAGNOSIS — J9809 Other diseases of bronchus, not elsewhere classified: Secondary | ICD-10-CM | POA: Diagnosis not present

## 2018-07-02 HISTORY — PX: VIDEO BRONCHOSCOPY: SHX5072

## 2018-07-02 SURGERY — BRONCHOSCOPY, WITH FLUOROSCOPY
Anesthesia: Moderate Sedation | Laterality: Bilateral

## 2018-07-02 MED ORDER — PHENYLEPHRINE HCL 0.25 % NA SOLN: 1.0000 | Freq: Four times a day (QID) | NASAL | Status: DC | PRN

## 2018-07-02 MED ORDER — LIDOCAINE HCL URETHRAL/MUCOSAL 2 % EX GEL
CUTANEOUS | Status: DC | PRN
  Administered 2018-07-02: 1

## 2018-07-02 MED ORDER — MEPERIDINE HCL 100 MG/ML IJ SOLN: 100.0000 mg | Freq: Once | INTRAMUSCULAR | Status: DC

## 2018-07-02 MED ORDER — PHENYLEPHRINE HCL 0.25 % NA SOLN
NASAL | Status: DC | PRN
  Administered 2018-07-02: 2 via NASAL

## 2018-07-02 MED ORDER — LIDOCAINE HCL 1 % IJ SOLN
INTRAMUSCULAR | Status: DC | PRN
  Administered 2018-07-02: 6 mL via RESPIRATORY_TRACT

## 2018-07-02 MED ORDER — MEPERIDINE HCL 100 MG/ML IJ SOLN
INTRAMUSCULAR | Status: AC
  Filled 2018-07-02: qty 2

## 2018-07-02 MED ORDER — MIDAZOLAM HCL 5 MG/ML IJ SOLN
INTRAMUSCULAR | Status: AC
  Filled 2018-07-02: qty 2

## 2018-07-02 MED ORDER — SODIUM CHLORIDE 0.9 % IV SOLN
Freq: Once | INTRAVENOUS | Status: AC
  Administered 2018-07-02: 08:00:00 via INTRAVENOUS

## 2018-07-02 MED ORDER — MIDAZOLAM HCL 10 MG/2ML IJ SOLN
INTRAMUSCULAR | Status: DC | PRN
  Administered 2018-07-02: 2.5 mg via INTRAVENOUS

## 2018-07-02 MED ORDER — LIDOCAINE HCL 2 % EX GEL
2.0000 "application " | Freq: Once | CUTANEOUS | Status: DC
  Filled 2018-07-02: qty 5

## 2018-07-02 MED ORDER — MIDAZOLAM HCL 5 MG/ML IJ SOLN: 1.0000 mg | Freq: Once | INTRAMUSCULAR | Status: DC

## 2018-07-02 MED ORDER — MEPERIDINE HCL 25 MG/ML IJ SOLN
INTRAMUSCULAR | Status: DC | PRN
  Administered 2018-07-02: 25 mg via INTRAVENOUS

## 2018-07-02 NOTE — Discharge Instructions (Signed)
Flexible Bronchoscopy, Care After These instructions give you information on caring for yourself after your procedure. Your doctor may also give you more specific instructions. Call your doctor if you have any problems or questions after your procedure. Follow these instructions at home:  Do not eat or drink anything for 2 hours after your procedure. If you try to eat or drink before the medicine wears off, food or drink could go into your lungs. You could also burn yourself.  After 2 hours have passed and when you can cough and gag normally, you may eat soft food and drink liquids slowly.  The day after the test, you may eat your normal diet.  You may do your normal activities.  Keep all doctor visits. Get help right away if:  You get more and more short of breath.  You get light-headed.  You feel like you are going to pass out (faint).  You have chest pain.  You have new problems that worry you.  You cough up more than a little blood.  You cough up more blood than before.  Nothing to eat or drink until 10:30 am on 07/02/2018.  This information is not intended to replace advice given to you by your health care provider. Make sure you discuss any questions you have with your health care provider. Document Released: 08/27/2009 Document Revised: 04/06/2016 Document Reviewed: 07/04/2013 Elsevier Interactive Patient Education  2017 [employer].

## 2018-07-02 NOTE — Op Note (Signed)
Bronchoscopy Procedure Note  Date of Operation: 07/02/2018   Pre-op Diagnosis: RUL mass  Post-op Diagnosis: same  Surgeon: Christinia Gully  Anesthesia: Monitored Local Anesthesia with Sedation Time Started:  0814 versed 2.5 mg IV/ demerol 25 mg IV  Time Stopped:  0825  Operation: Video Flexible fiberoptic bronchoscopy, diagnostic   Findings: mass prolapsing out of RUL apical segment completely obst it   Specimen:  BAL rul/ endobronchial bx x 2 plus portion of mass suctioned off   Estimated Blood Loss: min  Complications: none  Indications and History: See updated H and P same date. The risks, benefits, complications, treatment options and expected outcomes were discussed with the patient.  The possibilities of reaction to medication, pulmonary aspiration, perforation of a viscus, bleeding, failure to diagnose a condition and creating a complication requiring transfusion or operation were discussed with the patient who freely signed the consent.    Description of Procedure: The patient was re-examined in the bronchoscopy suite and the site of surgery properly noted/marked.  The patient was identified  and the procedure verified as Flexible Fiberoptic Bronchoscopy.  A Time Out was held and the above information confirmed.   After the induction of topical nasopharyngeal anesthesia, the patient was positioned  and the bronchoscope was passed through the R naris. The vocal cords were visualized and  1% buffered lidocaine 5 ml was topically placed onto the cords. The cords were nl. The scope was then passed into the trachea.  1% buffered lidocaine given topically. Airways inspected bilaterally to the subsegmental level with the following findings:  Airways nl x for mass prolapsing out of RUL apical segment completely obst it    Interventions: BAL rul/ endobronchial bx x 2 RUl      The Patient was taken to the Endoscopy Recovery area in satisfactory condition.  Attestation: I performed  the procedure.  Christinia Gully, MD Pulmonary and [place] [phone] After 5:30 PM or weekends, call [phone]

## 2018-07-02 NOTE — Progress Notes (Signed)
Video bronchoscopy performed.  Intervention bronchial washing. Intervention bronchial biopsy.  No complications noted.  Will continue to monitor.

## 2018-07-03 ENCOUNTER — Encounter (HOSPITAL_COMMUNITY): Payer: Self-pay | Admitting: Internal Medicine

## 2018-07-03 ENCOUNTER — Other Ambulatory Visit: Payer: Self-pay

## 2018-07-03 DIAGNOSIS — R911 Solitary pulmonary nodule: Secondary | ICD-10-CM

## 2018-07-12 ENCOUNTER — Encounter (HOSPITAL_COMMUNITY)
Admission: RE | Admit: 2018-07-12 | Discharge: 2018-07-12 | Disposition: A | Discharge status: Home / Self Care | Payer: Medicare Other | Source: Ambulatory Visit | Attending: Internal Medicine | Admitting: Internal Medicine

## 2018-07-12 DIAGNOSIS — C781 Secondary malignant neoplasm of mediastinum: Secondary | ICD-10-CM | POA: Diagnosis not present

## 2018-07-12 DIAGNOSIS — R911 Solitary pulmonary nodule: Secondary | ICD-10-CM | POA: Insufficient documentation

## 2018-07-12 DIAGNOSIS — G936 Cerebral edema: Secondary | ICD-10-CM | POA: Diagnosis not present

## 2018-07-12 DIAGNOSIS — E440 Moderate protein-calorie malnutrition: Secondary | ICD-10-CM | POA: Diagnosis not present

## 2018-07-12 DIAGNOSIS — N179 Acute kidney failure, unspecified: Secondary | ICD-10-CM | POA: Diagnosis not present

## 2018-07-12 DIAGNOSIS — R5383 Other fatigue: Secondary | ICD-10-CM | POA: Diagnosis not present

## 2018-07-12 DIAGNOSIS — R918 Other nonspecific abnormal finding of lung field: Secondary | ICD-10-CM | POA: Diagnosis not present

## 2018-07-12 DIAGNOSIS — C3411 Malignant neoplasm of upper lobe, right bronchus or lung: Secondary | ICD-10-CM | POA: Diagnosis not present

## 2018-07-12 DIAGNOSIS — R569 Unspecified convulsions: Secondary | ICD-10-CM | POA: Diagnosis not present

## 2018-07-12 DIAGNOSIS — C7931 Secondary malignant neoplasm of brain: Secondary | ICD-10-CM | POA: Diagnosis not present

## 2018-07-12 LAB — GLUCOSE, CAPILLARY: Glucose-Capillary: 99 mg/dL (ref 70–99)

## 2018-07-12 MED ORDER — FLUDEOXYGLUCOSE F - 18 (FDG) INJECTION
6.6900 | Freq: Once | INTRAVENOUS | Status: AC | PRN
  Administered 2018-07-12: 6.69 via INTRAVENOUS

## 2018-07-13 ENCOUNTER — Emergency Department (HOSPITAL_COMMUNITY): Payer: Medicare Other

## 2018-07-13 ENCOUNTER — Inpatient Hospital Stay (HOSPITAL_COMMUNITY)
Admission: EM | Admit: 2018-07-13 | Discharge: 2018-07-16 | DRG: 180 | Disposition: A | Discharge status: Home Health | Payer: Medicare Other | Attending: Internal Medicine | Admitting: Internal Medicine

## 2018-07-13 ENCOUNTER — Other Ambulatory Visit: Payer: Self-pay

## 2018-07-13 ENCOUNTER — Encounter (HOSPITAL_COMMUNITY): Payer: Self-pay | Admitting: Emergency Medicine

## 2018-07-13 ENCOUNTER — Inpatient Hospital Stay (HOSPITAL_COMMUNITY): Payer: Medicare Other

## 2018-07-13 DIAGNOSIS — G40909 Epilepsy, unspecified, not intractable, without status epilepticus: Secondary | ICD-10-CM | POA: Diagnosis present

## 2018-07-13 DIAGNOSIS — E871 Hypo-osmolality and hyponatremia: Secondary | ICD-10-CM | POA: Diagnosis present

## 2018-07-13 DIAGNOSIS — E440 Moderate protein-calorie malnutrition: Secondary | ICD-10-CM | POA: Diagnosis present

## 2018-07-13 DIAGNOSIS — G936 Cerebral edema: Secondary | ICD-10-CM | POA: Diagnosis present

## 2018-07-13 DIAGNOSIS — Z800 Family history of malignant neoplasm of digestive organs: Secondary | ICD-10-CM | POA: Diagnosis not present

## 2018-07-13 DIAGNOSIS — R918 Other nonspecific abnormal finding of lung field: Secondary | ICD-10-CM | POA: Diagnosis not present

## 2018-07-13 DIAGNOSIS — R0902 Hypoxemia: Secondary | ICD-10-CM | POA: Diagnosis not present

## 2018-07-13 DIAGNOSIS — Z888 Allergy status to other drugs, medicaments and biological substances status: Secondary | ICD-10-CM

## 2018-07-13 DIAGNOSIS — N179 Acute kidney failure, unspecified: Secondary | ICD-10-CM | POA: Diagnosis present

## 2018-07-13 DIAGNOSIS — R55 Syncope and collapse: Secondary | ICD-10-CM | POA: Diagnosis not present

## 2018-07-13 DIAGNOSIS — C3490 Malignant neoplasm of unspecified part of unspecified bronchus or lung: Secondary | ICD-10-CM | POA: Diagnosis not present

## 2018-07-13 DIAGNOSIS — K219 Gastro-esophageal reflux disease without esophagitis: Secondary | ICD-10-CM | POA: Diagnosis present

## 2018-07-13 DIAGNOSIS — Z681 Body mass index (BMI) 19 or less, adult: Secondary | ICD-10-CM

## 2018-07-13 DIAGNOSIS — Z716 Tobacco abuse counseling: Secondary | ICD-10-CM

## 2018-07-13 DIAGNOSIS — R4020 Unspecified coma: Secondary | ICD-10-CM | POA: Diagnosis not present

## 2018-07-13 DIAGNOSIS — D638 Anemia in other chronic diseases classified elsewhere: Secondary | ICD-10-CM | POA: Diagnosis present

## 2018-07-13 DIAGNOSIS — Z8249 Family history of ischemic heart disease and other diseases of the circulatory system: Secondary | ICD-10-CM | POA: Diagnosis not present

## 2018-07-13 DIAGNOSIS — E860 Dehydration: Secondary | ICD-10-CM | POA: Diagnosis present

## 2018-07-13 DIAGNOSIS — Z8546 Personal history of malignant neoplasm of prostate: Secondary | ICD-10-CM | POA: Diagnosis not present

## 2018-07-13 DIAGNOSIS — I959 Hypotension, unspecified: Secondary | ICD-10-CM | POA: Diagnosis not present

## 2018-07-13 DIAGNOSIS — J449 Chronic obstructive pulmonary disease, unspecified: Secondary | ICD-10-CM | POA: Diagnosis present

## 2018-07-13 DIAGNOSIS — R590 Localized enlarged lymph nodes: Secondary | ICD-10-CM | POA: Diagnosis present

## 2018-07-13 DIAGNOSIS — J95811 Postprocedural pneumothorax: Secondary | ICD-10-CM

## 2018-07-13 DIAGNOSIS — R5383 Other fatigue: Secondary | ICD-10-CM | POA: Diagnosis not present

## 2018-07-13 DIAGNOSIS — Z79899 Other long term (current) drug therapy: Secondary | ICD-10-CM

## 2018-07-13 DIAGNOSIS — R569 Unspecified convulsions: Secondary | ICD-10-CM

## 2018-07-13 DIAGNOSIS — Z923 Personal history of irradiation: Secondary | ICD-10-CM

## 2018-07-13 DIAGNOSIS — C781 Secondary malignant neoplasm of mediastinum: Secondary | ICD-10-CM | POA: Diagnosis present

## 2018-07-13 DIAGNOSIS — I10 Essential (primary) hypertension: Secondary | ICD-10-CM | POA: Diagnosis present

## 2018-07-13 DIAGNOSIS — C3411 Malignant neoplasm of upper lobe, right bronchus or lung: Principal | ICD-10-CM | POA: Diagnosis present

## 2018-07-13 DIAGNOSIS — C7931 Secondary malignant neoplasm of brain: Secondary | ICD-10-CM | POA: Diagnosis present

## 2018-07-13 DIAGNOSIS — F17210 Nicotine dependence, cigarettes, uncomplicated: Secondary | ICD-10-CM | POA: Diagnosis present

## 2018-07-13 DIAGNOSIS — F419 Anxiety disorder, unspecified: Secondary | ICD-10-CM | POA: Diagnosis present

## 2018-07-13 LAB — CBC WITH DIFFERENTIAL/PLATELET
BASOS ABS: 0 10*3/uL (ref 0.0–0.1)
Basophils Relative: 0 %
EOS ABS: 0 10*3/uL (ref 0.0–0.7)
EOS PCT: 0 %
HCT: 36.6 % — ABNORMAL LOW (ref 39.0–52.0)
Hemoglobin: 12.9 g/dL — ABNORMAL LOW (ref 13.0–17.0)
LYMPHS ABS: 1 10*3/uL (ref 0.7–4.0)
LYMPHS PCT: 15 %
MCH: 31.8 pg (ref 26.0–34.0)
MCHC: 35.2 g/dL (ref 30.0–36.0)
MCV: 90.1 fL (ref 78.0–100.0)
Monocytes Absolute: 0.2 10*3/uL (ref 0.1–1.0)
Monocytes Relative: 3 %
NEUTROS PCT: 82 %
Neutro Abs: 5.4 10*3/uL (ref 1.7–7.7)
PLATELETS: 297 10*3/uL (ref 150–400)
RBC: 4.06 MIL/uL — AB (ref 4.22–5.81)
RDW: 13.3 % (ref 11.5–15.5)
WBC: 6.6 10*3/uL (ref 4.0–10.5)

## 2018-07-13 LAB — TROPONIN I: Troponin I: <0.03 ng/mL (ref <0.03)

## 2018-07-13 LAB — CBG MONITORING, ED: Glucose-Capillary: 141 mg/dL — ABNORMAL HIGH (ref 70–99)

## 2018-07-13 LAB — COMPREHENSIVE METABOLIC PANEL
ALK PHOS: 86 U/L (ref 38–126)
ALT: 7 U/L (ref 0–44)
AST: 21 U/L (ref 15–41)
Albumin: 3.3 g/dL — ABNORMAL LOW (ref 3.5–5.0)
Anion gap: 12 (ref 5–15)
BUN: 33 mg/dL — AB (ref 8–23)
CALCIUM: 8.7 mg/dL — AB (ref 8.9–10.3)
CHLORIDE: 95 mmol/L — AB (ref 98–111)
CO2: 22 mmol/L (ref 22–32)
CREATININE: 1.7 mg/dL — AB (ref 0.61–1.24)
GFR calc Af Amer: 41 mL/min — ABNORMAL LOW (ref >60)
GFR calc non Af Amer: 36 mL/min — ABNORMAL LOW (ref >60)
Glucose, Bld: 119 mg/dL — ABNORMAL HIGH (ref 70–99)
Potassium: 4.9 mmol/L (ref 3.5–5.1)
SODIUM: 129 mmol/L — AB (ref 135–145)
Total Bilirubin: 0.8 mg/dL (ref 0.3–1.2)
Total Protein: 7.1 g/dL (ref 6.5–8.1)

## 2018-07-13 LAB — OSMOLALITY: Osmolality: 289 mOsm/kg (ref 275–295)

## 2018-07-13 LAB — MRSA PCR SCREENING: MRSA BY PCR: NEGATIVE

## 2018-07-13 LAB — LACTIC ACID, PLASMA: Lactic Acid, Venous: 0.9 mmol/L (ref 0.5–1.9)

## 2018-07-13 LAB — CK: Total CK: 43 U/L — ABNORMAL LOW (ref 49–397)

## 2018-07-13 LAB — PSA: Prostatic Specific Antigen: 2.48 ng/mL (ref 0.00–4.00)

## 2018-07-13 MED ORDER — ADULT MULTIVITAMIN W/MINERALS CH
1.0000 | ORAL_TABLET | Freq: Every day | ORAL | Status: DC
  Administered 2018-07-13 – 2018-07-16 (×4): 1 via ORAL
  Filled 2018-07-13 (×5): qty 1

## 2018-07-13 MED ORDER — MAGNESIUM OXIDE 400 (241.3 MG) MG PO TABS
200.0000 mg | ORAL_TABLET | Freq: Every day | ORAL | Status: DC
  Administered 2018-07-13 – 2018-07-15 (×3): 200 mg via ORAL
  Administered 2018-07-16: 2 mg via ORAL
  Filled 2018-07-13 (×4): qty 1

## 2018-07-13 MED ORDER — DEXAMETHASONE SODIUM PHOSPHATE 10 MG/ML IJ SOLN
4.0000 mg | Freq: Four times a day (QID) | INTRAMUSCULAR | Status: DC
  Administered 2018-07-13 – 2018-07-16 (×12): 4 mg via INTRAVENOUS
  Filled 2018-07-13 (×3): qty 0.4
  Filled 2018-07-13 (×3): qty 1
  Filled 2018-07-13 (×4): qty 0.4
  Filled 2018-07-13: qty 1
  Filled 2018-07-13 (×3): qty 0.4

## 2018-07-13 MED ORDER — VITAMIN B-1 100 MG PO TABS
100.0000 mg | ORAL_TABLET | Freq: Every day | ORAL | Status: DC
  Administered 2018-07-13 – 2018-07-16 (×4): 100 mg via ORAL
  Filled 2018-07-13 (×4): qty 1

## 2018-07-13 MED ORDER — BENZONATATE 100 MG PO CAPS: 200.0000 mg | ORAL_CAPSULE | Freq: Three times a day (TID) | ORAL | Status: DC | PRN

## 2018-07-13 MED ORDER — ACETAMINOPHEN 650 MG RE SUPP: 650.0000 mg | Freq: Four times a day (QID) | RECTAL | Status: DC | PRN

## 2018-07-13 MED ORDER — DEXAMETHASONE SODIUM PHOSPHATE 10 MG/ML IJ SOLN
10.0000 mg | Freq: Once | INTRAMUSCULAR | Status: AC
  Administered 2018-07-13: 10 mg via INTRAVENOUS
  Filled 2018-07-13: qty 1

## 2018-07-13 MED ORDER — AMLODIPINE BESYLATE 5 MG PO TABS
5.0000 mg | ORAL_TABLET | Freq: Every day | ORAL | Status: DC
  Administered 2018-07-14 – 2018-07-16 (×3): 5 mg via ORAL
  Filled 2018-07-13 (×3): qty 1

## 2018-07-13 MED ORDER — GADOBENATE DIMEGLUMINE 529 MG/ML IV SOLN
15.0000 mL | Freq: Once | INTRAVENOUS | Status: AC | PRN
  Administered 2018-07-13: 13 mL via INTRAVENOUS

## 2018-07-13 MED ORDER — SODIUM CHLORIDE 0.9 % IV SOLN
750.0000 mg | Freq: Two times a day (BID) | INTRAVENOUS | Status: DC
  Administered 2018-07-13 – 2018-07-15 (×4): 750 mg via INTRAVENOUS
  Filled 2018-07-13 (×4): qty 7.5

## 2018-07-13 MED ORDER — SODIUM CHLORIDE 0.9 % IV SOLN
INTRAVENOUS | Status: DC
  Administered 2018-07-14 – 2018-07-15 (×3): via INTRAVENOUS

## 2018-07-13 MED ORDER — VITAMIN B-12 1000 MCG PO TABS
1000.0000 ug | ORAL_TABLET | Freq: Every day | ORAL | Status: DC
  Administered 2018-07-13 – 2018-07-16 (×4): 1000 ug via ORAL
  Filled 2018-07-13 (×4): qty 1

## 2018-07-13 MED ORDER — SODIUM CHLORIDE 0.9 % IV SOLN
INTRAVENOUS | Status: DC
  Administered 2018-07-13: 17:00:00 via INTRAVENOUS

## 2018-07-13 MED ORDER — LATANOPROST 0.005 % OP SOLN
1.0000 [drp] | Freq: Every day | OPHTHALMIC | Status: DC
  Administered 2018-07-13 – 2018-07-15 (×3): 1 [drp] via OPHTHALMIC
  Filled 2018-07-13: qty 2.5

## 2018-07-13 MED ORDER — MAGNESIUM 250 MG PO TABS
250.0000 mg | ORAL_TABLET | Freq: Every day | ORAL | Status: DC
  Filled 2018-07-13: qty 1

## 2018-07-13 MED ORDER — DEXAMETHASONE SODIUM PHOSPHATE 4 MG/ML IJ SOLN: 4.0000 mg | Freq: Four times a day (QID) | INTRAMUSCULAR | Status: DC

## 2018-07-13 MED ORDER — VITAMIN D3 25 MCG (1000 UNIT) PO TABS
1000.0000 [IU] | ORAL_TABLET | Freq: Every day | ORAL | Status: DC
  Administered 2018-07-13 – 2018-07-16 (×4): 1000 [IU] via ORAL
  Filled 2018-07-13 (×4): qty 1

## 2018-07-13 MED ORDER — ACETAMINOPHEN 325 MG PO TABS: 650.0000 mg | ORAL_TABLET | Freq: Four times a day (QID) | ORAL | Status: DC | PRN

## 2018-07-13 MED ORDER — LEVETIRACETAM IN NACL 1500 MG/100ML IV SOLN
1500.0000 mg | Freq: Once | INTRAVENOUS | Status: AC
  Administered 2018-07-13: 1500 mg via INTRAVENOUS
  Filled 2018-07-13: qty 100

## 2018-07-13 MED ORDER — FERROUS SULFATE 325 (65 FE) MG PO TABS
325.0000 mg | ORAL_TABLET | Freq: Every day | ORAL | Status: DC
  Administered 2018-07-14: 325 mg via ORAL
  Filled 2018-07-13: qty 1

## 2018-07-13 NOTE — ED Triage Notes (Signed)
Pt arrived via [facility]. Pt was sitting outside with wife had a >1 episode of seizure. 84/52 BP aox4

## 2018-07-13 NOTE — H&P (Signed)
History and Physical        Hospital Admission Note Date: 07/13/2018  Patient name: Wyatt Stein Medical record number: [id number] Date of birth: 1933/11/27 Age: 84 y.o. Gender: male  PCP: Wenda Low, MD    Patient coming from: home   I have reviewed all records in the [facility].    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  Per her wife at the bedside, they were eating brunch on the backyard patio around 11 AM when patient stated that he was feeling nauseated.  Subsequently he became less responsive, eyes rolled back, slumped up in a chair, drooling and started having upper body shaking for approximately 20 to 30 seconds.  Patient's wife called the EMS.  No recent history of illness, fever chills, chest pain, shortness of breath, diarrhea.  In ED patient was given loading dose of Keppra 1500 mg IV x1  ED work-up/course:  CT head showed 16x 18 mm hypodense mass right basal ganglia with extensive surrounding edema and 7 mm midline shift, most compatible with hemorrhagic metastatic disease Neurosurgery was consulted by Dr. Laverta Baltimore who discussed with Dr. Vertell Limber, recommended IV Decadron.  No plans of immediate surgical intervention.   Review of Systems: Positives marked in 'bold' Constitutional: Denies fever, chills, diaphoresis, poor appetite and fatigue.  HEENT: Denies  photophobia, eye pain, redness, hearing loss, ear pain, congestion, sore throat, rhinorrhea, sneezing, mouth sores, trouble swallowing, neck pain, neck stiffness and tinnitus.   Respiratory: Denies SOB, DOE, cough, chest tightness,  and wheezing.   Cardiovascular: Denies chest pain, palpitations and leg swelling.  Gastrointestinal: Denies nausea, vomiting, abdominal pain, diarrhea, constipation, blood in stool and abdominal distention.  Genitourinary: Denies dysuria, urgency, frequency, hematuria, flank pain and difficulty urinating.  Musculoskeletal: Denies myalgias, back pain, joint swelling, arthralgias and gait problem.  Skin: Denies pallor, rash and wound.  Neurological: See HPI  hematological: Denies adenopathy. Easy bruising, personal or family bleeding history  Psychiatric/Behavioral: Denies suicidal ideation, mood changes, confusion, nervousness, sleep disturbance and agitation  Past Medical History: Past Medical History:  Diagnosis Date  . Cancer [facility]) 2008   prostate, sees dr [name] once a year  . COPD (chronic obstructive pulmonary disease) ([place])   . GERD (gastroesophageal reflux disease)   . History of blood transfusion 2006  . Hypertension   . PONV (postoperative nausea and vomiting) 40 yrs ago   aspiration pneumonia after tooth extraxtion    Past Surgical History:  Procedure Laterality Date  . back fracture surgery  Lumbar 2006   screws and , back brace prn  . BACK SURGERY    . COLONOSCOPY WITH PROPOFOL N/A 07/01/2013   Procedure: COLONOSCOPY WITH PROPOFOL;  Surgeon: Garlan Fair, MD;  Location: [facility];  Service: Endoscopy;  Laterality:  N/A;  . ESOPHAGOGASTRODUODENOSCOPY (EGD) WITH PROPOFOL N/A 07/01/2013   Procedure: ESOPHAGOGASTRODUODENOSCOPY (EGD) WITH PROPOFOL;  Surgeon: Garlan Fair, MD;  Location: [facility];  Service: Endoscopy;  Laterality: N/A;  . INTRAMEDULLARY (IM) NAIL INTERTROCHANTERIC Left 08/16/2015   Procedure: INTRAMEDULLARY (IM) NAIL  INTERTROCHANTRIC;  Surgeon: Mcarthur Rossetti, MD;  Location: [facility];  Service: Orthopedics;  Laterality: Left;  . seed implant for prostate cancer    . TONSILLECTOMY    . VIDEO BRONCHOSCOPY Bilateral 07/02/2018   Procedure: VIDEO BRONCHOSCOPY WITH FLUORO;  Surgeon: Tanda Rockers, MD;  Location: [facility];  Service: Cardiopulmonary;  Laterality: Bilateral;    Medications: Prior to Admission medications   Medication Sig Start Date End Date Taking? Authorizing Provider  amLODipine (NORVASC) 5 MG tablet Take 5 mg by mouth daily. 01/18/18  Yes [provider]  benzonatate (TESSALON) 200 MG capsule Take 1 capsule by mouth 3 (three) times daily as needed for cough.  06/19/18  Yes [provider]  cholecalciferol (VITAMIN D) 1000 UNITS tablet Take 1,000 Units by mouth daily.   Yes [provider]  [employer] B-1 100 MG tablet Take 100 mg by mouth daily. 07/17/15  Yes [provider]  ferrous sulfate 325 (65 FE) MG tablet Take 325 mg by mouth daily with breakfast.   Yes [provider]  hydrochlorothiazide (HYDRODIURIL) 12.5 MG tablet Take 12.5 mg by mouth every morning. 01/17/18  Yes [provider]  latanoprost (XALATAN) 0.005 % ophthalmic solution Place 1 drop into both eyes at bedtime.  10/20/15  Yes [provider]  Magnesium 250 MG TABS Take 250 mg by mouth daily.    Yes [provider]  Multiple Vitamins-Minerals (CENTRUM SILVER ADULT 50+) TABS Take 1 tablet by mouth daily.   Yes [provider]  vitamin B-12 (CYANOCOBALAMIN) 1000 MCG tablet Take 1,000 mcg by mouth daily.   Yes [provider]    Allergies:   Allergies  Allergen Reactions  . Ace Inhibitors Anaphylaxis and Swelling  . Ramipril Anaphylaxis and Itching    Social History:  reports that he has been smoking cigarettes. He has a 30.00 pack-year smoking history. He has never used smokeless tobacco. He reports that he does not drink alcohol or use  drugs.  Family History: Family History  Problem Relation Age of Onset  . Heart Problems Mother   . Colon cancer Father     Physical Exam: Blood pressure 136/79, pulse 72, temperature 98.2 F (36.8 C), temperature source Oral, resp. rate (!) 22, height 5\' 8"  (1.727 m), weight 61.2 kg, SpO2 100 %. General: Alert, awake, oriented x3, in no acute distress. Eyes: pink conjunctiva,anicteric sclera, pupils equal and reactive to light and accomodation, HEENT: normocephalic, atraumatic, oropharynx clear Neck: supple, no masses or lymphadenopathy, no goiter, no bruits, no JVD CVS: Regular rate and rhythm, without murmurs, rubs or gallops. No lower extremity edema Resp : Clear to auscultation bilaterally, no wheezing, rales or rhonchi. GI : Soft, nontender, nondistended, positive bowel sounds, no masses. No hepatomegaly. No hernia.  Musculoskeletal: No clubbing or cyanosis, positive pedal pulses. No contracture. ROM intact  Neuro: Grossly intact, no focal neurological deficits, strength 5/5 upper and lower extremities bilaterally Psych: alert and oriented x 3, normal mood and affect Skin: no rashes or lesions, warm and dry   LABS on Admission: I have personally reviewed all the labs and imagings below    Basic Metabolic Panel: Recent Labs  Lab 07/13/18 1520  NA 129*  K 4.9  CL 95*  CO2 22  GLUCOSE 119*  BUN 33*  CREATININE 1.70*  CALCIUM 8.7*   Liver Function Tests: Recent Labs  Lab 07/13/18 1520  AST 21  ALT 7  ALKPHOS 86  BILITOT 0.8  PROT 7.1  ALBUMIN 3.3*   No results for input(s): LIPASE, AMYLASE in the last 168 hours. No results for input(s): AMMONIA in the last 168 hours. CBC: Recent Labs  Lab 07/13/18 1520  WBC 6.6  NEUTROABS 5.4  HGB 12.9*  HCT 36.6*  MCV 90.1  PLT 297   Cardiac Enzymes: Recent Labs  Lab 07/13/18 1520  TROPONINI <0.03   BNP: Invalid input(s): POCBNP CBG: Recent Labs  Lab 07/12/18 1417 07/13/18 1410  GLUCAP 99 141*     Radiological Exams on Admission:  Dg Chest 2 View  Result Date: 07/13/2018 CLINICAL DATA:  Seizure activity earlier today while sitting outside at home with his wife. 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 EXAM: CHEST - 2 VIEW COMPARISON:  PET-CT yesterday. CT chest 06/21/2018. Chest x-rays 06/20/2018 and earlier. FINDINGS: Cardiac silhouette normal in size, unchanged. Thoracic aorta mildly tortuous, unchanged. Large RIGHT UPPER LOBE lung mass extending into the RIGHT hilum as noted on the recent examinations. Lungs hyperinflated but clear otherwise. No pleural effusions. IMPRESSION: Large RIGHT UPPER LOBE lung mass extending into the RIGHT hilum as noted previously. No new abnormalities. Electronically Signed   By: Evangeline Dakin M.D.   On: 07/13/2018 16:12   Ct Head Wo Contrast  Result Date: 07/13/2018 CLINICAL DATA:  New onset seizure EXAM: CT HEAD WITHOUT CONTRAST TECHNIQUE: Contiguous axial images were obtained from the base of the skull through the vertex without intravenous contrast. COMPARISON:  MRI head 09/05/2013, 03/25/2012 FINDINGS: Brain: Hyperdense mass in the right basal ganglia measures 16 x 18 mm with extensive surrounding edema. Findings most compatible with hemorrhagic metastasis with vasogenic edema. 7 mm of midline shift. No other mass lesions are seen on unenhanced imaging. Moderate atrophy and moderate chronic microvascular ischemic change in the white matter. Vascular: Negative for hyperdense vessel Skull: Negative Sinuses/Orbits: Negative Other: None IMPRESSION: 16 x 18 mm hyperdense mass right basal ganglia with extensive surrounding edema and 7 mm midline shift. Findings most compatible with hemorrhagic metastatic disease. Patient has a large lung mass which is hypermetabolic on PET scan yesterday compatible with lung carcinoma. Moderate atrophy and moderate chronic microvascular ischemia These results were called by telephone at the time of  interpretation on 07/13/2018 at 4:03 pm to Dr. Nanda Quinton , who verbally acknowledged these results. Electronically Signed   By: Franchot Gallo M.D.   On: 07/13/2018 16:04   Nm Pet Image Initial (pi) Skull Base To Thigh  Result Date: 07/13/2018 CLINICAL DATA:  Initial treatment strategy for lung mass. EXAM: NUCLEAR MEDICINE PET SKULL BASE TO THIGH TECHNIQUE: 6.7 mCi F-18 FDG was injected intravenously. Full-ring PET imaging was performed from the skull base to thigh after the radiotracer. CT data was obtained and used for attenuation correction and anatomic localization. Fasting blood glucose: 99 mg/dl COMPARISON:  Chest CT 06/21/2018 FINDINGS: Mediastinal blood pool activity: SUV max 2.2 NECK: High-density lesion in the medial RIGHT frontal lobe has hypermetabolic activity above background white matter consistent with brain metastasis. No hypermetabolic lymph nodes in the neck. Incidental CT findings: none CHEST: Large RIGHT upper lobe mass measuring 8 cm is intensely hypermetabolic with SUV max 18. Portions of hypermetabolic central necrosis in the posterolateral aspect of mass. Enlarged hypermetabolic RIGHT lower paratracheal  lymph node. No hypermetabolic supraclavicular nodes Incidental CT findings: Coronary artery calcification and aortic atherosclerotic calcification. ABDOMEN/PELVIS: No abnormal hypermetabolic activity within the liver, pancreas, adrenal glands, or spleen. No hypermetabolic lymph nodes in the abdomen or pelvis. Incidental CT findings: Brachytherapy seeds the prostate gland SKELETON: No focal hypermetabolic activity to suggest skeletal metastasis. Incidental CT findings: Posterior lumbar fusion IMPRESSION: 1. Hypermetabolic RIGHT upper lobe mass consists primary bronchogenic carcinoma. 2. Ipsilateral hypermetabolic mediastinal nodal metastasis. 3. Metastatic brain lesion in the RIGHT frontal lobe partially imaged. See comparison head CT from 07/13/2018 4. No supraclavicular adenopathy. 5.  No evidence of metastatic disease below the diaphragms. Electronically Signed   By: Suzy Bouchard M.D.   On: 07/13/2018 16:14      EKG: Independently reviewed.  Rate 73, normal sinus rhythm   Assessment/Plan Principal Problem: Seizure; with underlying history of seizures and brain metastasis, right upper lobe lung mass -Currently alert and oriented, will admit to stepdown unit, seizure precautions -Patient was given loading dose of Keppra 1500 mg IV x1 with Decadron 10 mg IV x1 -Neurosurgery was consulted, per Dr. Vertell Limber, no immediate surgical interventions -Patient had prior history of seizures, followed by Dr. Delice Lesch, had tapered off [place].  Discussed  with neurology, Dr. Lorraine Lax, recommended Keppra 750 mg IV twice daily.  No need for EEG as cause known further seizures due to metastatic brain mass.  Recommended discharge on the same dose and follow-up with Dr Delice Lesch -Placed on Decadron 4 mg IV every 6 hours -Consulted oncology, discussed in detail with Dr. Alvy Bimler, recommended MRI brain.  She discussed with radiation oncology, Dr. Lisbeth Renshaw for further evaluation. -Patient has prior history of prostatic cancer, check PSA -Patient recent endobronchial biopsy showed necrotic tumor but no further specification. Discussed with Dr. Dellie Catholic (pulmonology), who reviewed the scans and recommended possible EBUS, pulmonology will follow in am with further recommendations    Active Problems:   Anemia of chronic disease: Likely due to malignancy -Baseline hemoglobin 8-9, currently 12.9, follow closely  Acute kidney injury -Likely due to seizures -Hold HCTZ, placed on IV fluid hydration -Check lactic acid, CK    Malnutrition of moderate degree -Nutrition consult  Essential hypertension -Hold HCTZ, continue amlodipine  Mild hyponatremia -Sodium 129, likely due to seizures, dehydration, HCTZ, possible SIADH due to brain mets -Obtain serum osmolarity, urine osmolarity, UNa -Placed on IV  fluid hydration, recheck in a.m.   DVT prophylaxis: SCDs (hemorrhagic metastasis on CT head)  CODE STATUS: Discussed in detail with the patient and wife at the bedside, patient opted for full CODE STATUS  Consults called: Neurosurgery, neurology, pulmonology, oncology, rad onc  Family Communication: Admission, patients condition and plan of care including tests being ordered have been discussed with the patient and wife who indicates understanding and agree with the plan and Code Status  Admission status: Inpatient stepdown  Disposition plan: Further plan will depend as patient's clinical course evolves and further radiologic and laboratory data become available.    At the time of admission, it appears that the appropriate admission status for this patient is INPATIENT . This is judged to be reasonable and necessary in order to provide the required intensity of service to ensure the patient's safety given the presenting symptoms seizures with brain mets, physical exam findings, and initial radiographic and laboratory data in the context of their chronic comorbidities.  The medical decision making on this patient was of high complexity and the patient is at high risk for clinical deterioration, therefore this is a  level 3 visit.  Time Spent on Admission: 19mins    Yves Fodor M.D. [facility] 07/13/2018, 5:11 PM Pager: [phone]  If 7PM-7AM, please contact night-coverage [email or web address] Password [email or web address]

## 2018-07-13 NOTE — ED Provider Notes (Signed)
Emergency Department Provider Note   I have reviewed the triage vital signs and the nursing notes.   HISTORY  Chief Complaint Loss of Consciousness and Seizures   HPI Wyatt Stein is a 84 y.o. male with PMH of COPD, HTN, and GERD presents to the emergency department for evaluation of possible seizure event today.  The patient's wife states that he woke up this morning and was feeling fatigued.  She made a large breakfast and after eating he was reading the paper and suddenly stated he felt very nauseated.  She states that he became less responsive, drooling, and began having upper body tremors/shaking.  She states it did not seem similar to his prior seizures and lasted approximately 15 seconds.  She called paramedics.  The patient has been on Keppra 750 mg QHS but discontinued this in the last year.  He states his last seizure has been over a year ago.  He does follow with local neurology and has an appointment next week.  He denies any fevers or chills.  No head trauma.  Patient's wife states that he remained seated in the chair during the entire episode.   Past Medical History:  Diagnosis Date  . Cancer [facility]) 2008   prostate, sees dr [name] once a year  . COPD (chronic obstructive pulmonary disease) ([place])   . GERD (gastroesophageal reflux disease)   . History of blood transfusion 2006  . Hypertension   . PONV (postoperative nausea and vomiting) 40 yrs ago   aspiration pneumonia after tooth extraxtion    Patient Active Problem List   Diagnosis Date Noted  . Brain metastases ([place]) 07/13/2018  . Cigarette smoker 06/27/2018  . Lung mass 06/26/2018  . Memory changes 08/08/2017  . Localization-related idiopathic epilepsy and epileptic syndromes with seizures of localized onset, not intractable, without status epilepticus ([place]) 11/01/2015  . Edema 09/05/2015  . COPD GOLD 0/ still smoking 08/27/2015  . Postoperative anemia due to acute blood loss 08/27/2015  . Vitamin D  deficiency 08/27/2015  . Malnutrition of moderate degree 08/20/2015  . Convulsions ([place])   . Syncope and collapse 08/15/2015  . Anemia of chronic disease 08/15/2015  . Hypokalemia 08/15/2015  . Dehydration 08/15/2015  . Diarrhea 08/15/2015  . QT prolongation 08/15/2015  . Alcohol abuse 08/15/2015  . Anxiety 08/15/2015  . Syncope 08/15/2015  . Closed left hip fracture ([place])   . Seizure [facility])     Past Surgical History:  Procedure Laterality Date  . back fracture surgery  Lumbar 2006   screws and , back brace prn  . BACK SURGERY    . COLONOSCOPY WITH PROPOFOL N/A 07/01/2013   Procedure: COLONOSCOPY WITH PROPOFOL;  Surgeon: Garlan Fair, MD;  Location: [facility] ENDOSCOPY;  Service: Endoscopy;  Laterality: N/A;  . ESOPHAGOGASTRODUODENOSCOPY (EGD) WITH PROPOFOL N/A 07/01/2013   Procedure: ESOPHAGOGASTRODUODENOSCOPY (EGD) WITH PROPOFOL;  Surgeon: Garlan Fair, MD;  Location: [facility] ENDOSCOPY;  Service: Endoscopy;  Laterality: N/A;  . INTRAMEDULLARY (IM) NAIL INTERTROCHANTERIC Left 08/16/2015   Procedure: INTRAMEDULLARY (IM) NAIL INTERTROCHANTRIC;  Surgeon: Mcarthur Rossetti, MD;  Location: [facility] ORS;  Service: Orthopedics;  Laterality: Left;  . seed implant for prostate cancer    . TONSILLECTOMY    . VIDEO BRONCHOSCOPY Bilateral 07/02/2018   Procedure: VIDEO BRONCHOSCOPY WITH FLUORO;  Surgeon: Tanda Rockers, MD;  Location: [facility] ENDOSCOPY;  Service: Cardiopulmonary;  Laterality: Bilateral;    Allergies Ace inhibitors and Ramipril  Family History  Problem Relation Age of Onset  .  Heart Problems Mother   . Colon cancer Father     Social History Social History   Tobacco Use  . Smoking status: Current Every Day Smoker    Packs/day: 0.50    Years: 60.00    Pack years: 30.00    Types: Cigarettes  . Smokeless tobacco: Never Used  Substance Use Topics  . Alcohol use: No    Alcohol/week: 14.0 standard drinks    Types: 14 Shots of liquor per week    Comment: Former--pt states none  since Oct  . Drug use: No    Review of Systems  Constitutional: No fever/chills Eyes: No visual changes. ENT: No sore throat. Cardiovascular: Denies chest pain. Respiratory: Denies shortness of breath. Gastrointestinal: No abdominal pain.  No nausea, no vomiting.  No diarrhea.  No constipation. Genitourinary: Negative for dysuria. Musculoskeletal: Negative for back pain. Skin: Negative for rash. Neurological: Negative for headaches, focal weakness or numbness. Questionable seizure activity.   10-point ROS otherwise negative.  ____________________________________________   PHYSICAL EXAM:  VITAL SIGNS: ED Triage Vitals  Enc Vitals Group     BP 07/13/18 1400 128/77     Pulse Rate 07/13/18 1400 72     Resp 07/13/18 1401 16     Temp 07/13/18 1401 98.2 F (36.8 C)     Temp Source 07/13/18 1401 Oral     SpO2 07/13/18 1355 98 %     Weight 07/13/18 1402 135 lb (61.2 kg)     Height 07/13/18 1402 5\' 8"  (1.727 m)     Pain Score 07/13/18 1401 0    Constitutional: Alert and oriented. Well appearing and in no acute distress. Eyes: Conjunctivae are normal. PERRL. EOMI. Head: Atraumatic. Nose: No congestion/rhinnorhea. Mouth/Throat: Mucous membranes are moist.  Oropharynx non-erythematous. Neck: No stridor.   Cardiovascular: Normal rate, regular rhythm. Good peripheral circulation. Grossly normal heart sounds.   Respiratory: Normal respiratory effort.  No retractions. Lungs CTAB. Gastrointestinal: Soft and nontender. No distention.  Musculoskeletal: No lower extremity tenderness nor edema. No gross deformities of extremities. Neurologic:  Normal speech and language. No gross focal neurologic deficits are appreciated.  Skin:  Skin is warm, dry and intact. No rash noted.  ____________________________________________   LABS (all labs ordered are listed, but only abnormal results are displayed)  Labs Reviewed  CBC WITH DIFFERENTIAL/PLATELET - Abnormal; Notable for the following  components:      Result Value   RBC 4.06 (*)    Hemoglobin 12.9 (*)    HCT 36.6 (*)    All other components within normal limits  COMPREHENSIVE METABOLIC PANEL - Abnormal; Notable for the following components:   Sodium 129 (*)    Chloride 95 (*)    Glucose, Bld 119 (*)    BUN 33 (*)    Creatinine, Ser 1.70 (*)    Calcium 8.7 (*)    Albumin 3.3 (*)    GFR calc non Af Amer 36 (*)    GFR calc Af Amer 41 (*)    All other components within normal limits  CK - Abnormal; Notable for the following components:   Total CK 43 (*)    All other components within normal limits  CBG MONITORING, ED - Abnormal; Notable for the following components:   Glucose-Capillary 141 (*)    All other components within normal limits  MRSA PCR SCREENING  URINE CULTURE  TROPONIN I  LACTIC ACID, PLASMA  OSMOLALITY  PSA  SODIUM, URINE, RANDOM  OSMOLALITY, URINE  BASIC METABOLIC PANEL  CBC   ____________________________________________  EKG   EKG Interpretation  Date/Time:  Saturday July 13 2018 14:16:16 EDT Ventricular Rate:  73 PR Interval:    QRS Duration: 89 QT Interval:  376 QTC Calculation: 415 R Axis:   32 Text Interpretation:  Sinus rhythm Anteroseptal infarct, old No STEMI.  Confirmed by Nanda Quinton [phone]) on 07/13/2018 3:48:32 PM       ____________________________________________  RADIOLOGY  Dg Chest 2 View  Result Date: 07/13/2018 CLINICAL DATA:  Seizure activity earlier today while sitting outside at home with his wife. 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 EXAM: CHEST - 2 VIEW COMPARISON:  PET-CT yesterday. CT chest 06/21/2018. Chest x-rays 06/20/2018 and earlier. FINDINGS: Cardiac silhouette normal in size, unchanged. Thoracic aorta mildly tortuous, unchanged. Large RIGHT UPPER LOBE lung mass extending into the RIGHT hilum as noted on the recent examinations. Lungs hyperinflated but clear otherwise. No pleural effusions. IMPRESSION: Large RIGHT UPPER  LOBE lung mass extending into the RIGHT hilum as noted previously. No new abnormalities. Electronically Signed   By: Evangeline Dakin M.D.   On: 07/13/2018 16:12   Ct Head Wo Contrast  Result Date: 07/13/2018 CLINICAL DATA:  New onset seizure EXAM: CT HEAD WITHOUT CONTRAST TECHNIQUE: Contiguous axial images were obtained from the base of the skull through the vertex without intravenous contrast. COMPARISON:  MRI head 09/05/2013, 03/25/2012 FINDINGS: Brain: Hyperdense mass in the right basal ganglia measures 16 x 18 mm with extensive surrounding edema. Findings most compatible with hemorrhagic metastasis with vasogenic edema. 7 mm of midline shift. No other mass lesions are seen on unenhanced imaging. Moderate atrophy and moderate chronic microvascular ischemic change in the white matter. Vascular: Negative for hyperdense vessel Skull: Negative Sinuses/Orbits: Negative Other: None IMPRESSION: 16 x 18 mm hyperdense mass right basal ganglia with extensive surrounding edema and 7 mm midline shift. Findings most compatible with hemorrhagic metastatic disease. Patient has a large lung mass which is hypermetabolic on PET scan yesterday compatible with lung carcinoma. Moderate atrophy and moderate chronic microvascular ischemia These results were called by telephone at the time of interpretation on 07/13/2018 at 4:03 pm to Dr. Nanda Quinton , who verbally acknowledged these results. Electronically Signed   By: Franchot Gallo M.D.   On: 07/13/2018 16:04   Mr Jeri Cos OZ Contrast  Result Date: 07/13/2018 CLINICAL DATA:  Metastatic lung cancer staging EXAM: MRI HEAD WITHOUT AND WITH CONTRAST TECHNIQUE: Multiplanar, multiecho pulse sequences of the brain and surrounding structures were obtained without and with intravenous contrast. CONTRAST:  80mL MULTIHANCE GADOBENATE DIMEGLUMINE 529 MG/ML IV SOLN COMPARISON:  CT head 07/13/2018 FINDINGS: Brain: Enhancing mass lesion in the right basal ganglia measures 22 x 17 x 21 mm.  The mass has irregular enhancement with irregular contour to the lesion. Large amount of surrounding edema. Lesion is hyperdense on CT suggesting hemorrhage however MRI does not confirm hemorrhage in the lesion. 2 mm enhancing nodule in the right frontal lobe within the white matter edema. This is consistent with a second brain metastasis. No other enhancing nodules identified. Extensive edema surrounding the right basal ganglia lesion with mass-effect on the lateral ventricle. 7 mm midline shift to the left. Generalized atrophy. Chronic microvascular ischemic changes in the white matter. Negative for acute infarct. Vascular: Normal arterial flow voids Skull and upper cervical spine: Negative Sinuses/Orbits: Negative Other: None IMPRESSION: Enhancing mass lesion right basal ganglia with extensive surrounding edema. Given history of lung cancer this is consistent with metastatic disease. Second 2  mm enhancing lesion in the right frontal lobe also consistent with metastatic disease. There is mass-effect and 7 mm midline shift to the left due to extensive edema. Electronically Signed   By: Franchot Gallo M.D.   On: 07/13/2018 20:18    ____________________________________________   PROCEDURES  Procedure(s) performed:   .Critical Care Performed by: Margette Fast, MD Authorized by: Margette Fast, MD   Critical care provider statement:    Critical care time (minutes):  35   Critical care time was exclusive of:  Separately billable procedures and treating other patients and teaching time   Critical care was necessary to treat or prevent imminent or life-threatening deterioration of the following conditions:  CNS failure or compromise   Critical care was time spent personally by me on the following activities:  Blood draw for specimens, development of treatment plan with patient or surrogate, discussions with consultants, evaluation of patient's response to treatment, examination of patient, obtaining  history from patient or surrogate, ordering and performing treatments and interventions, ordering and review of laboratory studies, ordering and review of radiographic studies, pulse oximetry, re-evaluation of patient's condition and review of old charts   I assumed direction of critical care for this patient from another provider in my specialty: no     ____________________________________________   INITIAL IMPRESSION / [place] / ED COURSE  Pertinent labs & imaging results that were available during my care of the patient were reviewed by me and considered in my medical decision making (see chart for details).  Patient with past history of seizure disorder not currently on antiepileptic drugs presents with possible breakthrough seizure.  He states his last seizure was approximately 1 year prior and has been off of Keppra 750 mg nightly medication over that time.  No head trauma.  No stigmata of seizure.  Patient is awake and alert with no neuro deficits.  He is undergoing evaluation for recent lung mass and recently had PET scan.  Unclear if this is a breakthrough seizure versus syncope.  EKG reviewed with no acute findings.  Labs are pending.  Given his lung nodule plan for CT imaging of the head and chest x-ray.  04:38 PM Spoke with Dr. Vertell Limber with Neurosurgery regarding the CT findings. Agree with Decadron and Keppra. Can obs overnight with medicine and ultimately will need referral to Neuro-oncology. Patient will not require surgery so ok to stay at [facility].   Discussed patient's case with Hospitalist to request admission. Patient and family (if present) updated with plan. Care transferred to Hospitalist service.  I reviewed all nursing notes, vitals, pertinent old records, EKGs, labs, imaging (as available).  ____________________________________________  FINAL CLINICAL IMPRESSION(S) / ED DIAGNOSES  Final diagnoses:  Brain metastasis ([place])  Seizure-like activity ([place])       MEDICATIONS GIVEN DURING THIS VISIT:  Medications  ferrous sulfate tablet 325 mg (has no administration in time range)  vitamin B-12 (CYANOCOBALAMIN) tablet 1,000 mcg (1,000 mcg Oral Given 07/13/18 2000)  cholecalciferol (VITAMIN D) tablet 1,000 Units (1,000 Units Oral Given 07/13/18 2000)  thiamine (VITAMIN B-1) tablet 100 mg (100 mg Oral Given 07/13/18 2000)  multivitamin with minerals tablet 1 tablet (1 tablet Oral Given 07/13/18 2000)  benzonatate (TESSALON) capsule 200 mg (has no administration in time range)  latanoprost (XALATAN) 0.005 % ophthalmic solution 1 drop (1 drop Both Eyes Given 07/13/18 2227)  0.9 %  sodium chloride infusion ( Intravenous Restarted 07/13/18 1930)  acetaminophen (TYLENOL) tablet 650 mg (has no  administration in time range)    Or  acetaminophen (TYLENOL) suppository 650 mg (has no administration in time range)  levETIRAcetam (KEPPRA) 750 mg in sodium chloride 0.9 % 100 mL IVPB (0 mg Intravenous Stopped 07/13/18 2015)  dexamethasone (DECADRON) injection 4 mg (4 mg Intravenous Given 07/13/18 2000)  0.9 %  sodium chloride infusion ( Intravenous Stopped 07/13/18 1845)  amLODipine (NORVASC) tablet 5 mg (has no administration in time range)  magnesium oxide (MAG-OX) tablet 200 mg (200 mg Oral Given 07/13/18 2000)  dexamethasone (DECADRON) injection 10 mg (10 mg Intravenous Given 07/13/18 1630)  levETIRAcetam (KEPPRA) IVPB 1500 mg/ 100 mL premix (0 mg Intravenous Stopped 07/13/18 1649)  gadobenate dimeglumine (MULTIHANCE) injection 15 mL (13 mLs Intravenous Contrast Given 07/13/18 1958)    Note:  This document was prepared using Dragon voice recognition software and may include unintentional dictation errors.  Nanda Quinton, MD Emergency Medicine    Karren Newland, [facility], MD 07/14/18 Dyann Kief

## 2018-07-13 NOTE — ED Notes (Signed)
Blood draw x 2 unsuccessful

## 2018-07-13 NOTE — Progress Notes (Signed)
Spoke with Hospitalist.  I have reviewed his chart briefly; biopsy from 07/02/18 was non-diagnostic for lung cancer although PET scan is highly suspicious. Noted history of prostate cancer. Recommendations 1) MRI brain: I have ordered 2) IV dex 4 mg q 6 hours: I have ordered 3) Radiation Oncology consultation: I have spoken with Dr. Lisbeth Renshaw 4) PSA tomorrow: discussed with hospitalist 5) Assistance from Neurologist for seizure management 6) Consult pulmonologist for repeat biopsy versus Ct guided biopsy  I will follow tomorrow once he is fully admitted

## 2018-07-13 NOTE — ED Notes (Signed)
ED TO INPATIENT HANDOFF REPORT  Name/Age/Gender Wyatt Stein 84 y.o. male  Code Status Code Status History    Date Active Date Inactive Code Status Order ID Comments User Context   08/16/2015 2219 08/20/2015 2132 Full Code [id number]  Mcarthur Rossetti, MD Inpatient   08/15/2015 2303 08/16/2015 2219 Full Code [id number]  Toy Baker, MD Inpatient      Home/SNF/Other Home  Chief Complaint syncope; seizure  Level of Care/Admitting Diagnosis ED Disposition    ED Disposition Condition [facility] Area: [facility] [[id number]]  Level of Care: Stepdown [14]  Admit to SDU based on following criteria: Hemodynamic compromise or significant risk of instability:  Patient requiring short term acute titration and management of vasoactive drips, and invasive monitoring (i.e., CVP and Arterial line).  Diagnosis: Seizure ([place]) [932355]  Admitting Physician: RAI, [place]  Attending Physician: RAI, RIPUDEEP K [[id number]]  Estimated length of stay: past midnight tomorrow  Certification:: I certify this patient will need inpatient services for at least 2 midnights  PT Class (Do Not Modify): Inpatient [101]  PT Acc Code (Do Not Modify): Private [1]       Medical History Past Medical History:  Diagnosis Date  . [facility]) 2008   prostate, sees dr [name] once a year  . COPD (chronic obstructive pulmonary disease) ([place])   . GERD (gastroesophageal reflux disease)   . History of blood transfusion 2006  . Hypertension   . PONV (postoperative nausea and vomiting) 40 yrs ago   aspiration pneumonia after tooth extraxtion    Allergies Allergies  Allergen Reactions  . Ace Inhibitors Anaphylaxis and Swelling  . Ramipril Anaphylaxis and Itching    IV Location/Drains/Wounds Patient Lines/Drains/Airways Status   Active Line/Drains/Airways    Name:   Placement date:   Placement time:   Site:   Days:   Peripheral IV 07/28/14 Left Forearm    07/28/14    -    Forearm   1446   Peripheral IV 07/13/18 Left Forearm   07/13/18    -    Forearm   less than 1   Incision (Closed) 08/16/15 Hip Left   08/16/15    2045     1062          Labs/Imaging Results for orders placed or performed during the hospital encounter of 07/13/18 (from the past 48 hour(s))  CBG monitoring, ED     Status: Abnormal   Collection Time: 07/13/18  2:10 PM  Result Value Ref Range   Glucose-Capillary 141 (H) 70 - 99 mg/dL   Comment 1 Notify RN    Comment 2 Document in Chart   CBC with Differential/Platelet     Status: Abnormal   Collection Time: 07/13/18  3:20 PM  Result Value Ref Range   WBC 6.6 4.0 - 10.5 K/uL   RBC 4.06 (L) 4.22 - 5.81 MIL/uL   Hemoglobin 12.9 (L) 13.0 - 17.0 g/dL   HCT 36.6 (L) 39.0 - 52.0 %   MCV 90.1 78.0 - 100.0 fL   MCH 31.8 26.0 - 34.0 pg   MCHC 35.2 30.0 - 36.0 g/dL   RDW 13.3 11.5 - 15.5 %   Platelets 297 150 - 400 K/uL   Neutrophils Relative % 82 %   Neutro Abs 5.4 1.7 - 7.7 K/uL   Lymphocytes Relative 15 %   Lymphs Abs 1.0 0.7 - 4.0 K/uL   Monocytes Relative 3 %   Monocytes Absolute 0.2  0.1 - 1.0 K/uL   Eosinophils Relative 0 %   Eosinophils Absolute 0.0 0.0 - 0.7 K/uL   Basophils Relative 0 %   Basophils Absolute 0.0 0.0 - 0.1 K/uL    Comment: Performed at [facility], [place] [address], [place], [place] [address]  Comprehensive metabolic panel     Status: Abnormal   Collection Time: 07/13/18  3:20 PM  Result Value Ref Range   Sodium 129 (L) 135 - 145 mmol/L   Potassium 4.9 3.5 - 5.1 mmol/L   Chloride 95 (L) 98 - 111 mmol/L   CO2 22 22 - 32 mmol/L   Glucose, Bld 119 (H) 70 - 99 mg/dL   BUN 33 (H) 8 - 23 mg/dL   Creatinine, Ser 1.70 (H) 0.61 - 1.24 mg/dL   Calcium 8.7 (L) 8.9 - 10.3 mg/dL   Total Protein 7.1 6.5 - 8.1 g/dL   Albumin 3.3 (L) 3.5 - 5.0 g/dL   AST 21 15 - 41 U/L   ALT 7 0 - 44 U/L   Alkaline Phosphatase 86 38 - 126 U/L   Total Bilirubin 0.8 0.3 - 1.2 mg/dL   GFR calc non Af  Amer 36 (L) >60 mL/min   GFR calc Af Amer 41 (L) >60 mL/min    Comment: (NOTE) The eGFR has been calculated using the CKD EPI equation. This calculation has not been validated in all clinical situations. eGFR's persistently <60 mL/min signify possible Chronic Kidney Disease.    Anion gap 12 5 - 15    Comment: Performed at [facility], [place] [address], [place], [place] [address]  Troponin I     Status: None   Collection Time: 07/13/18  3:20 PM  Result Value Ref Range   Troponin I <0.03 <0.03 ng/mL    Comment: Performed at [facility], [place] [address], [place], [place] [address]   Dg Chest 2 View  Result Date: 07/13/2018 CLINICAL DATA:  Seizure activity earlier today while sitting outside at home with his wife. 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 EXAM: CHEST - 2 VIEW COMPARISON:  PET-CT yesterday. CT chest 06/21/2018. Chest x-rays 06/20/2018 and earlier. FINDINGS: Cardiac silhouette normal in size, unchanged. Thoracic aorta mildly tortuous, unchanged. Large RIGHT UPPER LOBE lung mass extending into the RIGHT hilum as noted on the recent examinations. Lungs hyperinflated but clear otherwise. No pleural effusions. IMPRESSION: Large RIGHT UPPER LOBE lung mass extending into the RIGHT hilum as noted previously. No new abnormalities. Electronically Signed   By: Evangeline Dakin M.D.   On: 07/13/2018 16:12   Ct Head Wo Contrast  Result Date: 07/13/2018 CLINICAL DATA:  New onset seizure EXAM: CT HEAD WITHOUT CONTRAST TECHNIQUE: Contiguous axial images were obtained from the base of the skull through the vertex without intravenous contrast. COMPARISON:  MRI head 09/05/2013, 03/25/2012 FINDINGS: Brain: Hyperdense mass in the right basal ganglia measures 16 x 18 mm with extensive surrounding edema. Findings most compatible with hemorrhagic metastasis with vasogenic edema. 7 mm of midline shift. No other mass lesions are seen on unenhanced  imaging. Moderate atrophy and moderate chronic microvascular ischemic change in the white matter. Vascular: Negative for hyperdense vessel Skull: Negative Sinuses/Orbits: Negative Other: None IMPRESSION: 16 x 18 mm hyperdense mass right basal ganglia with extensive surrounding edema and 7 mm midline shift. Findings most compatible with hemorrhagic metastatic disease. Patient has a large lung mass which is hypermetabolic on PET scan yesterday compatible with lung carcinoma. Moderate  atrophy and moderate chronic microvascular ischemia These results were called by telephone at the time of interpretation on 07/13/2018 at 4:03 pm to Dr. Nanda Quinton , who verbally acknowledged these results. Electronically Signed   By: Franchot Gallo M.D.   On: 07/13/2018 16:04   Nm Pet Image Initial (pi) Skull Base To Thigh  Result Date: 07/13/2018 CLINICAL DATA:  Initial treatment strategy for lung mass. EXAM: NUCLEAR MEDICINE PET SKULL BASE TO THIGH TECHNIQUE: 6.7 mCi F-18 FDG was injected intravenously. Full-ring PET imaging was performed from the skull base to thigh after the radiotracer. CT data was obtained and used for attenuation correction and anatomic localization. Fasting blood glucose: 99 mg/dl COMPARISON:  Chest CT 06/21/2018 FINDINGS: Mediastinal blood pool activity: SUV max 2.2 NECK: High-density lesion in the medial RIGHT frontal lobe has hypermetabolic activity above background white matter consistent with brain metastasis. No hypermetabolic lymph nodes in the neck. Incidental CT findings: none CHEST: Large RIGHT upper lobe mass measuring 8 cm is intensely hypermetabolic with SUV max 18. Portions of hypermetabolic central necrosis in the posterolateral aspect of mass. Enlarged hypermetabolic RIGHT lower paratracheal lymph node. No hypermetabolic supraclavicular nodes Incidental CT findings: Coronary artery calcification and aortic atherosclerotic calcification. ABDOMEN/PELVIS: No abnormal hypermetabolic activity  within the liver, pancreas, adrenal glands, or spleen. No hypermetabolic lymph nodes in the abdomen or pelvis. Incidental CT findings: Brachytherapy seeds the prostate gland SKELETON: No focal hypermetabolic activity to suggest skeletal metastasis. Incidental CT findings: Posterior lumbar fusion IMPRESSION: 1. Hypermetabolic RIGHT upper lobe mass consists primary bronchogenic carcinoma. 2. Ipsilateral hypermetabolic mediastinal nodal metastasis. 3. Metastatic brain lesion in the RIGHT frontal lobe partially imaged. See comparison head CT from 07/13/2018 4. No supraclavicular adenopathy. 5. No evidence of metastatic disease below the diaphragms. Electronically Signed   By: Suzy Bouchard M.D.   On: 07/13/2018 16:14    Pending Labs Unresulted Labs (From admission, onward)    Start     Ordered   07/13/18 1742  Osmolality  Once,   R     07/13/18 1741   07/13/18 1742  Sodium, urine, random  Once,   R     07/13/18 1741   07/13/18 1742  Osmolality, urine  Once,   R     07/13/18 1741   07/13/18 1739  Lactic acid, plasma  Once,   STAT     07/13/18 1738   07/13/18 1739  CK  Once,   R     07/13/18 1738   Signed and Held  Urine culture  Once,   R     Signed and Held   Signed and Held  Basic metabolic panel  Tomorrow morning,   R     Signed and Held   Signed and Held  CBC  Tomorrow morning,   R     Signed and Held   Signed and Held  PSA  Once,   R     Signed and Held          Vitals/Pain Today's Vitals   07/13/18 1724 07/13/18 1725 07/13/18 1726 07/13/18 1730  BP:      Pulse: 70 71 71 73  Resp: (!) 23 (!) 22 17 (!) 24  Temp:      TempSrc:      SpO2: 99% 99% 98% 98%  Weight:      Height:      PainSc:        Isolation Precautions No active isolations  Medications Medications  0.9 %  sodium chloride infusion ( Intravenous New Bag/Given 07/13/18 1721)  dexamethasone (DECADRON) injection 10 mg (10 mg Intravenous Given 07/13/18 1630)  levETIRAcetam (KEPPRA) IVPB 1500 mg/ 100 mL premix  (0 mg Intravenous Stopped 07/13/18 1649)    Mobility walks

## 2018-07-13 NOTE — ED Notes (Signed)
Attempted to recall MRI prior to patient transport. No answer.

## 2018-07-14 ENCOUNTER — Encounter (HOSPITAL_COMMUNITY): Payer: Self-pay

## 2018-07-14 DIAGNOSIS — E440 Moderate protein-calorie malnutrition: Secondary | ICD-10-CM

## 2018-07-14 DIAGNOSIS — E860 Dehydration: Secondary | ICD-10-CM

## 2018-07-14 DIAGNOSIS — D638 Anemia in other chronic diseases classified elsewhere: Secondary | ICD-10-CM

## 2018-07-14 DIAGNOSIS — R569 Unspecified convulsions: Secondary | ICD-10-CM

## 2018-07-14 DIAGNOSIS — F17210 Nicotine dependence, cigarettes, uncomplicated: Secondary | ICD-10-CM

## 2018-07-14 DIAGNOSIS — N179 Acute kidney failure, unspecified: Secondary | ICD-10-CM

## 2018-07-14 DIAGNOSIS — E871 Hypo-osmolality and hyponatremia: Secondary | ICD-10-CM

## 2018-07-14 DIAGNOSIS — C3490 Malignant neoplasm of unspecified part of unspecified bronchus or lung: Secondary | ICD-10-CM

## 2018-07-14 DIAGNOSIS — C7931 Secondary malignant neoplasm of brain: Secondary | ICD-10-CM

## 2018-07-14 DIAGNOSIS — R918 Other nonspecific abnormal finding of lung field: Secondary | ICD-10-CM

## 2018-07-14 LAB — CBC
HEMATOCRIT: 32.6 % — AB (ref 39.0–52.0)
HEMOGLOBIN: 11.3 g/dL — AB (ref 13.0–17.0)
MCH: 31 pg (ref 26.0–34.0)
MCHC: 34.7 g/dL (ref 30.0–36.0)
MCV: 89.6 fL (ref 78.0–100.0)
Platelets: 335 10*3/uL (ref 150–400)
RBC: 3.64 MIL/uL — AB (ref 4.22–5.81)
RDW: 13.3 % (ref 11.5–15.5)
WBC: 3.2 10*3/uL — ABNORMAL LOW (ref 4.0–10.5)

## 2018-07-14 LAB — BASIC METABOLIC PANEL
ANION GAP: 9 (ref 5–15)
BUN: 29 mg/dL — ABNORMAL HIGH (ref 8–23)
CO2: 21 mmol/L — AB (ref 22–32)
Calcium: 9 mg/dL (ref 8.9–10.3)
Chloride: 102 mmol/L (ref 98–111)
Creatinine, Ser: 1.33 mg/dL — ABNORMAL HIGH (ref 0.61–1.24)
GFR, EST AFRICAN AMERICAN: 56 mL/min — AB (ref >60)
GFR, EST NON AFRICAN AMERICAN: 48 mL/min — AB (ref >60)
GLUCOSE: 163 mg/dL — AB (ref 70–99)
POTASSIUM: 4.6 mmol/L (ref 3.5–5.1)
Sodium: 132 mmol/L — ABNORMAL LOW (ref 135–145)

## 2018-07-14 LAB — SODIUM, URINE, RANDOM: Sodium, Ur: 83 mmol/L

## 2018-07-14 LAB — GLUCOSE, CAPILLARY: Glucose-Capillary: 118 mg/dL — ABNORMAL HIGH (ref 70–99)

## 2018-07-14 LAB — OSMOLALITY, URINE: OSMOLALITY UR: 451 mosm/kg (ref 300–900)

## 2018-07-14 NOTE — Consult Note (Signed)
Wyatt Stein  WVP:[id number] DOB: 1934-07-15 DOA: 07/13/2018 PCP: Wenda Low, MD    LOS: 1 day   Reason for Consult / Chief Complaint:  Right upper lobe mass and mediastinal lymphadenopathy  Consulting MD and date:  Dr. Starla Link, [facility]  HPI/Summary of hospital stay:  84 year old man with history of tobacco, COPD, hypertension, prior prostate cancer and seizures (off Keppra).  He was seen by Dr. Melvyn Novas in our office for right upper lobe mass, mediastinal lymphadenopathy.  Endobronchial biopsies were performed but showed only necrosis, were nondiagnostic.  He was admitted on 8/31 with seizure.  His evaluation is revealed metastatic right frontal lobe lesion.  Received IV Decadron, Keppra.  Is being evaluated by Dr. Alvy Bimler with hematology/oncology and Dr. Lisbeth Renshaw with radiation oncology for brain radiation.  Question currently is best strategy for definitive tissue diagnosis.   Subjective:  No further seizure activity  Objective   Blood pressure (!) 161/77, pulse 68, temperature 97.6 F (36.4 C), temperature source Oral, resp. rate 18, height 5\' 8"  (1.727 m), weight 59.3 kg, SpO2 95 %.        Intake/Output Summary (Last 24 hours) at 07/14/2018 0831 Last data filed at 07/14/2018 0400 Gross per 24 hour  Intake 1717.5 ml  Output 450 ml  Net 1267.5 ml   Filed Weights   07/13/18 1402 07/13/18 1846  Weight: 61.2 kg 59.3 kg    Examination: General: Thin gentleman lying comfortably in bed HENT: Oropharynx clear, no lymphadenopathy, no stridor Lungs: Clear on the left, some bronchial breath sounds on the right but no crackles or wheezing Cardiovascular: Regular, no murmur.  He is currently hypertensive Abdomen: Soft, nontender With positive bowel sounds Extremities: No edema Neuro: Awake, alert, no focal findings  Consults: date of consult/date signed off & final recs:   Heme/Onc 8/31 Rad/Onc 8/31  Procedures:  Significant Diagnostic Tests: PET scan 5/46 >> hypermetabolic right  upper lobe mass with more hypermetabolism peripherally and superiorly.  Hypermetabolic ipsilateral mediastinal nodes.  Partially imaged right frontal lobe hypermetabolic lesion  Head CT 2/70 >> 16 x 18 mm hyperdense mass right basal ganglia with extensive surrounding edema and 7 mm midline shift, likely hemorrhagic metastatic disease  MRI brain 8/31 >> enhancing mass in the right basal ganglia with extensive surrounding edema, a second 2 mm enhancing lesion in the right frontal lobe also consistent with metastatic disease, mass-effect and 7 mm midline shift due to edema  Micro Data: Urine culture 8/31 >>   Antimicrobials:   Resolved Hospital Problem list    Assessment & Plan:  Right upper lobe mass with mediastinal lymphadenopathy, evidence for metastatic brain lesions. Unfortunately endobronchial biopsies were nondiagnostic, necrotic.  I think that we could get a tissue diagnosis via interbronchial ultrasound of the mediastinal lymph nodes.  Discussed with Dr Alvy Bimler today.  Given the benefits of performing molecular studies, probably would be more beneficial to get a core biopsy via interventional radiology.  This will provide more tissue and allow more studies to be performed than nodal cytology.  Mediastinal staging will be less important since we know that this is stage IV disease given the brain lesions.  I think the highest yield study would be the core biopsy.  We will discuss with interventional radiology.  If CT-guided needle biopsy is not possible then certainly could reconsider EBUS under general anesthesia.  Seizures.  History of seizure disorder but almost certainly precipitated in this instance by his metastatic disease Decadron as ordered Keppra as ordered  Tobacco Abuse Will need to focus on cessation Eval for COPD going forward  Disposition / Summary of Today's Plan 07/14/18     Best Practice / Goals of Care / Disposition.   DVT prophylaxis: SCD GI prophylaxis:  None Diet: Heart Healthy Mobility: Code Status: Full Family Communication:  Labs   CBC: Recent Labs  Lab 07/13/18 1520 07/14/18 0403  WBC 6.6 3.2*  NEUTROABS 5.4  --   HGB 12.9* 11.3*  HCT 36.6* 32.6*  MCV 90.1 89.6  PLT 297 161   Basic Metabolic Panel: Recent Labs  Lab 07/13/18 1520 07/14/18 0403  NA 129* 132*  K 4.9 4.6  CL 95* 102  CO2 22 21*  GLUCOSE 119* 163*  BUN 33* 29*  CREATININE 1.70* 1.33*  CALCIUM 8.7* 9.0   GFR: Estimated Creatinine Clearance: 35.9 mL/min (A) (by C-G formula based on SCr of 1.33 mg/dL (H)). Recent Labs  Lab 07/13/18 1520 07/13/18 1950 07/14/18 0403  WBC 6.6  --  3.2*  LATICACIDVEN  --  0.9  --    Liver Function Tests: Recent Labs  Lab 07/13/18 1520  AST 21  ALT 7  ALKPHOS 86  BILITOT 0.8  PROT 7.1  ALBUMIN 3.3*   No results for input(s): LIPASE, AMYLASE in the last 168 hours. No results for input(s): AMMONIA in the last 168 hours. ABG No results found for: PHART, PCO2ART, PO2ART, HCO3, TCO2, ACIDBASEDEF, O2SAT  Coagulation Profile: No results for input(s): INR, PROTIME in the last 168 hours. Cardiac Enzymes: Recent Labs  Lab 07/13/18 1520 07/13/18 1950  CKTOTAL  --  23*  TROPONINI <0.03  --    HbA1C: No results found for: HGBA1C CBG: Recent Labs  Lab 07/12/18 1417 07/13/18 1410  GLUCAP 99 141*     Review of Systems:    Past medical history  He,  has a past medical history of Cancer ([place]) (2008), COPD (chronic obstructive pulmonary disease) ([place]), GERD (gastroesophageal reflux disease), History of blood transfusion (2006), Hypertension, and PONV (postoperative nausea and vomiting) (40 yrs ago).   Surgical History    Past Surgical History:  Procedure Laterality Date  . back fracture surgery  Lumbar 2006   screws and , back brace prn  . BACK SURGERY    . COLONOSCOPY WITH PROPOFOL N/A 07/01/2013   Procedure: COLONOSCOPY WITH PROPOFOL;  Surgeon: Garlan Fair, MD;  Location: [facility];  Service:  Endoscopy;  Laterality: N/A;  . ESOPHAGOGASTRODUODENOSCOPY (EGD) WITH PROPOFOL N/A 07/01/2013   Procedure: ESOPHAGOGASTRODUODENOSCOPY (EGD) WITH PROPOFOL;  Surgeon: Garlan Fair, MD;  Location: [facility];  Service: Endoscopy;  Laterality: N/A;  . INTRAMEDULLARY (IM) NAIL INTERTROCHANTERIC Left 08/16/2015   Procedure: INTRAMEDULLARY (IM) NAIL INTERTROCHANTRIC;  Surgeon: Mcarthur Rossetti, MD;  Location: [facility] ORS;  Service: Orthopedics;  Laterality: Left;  . seed implant for prostate cancer    . TONSILLECTOMY    . VIDEO BRONCHOSCOPY Bilateral 07/02/2018   Procedure: VIDEO BRONCHOSCOPY WITH FLUORO;  Surgeon: Tanda Rockers, MD;  Location: [facility];  Service: Cardiopulmonary;  Laterality: Bilateral;     Social History   Social History   Socioeconomic History  . Marital status: Married    Spouse name: [name]   . Number of children: 2  . Years of education: College   . Highest education level: Not on file  Occupational History  . Occupation: Retired   Scientific laboratory technician  . Financial resource strain: Not on file  . Food insecurity:    Worry: Not on file  Inability: Not on file  . Transportation needs:    Medical: Not on file    Non-medical: Not on file  Tobacco Use  . Smoking status: Current Every Day Smoker    Packs/day: 0.50    Years: 60.00    Pack years: 30.00    Types: Cigarettes  . Smokeless tobacco: Never Used  Substance and Sexual Activity  . Alcohol use: No    Alcohol/week: 14.0 standard drinks    Types: 14 Shots of liquor per week    Comment: Former--pt states none since Oct  . Drug use: No  . Sexual activity: Yes    Birth control/protection: None  Lifestyle  . Physical activity:    Days per week: Not on file    Minutes per session: Not on file  . Stress: Not on file  Relationships  . Social connections:    Talks on phone: Not on file    Gets together: Not on file    Attends religious service: Not on file    Active member of club or organization: Not  on file    Attends meetings of clubs or organizations: Not on file    Relationship status: Not on file  . Intimate partner violence:    Fear of current or ex partner: Not on file    Emotionally abused: Not on file    Physically abused: Not on file    Forced sexual activity: Not on file  Other Topics Concern  . Not on file  Social History Narrative   Patient lives at home with wife.   Patient is retired.    Patient is a college grad.    Patient has 2 children.   ,  reports that he has been smoking cigarettes. He has a 30.00 pack-year smoking history. He has never used smokeless tobacco. He reports that he does not drink alcohol or use drugs.   Family history   His family history includes Colon cancer in his father; Heart Problems in his mother.   Allergies Allergies  Allergen Reactions  . Ace Inhibitors Anaphylaxis and Swelling  . Ramipril Anaphylaxis and Itching    Home meds  Prior to Admission medications   Medication Sig Start Date End Date Taking? Authorizing Provider  amLODipine (NORVASC) 5 MG tablet Take 5 mg by mouth daily. 01/18/18  Yes [provider]  benzonatate (TESSALON) 200 MG capsule Take 1 capsule by mouth 3 (three) times daily as needed for cough.  06/19/18  Yes [provider]  cholecalciferol (VITAMIN D) 1000 UNITS tablet Take 1,000 Units by mouth daily.   Yes [provider]  [employer] B-1 100 MG tablet Take 100 mg by mouth daily. 07/17/15  Yes [provider]  ferrous sulfate 325 (65 FE) MG tablet Take 325 mg by mouth daily with breakfast.   Yes [provider]  hydrochlorothiazide (HYDRODIURIL) 12.5 MG tablet Take 12.5 mg by mouth every morning. 01/17/18  Yes [provider]  latanoprost (XALATAN) 0.005 % ophthalmic solution Place 1 drop into both eyes at bedtime.  10/20/15  Yes [provider]  Magnesium 250 MG TABS Take 250 mg by mouth daily.    Yes [provider]  Multiple Vitamins-Minerals (CENTRUM  SILVER ADULT 50+) TABS Take 1 tablet by mouth daily.   Yes [provider]  vitamin B-12 (CYANOCOBALAMIN) 1000 MCG tablet Take 1,000 mcg by mouth daily.   Yes [provider]      Baltazar Apo, MD, PhD 07/14/2018, 8:45 AM  [facility] [phone] or if no answer [phone]

## 2018-07-14 NOTE — Consult Note (Signed)
Wyatt Stein  Patient Care Team: Wyatt Low, MD as PCP - General (Internal Medicine)  ASSESSMENT & PLAN:   Stage IV lung cancer until proven otherwise I have discussed this with pulmonologist For now, it appears that the next plan of care would involve a CT-guided biopsy of the lung mass Without specific subtype of disease or molecular studies, I would not be able to offer specific recommendation for chemotherapy He is advised to quit smoking permanently.  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 Lisbeth Stein, radiation oncologist In the meantime, should he will continue high-dose Keppra with dexamethasone  Acute on chronic renal failure, improving Likely secondary to dehydration Serum creatinine has improved  Goals of care The plan would be for him to get started with radiation therapy as soon as possible In the meantime, CT-guided lung biopsy is being arranged for further diagnostic purposes  Discharge planning If the patient remains stable within the next 24 to 48 hours, he can probably be discharged and resume the rest of his treatment as an outpatient. He will benefit from physical therapy and Occupational Therapy evaluation prior to discharge to make sure that he does not need in a skilled nursing facility upon discharge I have addressed multiple questions and concerns and I will return tomorrow to check on the patient   I spent 60 minutes counseling the patient face to face. The total time spent in the appointment was 80 minutes and more than 50% was on counseling.  All questions were answered. The patient knows to call the clinic with any problems, questions or concerns.  Wyatt Lark, MD 07/14/2018 9:05 AM   CHIEF COMPLAINTS/PURPOSE OF CONSULTATION:  Right lung mass, multiple brain metastasis, suspicious for stage IV lung cancer, on background history of smoker and history of  prostate cancer  HISTORY OF PRESENTING ILLNESS:  Wyatt Stein 84 y.o. male is seen at the request of hospitalist for evaluation on a patient who presented to the emergency department due to recurrent seizures with abnormal imaging study.  The patient had significant smoking history, half a pack of cigarettes since he was a teenager.  At one point, he had quit smoking for 8 years but had resumed smoking.  He also have history of prostate cancer status post seed implantation. Last PSA testing from yesterday was 2.48.  He also had history of recurrent seizures in the past and follow with Dr. Delice Stein, neurologist.  According to the patient, his last seizure was over a year ago.  He takes Keppra at home.  Last brain MRI from October 2014 showed no evidence of brain lesions.  The patient had no memory or recollection about his seizure activity yesterday. According to his wife who was present at the time, right after breakfast, the patient complained of some mild nausea.  His soon after that, he had a tonic-clonic seizure lasting approximately 20 seconds.  His wife noted that the seizure appears to be somewhat more intense.  The patient has some mild urinary incontinence. She called 911 and the patient was brought into the emergency department for evaluation.  He was seen in the outpatient pulmonary clinic due to primary care doctor's concern for weight loss and chronic cough since July 2019. He had chest x-ray performed on 06/20/2018 which show a large right upper lobe mass, worrisome for cancer. Subsequently, chest CT scan was done on 06/21/2018 which showed large right hilar mass most consistent with  primary lung cancer.  It appears to abut the chest wall, mediastinum and superior right hilum with central necrosis.  Postobstructive pneumonitis was also noted. On 07/02/2018, he underwent flexible fiberoptic bronchoscopy.  According to the operative Stein, it was noted that the patient had a mass prolapsing  out of the right upper lobe with complete obstruction.  Bronchoalveolar lavage and endobronchial biopsy were performed. Unfortunately, pathology report was nondiagnostic showing completely necrotic tumor.  Specific cell type is unknown.  PET CT scan was ordered and it was performed on Friday afternoon.  PET CT scan from 07/12/18 revealed hypermetabolic right upper lobe mass consistent with primary bronchogenic carcinoma with ipsilateral hyperbolic mediastinal metastasis.  It was noted that the patient also have a metastatic brain lesion in the right frontal lobe. In the emergency department yesterday, he underwent CT scan of the head which show hypodense mass in the right basal ganglia with extensive surrounding edema and midline shift.  Radiologist felt this represent hemorrhagic metastatic lesion. MRI of the brain showed enhancing mass lesion on the right basal ganglia with extensive surrounding edema.  A second 2 mm enhancing lesion is also noted on the right frontal lobe consistent with metastatic disease.  There is mass-effect and midline shift due to the extensive edema.  Since admission to the hospital, he received higher dose loading Keppra, IV dexamethasone and supportive care.  He had no further recurrence of seizures since. Currently, he denies headache or focal neurological deficits.  Overall, he felt a little weak.  He had no memory of what has happened yesterday He had mild intermittent cough but never hemoptysis His appetite is poor but at baseline, his wife and daughter stated he usually goes to the gym to exercise. MEDICAL HISTORY:  Past Medical History:  Diagnosis Date  . Cancer [facility]) 2008   prostate, sees dr Wyatt Stein once a year  . COPD (chronic obstructive pulmonary disease) ([place])   . GERD (gastroesophageal reflux disease)   . History of blood transfusion 2006  . Hypertension   . PONV (postoperative nausea and vomiting) 40 yrs ago   aspiration pneumonia after tooth extraxtion     SURGICAL HISTORY: Past Surgical History:  Procedure Laterality Date  . back fracture surgery  Lumbar 2006   screws and , back brace prn  . BACK SURGERY    . COLONOSCOPY WITH PROPOFOL N/A 07/01/2013   Procedure: COLONOSCOPY WITH PROPOFOL;  Surgeon: Garlan Fair, MD;  Location: [facility];  Service: Endoscopy;  Laterality: N/A;  . ESOPHAGOGASTRODUODENOSCOPY (EGD) WITH PROPOFOL N/A 07/01/2013   Procedure: ESOPHAGOGASTRODUODENOSCOPY (EGD) WITH PROPOFOL;  Surgeon: Garlan Fair, MD;  Location: [facility];  Service: Endoscopy;  Laterality: N/A;  . INTRAMEDULLARY (IM) NAIL INTERTROCHANTERIC Left 08/16/2015   Procedure: INTRAMEDULLARY (IM) NAIL INTERTROCHANTRIC;  Surgeon: Mcarthur Rossetti, MD;  Location: [facility];  Service: Orthopedics;  Laterality: Left;  . seed implant for prostate cancer    . TONSILLECTOMY    . VIDEO BRONCHOSCOPY Bilateral 07/02/2018   Procedure: VIDEO BRONCHOSCOPY WITH FLUORO;  Surgeon: Tanda Rockers, MD;  Location: [facility];  Service: Cardiopulmonary;  Laterality: Bilateral;    SOCIAL HISTORY: Social History   Socioeconomic History  . Marital status: Married    Spouse name: [name]   . Number of children: 2  . Years of education: College   . Highest education level: Not on file  Occupational History  . Occupation: Retired   Scientific laboratory technician  . Financial resource strain: Not on file  . Food insecurity:  Worry: Not on file    Inability: Not on file  . Transportation needs:    Medical: Not on file    Non-medical: Not on file  Tobacco Use  . Smoking status: Current Every Day Smoker    Packs/day: 0.50    Years: 60.00    Pack years: 30.00    Types: Cigarettes  . Smokeless tobacco: Never Used  Substance and Sexual Activity  . Alcohol use: No    Alcohol/week: 14.0 standard drinks    Types: 14 Shots of liquor per week    Comment: Former--pt states none since Oct  . Drug use: No  . Sexual activity: Yes    Birth control/protection: None   Lifestyle  . Physical activity:    Days per week: Not on file    Minutes per session: Not on file  . Stress: Not on file  Relationships  . Social connections:    Talks on phone: Not on file    Gets together: Not on file    Attends religious service: Not on file    Active member of club or organization: Not on file    Attends meetings of clubs or organizations: Not on file    Relationship status: Not on file  . Intimate partner violence:    Fear of current or ex partner: Not on file    Emotionally abused: Not on file    Physically abused: Not on file    Forced sexual activity: Not on file  Other Topics Concern  . Not on file  Social History Narrative   Patient lives at home with wife.   Patient is retired.    Patient is a college grad.    Patient has 2 children.     FAMILY HISTORY: Family History  Problem Relation Age of Onset  . Heart Problems Mother   . Colon cancer Father     ALLERGIES:  is allergic to ace inhibitors and ramipril.  MEDICATIONS:  Current Facility-Administered Medications  Medication Dose Route Frequency Provider Last Rate Last Dose  . 0.9 %  sodium chloride infusion   Intravenous Continuous Rai, Ripudeep K, MD 75 mL/hr at 07/14/18 0400    . 0.9 %  sodium chloride infusion   Intravenous Continuous Rai, Ripudeep K, MD   Stopped at 07/13/18 1807  . acetaminophen (TYLENOL) tablet 650 mg  650 mg Oral Q6H PRN Rai, Ripudeep K, MD       Or  . acetaminophen (TYLENOL) suppository 650 mg  650 mg Rectal Q6H PRN Rai, Ripudeep K, MD      . amLODipine (NORVASC) tablet 5 mg  5 mg Oral Daily Rai, Ripudeep K, MD      . benzonatate (TESSALON) capsule 200 mg  200 mg Oral TID PRN Rai, Ripudeep K, MD      . cholecalciferol (VITAMIN D) tablet 1,000 Units  1,000 Units Oral Daily Rai, Ripudeep K, MD   1,000 Units at 07/13/18 2000  . dexamethasone (DECADRON) injection 4 mg  4 mg Intravenous Q6H Rai, Ripudeep K, MD   4 mg at 07/14/18 0507  . latanoprost (XALATAN) 0.005 %  ophthalmic solution 1 drop  1 drop Both Eyes QHS Rai, Ripudeep K, MD   1 drop at 07/13/18 2227  . levETIRAcetam (KEPPRA) 750 mg in sodium chloride 0.9 % 100 mL IVPB  750 mg Intravenous Q12H Rai, Ripudeep K, MD 430 mL/hr at 07/14/18 0823 750 mg at 07/14/18 0823  . magnesium oxide (MAG-OX) tablet 200 mg  200  mg Oral Daily Rai, Ripudeep K, MD   200 mg at 07/13/18 2000  . multivitamin with minerals tablet 1 tablet  1 tablet Oral Daily Rai, Ripudeep K, MD   1 tablet at 07/13/18 2000  . thiamine (VITAMIN B-1) tablet 100 mg  100 mg Oral Daily Rai, Ripudeep K, MD   100 mg at 07/13/18 2000  . vitamin B-12 (CYANOCOBALAMIN) tablet 1,000 mcg  1,000 mcg Oral Daily Rai, Ripudeep K, MD   1,000 mcg at 07/13/18 2000    REVIEW OF SYSTEMS:   Constitutional: Denies fevers, chills or abnormal night sweats Eyes: Denies blurriness of vision, double vision or watery eyes Ears, nose, mouth, throat, and face: Denies mucositis or sore throat Cardiovascular: Denies palpitation, chest discomfort or lower extremity swelling Gastrointestinal:  Denies nausea, heartburn or change in bowel habits Skin: Denies abnormal skin rashes Lymphatics: Denies new lymphadenopathy or easy bruising Behavioral/Psych: Mood is stable, no new changes  All other systems were reviewed with the patient and are negative.  PHYSICAL EXAMINATION: ECOG PERFORMANCE STATUS: 2 - Symptomatic, <50% confined to bed  Vitals:   07/14/18 0600 07/14/18 0743  BP:    Pulse:    Resp: 18   Temp:  97.6 F (36.4 C)  SpO2:     Filed Weights   07/13/18 1402 07/13/18 1846  Weight: 135 lb (61.2 kg) 130 lb 11.7 oz (59.3 kg)    GENERAL:alert, no distress and comfortable.  He looks thin but not cachectic SKIN: skin color, texture, turgor are normal, no rashes or significant lesions EYES: normal, conjunctiva are pink and non-injected, sclera clear OROPHARYNX:no exudate, no erythema and lips, buccal mucosa, and tongue normal  NECK: supple, thyroid normal size,  non-tender, without nodularity LYMPH:  no palpable lymphadenopathy in the cervical, axillary or inguinal LUNGS: clear to auscultation and percussion with normal breathing effort HEART: regular rate & rhythm and no murmurs and no lower extremity edema ABDOMEN:abdomen soft, non-tender and normal bowel sounds Musculoskeletal:no cyanosis of digits and no clubbing  PSYCH: alert & oriented x 3 with fluent speech NEURO: no focal motor/sensory deficits  LABORATORY DATA:  I have reviewed the data as listed Lab Results  Component Value Date   WBC 3.2 (L) 07/14/2018   HGB 11.3 (L) 07/14/2018   HCT 32.6 (L) 07/14/2018   MCV 89.6 07/14/2018   PLT 335 07/14/2018   Recent Labs    07/13/18 1520 07/14/18 0403  NA 129* 132*  K 4.9 4.6  CL 95* 102  CO2 22 21*  GLUCOSE 119* 163*  BUN 33* 29*  CREATININE 1.70* 1.33*  CALCIUM 8.7* 9.0  GFRNONAA 36* 48*  GFRAA 41* 56*  PROT 7.1  --   ALBUMIN 3.3*  --   AST 21  --   ALT 7  --   ALKPHOS 86  --   BILITOT 0.8  --     RADIOGRAPHIC STUDIES: I have personally reviewed the radiological images as listed and agreed with the findings in the report. Dg Chest 2 View  Result Date: 07/13/2018 CLINICAL DATA:  Seizure activity earlier today while sitting outside at home with his wife. 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 EXAM: CHEST - 2 VIEW COMPARISON:  PET-CT yesterday. CT chest 06/21/2018. Chest x-rays 06/20/2018 and earlier. FINDINGS: Cardiac silhouette normal in size, unchanged. Thoracic aorta mildly tortuous, unchanged. Large RIGHT UPPER LOBE lung mass extending into the RIGHT hilum as noted on the recent examinations. Lungs hyperinflated but clear otherwise. No pleural effusions. IMPRESSION:  Large RIGHT UPPER LOBE lung mass extending into the RIGHT hilum as noted previously. No new abnormalities. Electronically Signed   By: Evangeline Dakin M.D.   On: 07/13/2018 16:12   Dg Chest 2 View  Result Date: 06/20/2018 CLINICAL DATA:   Cough EXAM: CHEST - 2 VIEW COMPARISON:  July 25, 2016 FINDINGS: There is an apparent mass arising in the anterior segment right upper lobe measuring 8.6 x 8.4 x 8.5 cm. No other parenchymal lung lesion evident. Lungs are somewhat hyperexpanded. Heart size and pulmonary vascularity are normal. No adenopathy. There is postoperative change in the lower thoracic and lumbar spine regions. There is thoracic lordosis. No blastic or lytic bone lesions are demonstrable. IMPRESSION: Large apparent mass arising from the anterior segment right upper lobe, presumed neoplasm. Advise contrast enhanced chest CT to further assess. Lungs otherwise are clear although hyperexpanded. No adenopathy appreciable. These results will be called to the ordering clinician or representative by the Radiologist Assistant, and communication documented in the PACS or zVision Dashboard. Electronically Signed   By: Lowella Grip III M.D.   On: 06/20/2018 14:56   Ct Head Wo Contrast  Result Date: 07/13/2018 CLINICAL DATA:  New onset seizure EXAM: CT HEAD WITHOUT CONTRAST TECHNIQUE: Contiguous axial images were obtained from the base of the skull through the vertex without intravenous contrast. COMPARISON:  MRI head 09/05/2013, 03/25/2012 FINDINGS: Brain: Hyperdense mass in the right basal ganglia measures 16 x 18 mm with extensive surrounding edema. Findings most compatible with hemorrhagic metastasis with vasogenic edema. 7 mm of midline shift. No other mass lesions are seen on unenhanced imaging. Moderate atrophy and moderate chronic microvascular ischemic change in the white matter. Vascular: Negative for hyperdense vessel Skull: Negative Sinuses/Orbits: Negative Other: None IMPRESSION: 16 x 18 mm hyperdense mass right basal ganglia with extensive surrounding edema and 7 mm midline shift. Findings most compatible with hemorrhagic metastatic disease. Patient has a large lung mass which is hypermetabolic on PET scan yesterday compatible  with lung carcinoma. Moderate atrophy and moderate chronic microvascular ischemia These results were called by telephone at the time of interpretation on 07/13/2018 at 4:03 pm to Dr. Nanda Quinton , who verbally acknowledged these results. Electronically Signed   By: Franchot Gallo M.D.   On: 07/13/2018 16:04   Ct Chest W Contrast  Result Date: 06/21/2018 CLINICAL DATA:  RIGHT upper lobe mass, abnormal chest radiograph EXAM: CT CHEST WITH CONTRAST TECHNIQUE: Multidetector CT imaging of the chest was performed during intravenous contrast administration. Sagittal and coronal MPR images reconstructed from axial data set. CONTRAST:  54m ISOVUE-300 IOPAMIDOL (ISOVUE-300) INJECTION 61% IV COMPARISON:  Chest radiograph 06/20/2018 FINDINGS: Cardiovascular: Atherosclerotic calcifications aorta and coronary arteries. Aorta normal caliber. Pulmonary arteries grossly patent on non targeted exam. No pericardial effusion. Mediastinum/Nodes: Esophagus and visualized base of cervical region normal appearance. Enlarged RIGHT paratracheal lymph node 13 mm short axis image 58 and 19 mm short axis image 63. Necrotic mediastinal lymph node 16 mm short axis image 65. Lungs/Pleura: Large macrolobulated RIGHT upper lobe mass measuring 9.6 x 5.5 x 8.4 cm in size consistent with a primary pulmonary neoplasm. Mass demonstrates large areas of central necrosis. Mass abuts the anterior chest wall over a broad surface and also abuts the mediastinum over at least 5.6 cm length. Mass extends to the superior aspect of the RIGHT pulmonary hilum. Postobstructive pneumonitis versus lymphangitic tumor spread peripheral to the mass in the RIGHT upper lobe. Underlying panlobular emphysema. RIGHT upper lobe scarring. Tiny foci of ground-glass opacity  are seen in both lower lobes, nonspecific. Minimal dependent pleural effusion at the inferior RIGHT lung base. Calcified granuloma LEFT lower lobe image 87. No additional pulmonary mass/nodule. Upper Abdomen:  Questionable 12 mm RIGHT adrenal nodule versus nonspecific thickening. Remaining visualized upper abdomen unremarkable. Musculoskeletal: Prior thoracolumbar spinal fixation with old compression fractures of multiple vertebra. No discrete bone lesions. IMPRESSION: Large RIGHT upper lobe mass 9.6 x 5.5 x 8.4 cm in size consistent with a primary pulmonary neoplasm. Mass abuts the anterior chest wall, mediastinum and superior RIGHT hilum and demonstrates central necrosis. Postobstructive pneumonitis versus lymphangitic tumor spread in RIGHT upper lobe. Underlying pan lobular emphysema. Tiny scattered nonspecific ground-glass opacities in the lower lobes. Mediastinal adenopathy as above. Aortic Atherosclerosis (ICD10-I70.0) and Emphysema (ICD10-J43.9). Electronically Signed   By: Lavonia Dana M.D.   On: 06/21/2018 18:56   Mr Jeri Cos HC Contrast  Result Date: 07/13/2018 CLINICAL DATA:  Metastatic lung cancer staging EXAM: MRI HEAD WITHOUT AND WITH CONTRAST TECHNIQUE: Multiplanar, multiecho pulse sequences of the brain and surrounding structures were obtained without and with intravenous contrast. CONTRAST:  82m MULTIHANCE GADOBENATE DIMEGLUMINE 529 MG/ML IV SOLN COMPARISON:  CT head 07/13/2018 FINDINGS: Brain: Enhancing mass lesion in the right basal ganglia measures 22 x 17 x 21 mm. The mass has irregular enhancement with irregular contour to the lesion. Large amount of surrounding edema. Lesion is hyperdense on CT suggesting hemorrhage however MRI does not confirm hemorrhage in the lesion. 2 mm enhancing nodule in the right frontal lobe within the white matter edema. This is consistent with a second brain metastasis. No other enhancing nodules identified. Extensive edema surrounding the right basal ganglia lesion with mass-effect on the lateral ventricle. 7 mm midline shift to the left. Generalized atrophy. Chronic microvascular ischemic changes in the white matter. Negative for acute infarct. Vascular: Normal  arterial flow voids Skull and upper cervical spine: Negative Sinuses/Orbits: Negative Other: None IMPRESSION: Enhancing mass lesion right basal ganglia with extensive surrounding edema. Given history of lung cancer this is consistent with metastatic disease. Second 2 mm enhancing lesion in the right frontal lobe also consistent with metastatic disease. There is mass-effect and 7 mm midline shift to the left due to extensive edema. Electronically Signed   By: CFranchot GalloM.D.   On: 07/13/2018 20:18   Nm Pet Image Initial (pi) Skull Base To Thigh  Result Date: 07/13/2018 CLINICAL DATA:  Initial treatment strategy for lung mass. EXAM: NUCLEAR MEDICINE PET SKULL BASE TO THIGH TECHNIQUE: 6.7 mCi F-18 FDG was injected intravenously. Full-ring PET imaging was performed from the skull base to thigh after the radiotracer. CT data was obtained and used for attenuation correction and anatomic localization. Fasting blood glucose: 99 mg/dl COMPARISON:  Chest CT 06/21/2018 FINDINGS: Mediastinal blood pool activity: SUV max 2.2 NECK: High-density lesion in the medial RIGHT frontal lobe has hypermetabolic activity above background white matter consistent with brain metastasis. No hypermetabolic lymph nodes in the neck. Incidental CT findings: none CHEST: Large RIGHT upper lobe mass measuring 8 cm is intensely hypermetabolic with SUV max 18. Portions of hypermetabolic central necrosis in the posterolateral aspect of mass. Enlarged hypermetabolic RIGHT lower paratracheal lymph node. No hypermetabolic supraclavicular nodes Incidental CT findings: Coronary artery calcification and aortic atherosclerotic calcification. ABDOMEN/PELVIS: No abnormal hypermetabolic activity within the liver, pancreas, adrenal glands, or spleen. No hypermetabolic lymph nodes in the abdomen or pelvis. Incidental CT findings: Brachytherapy seeds the prostate gland SKELETON: No focal hypermetabolic activity to suggest skeletal metastasis. Incidental CT  findings:  Posterior lumbar fusion IMPRESSION: 1. Hypermetabolic RIGHT upper lobe mass consists primary bronchogenic carcinoma. 2. Ipsilateral hypermetabolic mediastinal nodal metastasis. 3. Metastatic brain lesion in the RIGHT frontal lobe partially imaged. See comparison head CT from 07/13/2018 4. No supraclavicular adenopathy. 5. No evidence of metastatic disease below the diaphragms. Electronically Signed   By: Suzy Bouchard M.D.   On: 07/13/2018 16:14

## 2018-07-14 NOTE — Progress Notes (Signed)
Patient ID: Wyatt Stein, male   DOB: Mar 08, 1934, 84 y.o.   MRN: [id number]  PROGRESS NOTE    CORDON GASSETT  JOA:[id number] DOB: Apr 07, 1934 DOA: 07/13/2018 PCP: Wenda Low, MD   Brief Narrative:  84 year old male with history of COPD, hypertension, GERD, prior prostate cancer in 2008, prior history of seizures currently off Keppra, recently diagnosed right upper lung mass for which he underwent bronchoscopy on 07/02/2018 and biopsy was nondiagnostic for lung cancer on the PET scan done on 07/12/2018 showed right upper lobe mass consistent with bronchogenic carcinoma, ipsilateral mediastinal nodal metastasis, metastatic brain lesion in the right frontal lobe presented on 07/13/2018 with seizure.  Patient was started on intravenous Keppra and Decadron.  Neurology on phone recommended Keppra twice a day.  Neurosurgery was consulted by the ER provider who recommended medical management with no surgical needs currently.  Oncology and pulmonary were consulted.   Assessment & Plan:   Principal Problem:   Lung mass Active Problems:   Anemia of chronic disease   Seizure (HCC)   Malnutrition of moderate degree   Brain metastases (HCC)  New onset of seizure in a patient with history of seizures -Probably secondary to brain metastases -Case was discussed with neurology on phone by admitting hospitalist who recommended Keppra 750 mg twice a day IV for now, no need for EEG and outpatient follow-up with Dr. Aquino/neurology -No seizures overnight -Continue Decadron  Right upper lobe mass/probable bronchogenic carcinoma with brain metastases with surrounding edema -Case was discussed with neurosurgery/Dr. Vertell Limber by ER provider on phone on 07/13/2018 who recommended medical management with antiseizure medications and steroids and no need for any surgical intervention.  Continue Decadron 4 milligrams IV every 6 hours.  MRI of the brain confirmed brain metastases with surrounding edema -Spoke with Dr.  Lamonte Sakai who will see the patient in consultation.  Patient will need tissue diagnosis from his lung mass as recent bronchoscopy and biopsy was unrevealing. -We will follow recommendations from Dr. Rozetta Nunnery.  Radiation oncology has been consulted by Dr. Alvy Bimler, recommendations  Anemia of chronic disease -Hemoglobin stable.  Monitor  Hyponatremia -Probably due to seizures/dehydration/HCTZ/SIADH -Improving -Continue IV fluids.  Repeat a.m. labs  Acute kidney injury -Probably secondary to dehydration/seizures.  Improving.  Continue IV fluids.  Hold HCTZ.  Repeat a.m. Labs  Moderate malnutrition -Follow nutrition recommendations  Hypertension -Monitor blood pressure.  Continue amlodipine  DVT prophylaxis: SCDs  code Status: Full Family Communication: Spoke to wife and daughter at bedside Disposition Plan: Depends on clinical outcome Consultants: Pulmonary/oncology. Neurosurgery was consulted on phone by ED provider Neurology was consulted by admitting hospitalist on phone Radiation oncology has been consulted by oncology  Procedures: None  Antimicrobials: None   Subjective: Patient seen and examined at bedside.  He is awake.  No overnight seizure-like activities.  Denies current fever, nausea or vomiting.  Objective: Vitals:   07/14/18 0410 07/14/18 0500 07/14/18 0600 07/14/18 0743  BP:      Pulse:      Resp:  18 18   Temp: 97.7 F (36.5 C)   97.6 F (36.4 C)  TempSrc: Oral   Oral  SpO2:      Weight:      Height:        Intake/Output Summary (Last 24 hours) at 07/14/2018 0841 Last data filed at 07/14/2018 0400 Gross per 24 hour  Intake 1717.5 ml  Output 450 ml  Net 1267.5 ml   Filed Weights   07/13/18 1402 07/13/18 1846  Weight:  61.2 kg 59.3 kg    Examination:  General exam: Appears calm and comfortable.  No distress Respiratory system: Bilateral decreased breath sounds at bases Cardiovascular system: S1 & S2 heard, Rate controlled Gastrointestinal system:  Abdomen is nondistended, soft and nontender. Normal bowel sounds heard. Extremities: No cyanosis, clubbing, edema  Central nervous system: Awake and answers questions. No focal neurological deficits. Moving extremities Skin: No rashes, lesions or ulcers Psychiatry: Slightly anxious    Data Reviewed: I have personally reviewed following labs and imaging studies  CBC: Recent Labs  Lab 07/13/18 1520 07/14/18 0403  WBC 6.6 3.2*  NEUTROABS 5.4  --   HGB 12.9* 11.3*  HCT 36.6* 32.6*  MCV 90.1 89.6  PLT 297 629   Basic Metabolic Panel: Recent Labs  Lab 07/13/18 1520 07/14/18 0403  NA 129* 132*  K 4.9 4.6  CL 95* 102  CO2 22 21*  GLUCOSE 119* 163*  BUN 33* 29*  CREATININE 1.70* 1.33*  CALCIUM 8.7* 9.0   GFR: Estimated Creatinine Clearance: 35.9 mL/min (A) (by C-G formula based on SCr of 1.33 mg/dL (H)). Liver Function Tests: Recent Labs  Lab 07/13/18 1520  AST 21  ALT 7  ALKPHOS 86  BILITOT 0.8  PROT 7.1  ALBUMIN 3.3*   No results for input(s): LIPASE, AMYLASE in the last 168 hours. No results for input(s): AMMONIA in the last 168 hours. Coagulation Profile: No results for input(s): INR, PROTIME in the last 168 hours. Cardiac Enzymes: Recent Labs  Lab 07/13/18 1520 07/13/18 1950  CKTOTAL  --  43*  TROPONINI <0.03  --    BNP (last 3 results) No results for input(s): PROBNP in the last 8760 hours. HbA1C: No results for input(s): HGBA1C in the last 72 hours. CBG: Recent Labs  Lab 07/12/18 1417 07/13/18 1410  GLUCAP 99 141*   Lipid Profile: No results for input(s): CHOL, HDL, LDLCALC, TRIG, CHOLHDL, LDLDIRECT in the last 72 hours. Thyroid Function Tests: No results for input(s): TSH, T4TOTAL, FREET4, T3FREE, THYROIDAB in the last 72 hours. Anemia Panel: No results for input(s): VITAMINB12, FOLATE, FERRITIN, TIBC, IRON, RETICCTPCT in the last 72 hours. Sepsis Labs: Recent Labs  Lab 07/13/18 1950  LATICACIDVEN 0.9    Recent Results (from the past  240 hour(s))  MRSA PCR Screening     Status: None   Collection Time: 07/13/18  6:56 PM  Result Value Ref Range Status   MRSA by PCR NEGATIVE NEGATIVE Final    Comment:        The GeneXpert MRSA Assay (FDA approved for NASAL specimens only), is one component of a comprehensive MRSA colonization surveillance program. It is not intended to diagnose MRSA infection nor to guide or monitor treatment for MRSA infections. Performed at [facility], [place] [address], [place], [place] [address]          Radiology Studies: Dg Chest 2 View  Result Date: 07/13/2018 CLINICAL DATA:  Seizure activity earlier today while sitting outside at home with his wife. 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 EXAM: CHEST - 2 VIEW COMPARISON:  PET-CT yesterday. CT chest 06/21/2018. Chest x-rays 06/20/2018 and earlier. FINDINGS: Cardiac silhouette normal in size, unchanged. Thoracic aorta mildly tortuous, unchanged. Large RIGHT UPPER LOBE lung mass extending into the RIGHT hilum as noted on the recent examinations. Lungs hyperinflated but clear otherwise. No pleural effusions. IMPRESSION: Large RIGHT UPPER LOBE lung mass extending into the RIGHT hilum as noted previously. No new abnormalities. Electronically Signed  By: Evangeline Dakin M.D.   On: 07/13/2018 16:12   Ct Head Wo Contrast  Result Date: 07/13/2018 CLINICAL DATA:  New onset seizure EXAM: CT HEAD WITHOUT CONTRAST TECHNIQUE: Contiguous axial images were obtained from the base of the skull through the vertex without intravenous contrast. COMPARISON:  MRI head 09/05/2013, 03/25/2012 FINDINGS: Brain: Hyperdense mass in the right basal ganglia measures 16 x 18 mm with extensive surrounding edema. Findings most compatible with hemorrhagic metastasis with vasogenic edema. 7 mm of midline shift. No other mass lesions are seen on unenhanced imaging. Moderate atrophy and moderate chronic microvascular ischemic change in the  white matter. Vascular: Negative for hyperdense vessel Skull: Negative Sinuses/Orbits: Negative Other: None IMPRESSION: 16 x 18 mm hyperdense mass right basal ganglia with extensive surrounding edema and 7 mm midline shift. Findings most compatible with hemorrhagic metastatic disease. Patient has a large lung mass which is hypermetabolic on PET scan yesterday compatible with lung carcinoma. Moderate atrophy and moderate chronic microvascular ischemia These results were called by telephone at the time of interpretation on 07/13/2018 at 4:03 pm to Dr. Nanda Quinton , who verbally acknowledged these results. Electronically Signed   By: Franchot Gallo M.D.   On: 07/13/2018 16:04   Mr Jeri Cos EU Contrast  Result Date: 07/13/2018 CLINICAL DATA:  Metastatic lung cancer staging EXAM: MRI HEAD WITHOUT AND WITH CONTRAST TECHNIQUE: Multiplanar, multiecho pulse sequences of the brain and surrounding structures were obtained without and with intravenous contrast. CONTRAST:  18mL MULTIHANCE GADOBENATE DIMEGLUMINE 529 MG/ML IV SOLN COMPARISON:  CT head 07/13/2018 FINDINGS: Brain: Enhancing mass lesion in the right basal ganglia measures 22 x 17 x 21 mm. The mass has irregular enhancement with irregular contour to the lesion. Large amount of surrounding edema. Lesion is hyperdense on CT suggesting hemorrhage however MRI does not confirm hemorrhage in the lesion. 2 mm enhancing nodule in the right frontal lobe within the white matter edema. This is consistent with a second brain metastasis. No other enhancing nodules identified. Extensive edema surrounding the right basal ganglia lesion with mass-effect on the lateral ventricle. 7 mm midline shift to the left. Generalized atrophy. Chronic microvascular ischemic changes in the white matter. Negative for acute infarct. Vascular: Normal arterial flow voids Skull and upper cervical spine: Negative Sinuses/Orbits: Negative Other: None IMPRESSION: Enhancing mass lesion right basal  ganglia with extensive surrounding edema. Given history of lung cancer this is consistent with metastatic disease. Second 2 mm enhancing lesion in the right frontal lobe also consistent with metastatic disease. There is mass-effect and 7 mm midline shift to the left due to extensive edema. Electronically Signed   By: Franchot Gallo M.D.   On: 07/13/2018 20:18   Nm Pet Image Initial (pi) Skull Base To Thigh  Result Date: 07/13/2018 CLINICAL DATA:  Initial treatment strategy for lung mass. EXAM: NUCLEAR MEDICINE PET SKULL BASE TO THIGH TECHNIQUE: 6.7 mCi F-18 FDG was injected intravenously. Full-ring PET imaging was performed from the skull base to thigh after the radiotracer. CT data was obtained and used for attenuation correction and anatomic localization. Fasting blood glucose: 99 mg/dl COMPARISON:  Chest CT 06/21/2018 FINDINGS: Mediastinal blood pool activity: SUV max 2.2 NECK: High-density lesion in the medial RIGHT frontal lobe has hypermetabolic activity above background white matter consistent with brain metastasis. No hypermetabolic lymph nodes in the neck. Incidental CT findings: none CHEST: Large RIGHT upper lobe mass measuring 8 cm is intensely hypermetabolic with SUV max 18. Portions of hypermetabolic central necrosis in the posterolateral  aspect of mass. Enlarged hypermetabolic RIGHT lower paratracheal lymph node. No hypermetabolic supraclavicular nodes Incidental CT findings: Coronary artery calcification and aortic atherosclerotic calcification. ABDOMEN/PELVIS: No abnormal hypermetabolic activity within the liver, pancreas, adrenal glands, or spleen. No hypermetabolic lymph nodes in the abdomen or pelvis. Incidental CT findings: Brachytherapy seeds the prostate gland SKELETON: No focal hypermetabolic activity to suggest skeletal metastasis. Incidental CT findings: Posterior lumbar fusion IMPRESSION: 1. Hypermetabolic RIGHT upper lobe mass consists primary bronchogenic carcinoma. 2. Ipsilateral  hypermetabolic mediastinal nodal metastasis. 3. Metastatic brain lesion in the RIGHT frontal lobe partially imaged. See comparison head CT from 07/13/2018 4. No supraclavicular adenopathy. 5. No evidence of metastatic disease below the diaphragms. Electronically Signed   By: Suzy Bouchard M.D.   On: 07/13/2018 16:14        Scheduled Meds: . amLODipine  5 mg Oral Daily  . cholecalciferol  1,000 Units Oral Daily  . dexamethasone  4 mg Intravenous Q6H  . ferrous sulfate  325 mg Oral Q breakfast  . latanoprost  1 drop Both Eyes QHS  . magnesium oxide  200 mg Oral Daily  . multivitamin with minerals  1 tablet Oral Daily  . thiamine  100 mg Oral Daily  . vitamin B-12  1,000 mcg Oral Daily   Continuous Infusions: . sodium chloride 75 mL/hr at 07/14/18 0400  . sodium chloride Stopped (07/13/18 1807)  . levETIRAcetam 750 mg (07/14/18 0823)     LOS: 1 day        Aline August, MD [facility] Pager [phone]  If 7PM-7AM, please contact night-coverage [email or web address] Password [email or web address] 07/14/2018, 8:41 AM

## 2018-07-14 NOTE — Consult Note (Signed)
Radiation Oncology         [phone] ________________________________  Name: Wyatt Stein MRN: [id number]  Date: 07/13/2018  DOB: August 25, 1934  PP:[id number], Denton Ar, MD  No ref. provider found     REFERRING PHYSICIAN: No ref. provider found   DIAGNOSIS: The primary encounter diagnosis was Brain metastasis ([place]). Diagnoses of Seizure-like activity ([place]), Brain metastases ([place]), and Lung mass were also pertinent to this visit.   HISTORY OF PRESENT ILLNESS::Wyatt Stein Wyatt Stein is a 84 y.o. 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 He patient has a history of seizures dating back approximately 4-5 years according to the patient's wife. He also has a history of prostate cancer with his last PSA level II.48 yesterday. He underwent brachytherapy for this without any known issues.  The patient was noted to have a seizure by his wife who called 911. She describes a tonic-clonic seizure that lasted approximately 20 seconds. The patient has undergone multiple imaging studies since admission. No further seizure activity.  The patient's PET scan showed a hypermetabolic right upper lobe mass consistent with a primary bronchogenic carcinoma. Ipsilateral hypermetabolic mediastinal lymphadenopathy was seen. Additionally, a brain metastasis was also noted on this scan. A brain MRI scan shows a 2.2 cm mass within the right basal ganglia in addition to a additional 0.2 cm right frontal lobe tumor consistent with metastasis as well. Significant edema was present. The patient has been started on Decadron in addition to [place] again with no further seizure activity.  The patient did have a lung biopsy through pulmonary medicine which unfortunately returned without a clear diagnosis. No clear malignancy seen.The patient is being set up for an additional biopsy to obtain further tissue, potentially a CT-guided biopsy to provide  enough information for additional molecular studies to guide medical oncology's decision process.the patient feels well currently and is interested in going home as soon as possible   PREVIOUS RADIATION THERAPY: Yes prostate brachytherapy as noted above, with the patient denying any further radiation treatment   PAST MEDICAL HISTORY:  has a past medical history of Cancer ([place]) (2008), COPD (chronic obstructive pulmonary disease) ([place]), GERD (gastroesophageal reflux disease), History of blood transfusion (2006), Hypertension, and PONV (postoperative nausea and vomiting) (40 yrs ago).     PAST SURGICAL HISTORY: Past Surgical History:  Procedure Laterality Date  . back fracture surgery  Lumbar 2006   screws and , back brace prn  . BACK SURGERY    . COLONOSCOPY WITH PROPOFOL N/A 07/01/2013   Procedure: COLONOSCOPY WITH PROPOFOL;  Surgeon: Garlan Fair, MD;  Location: [facility];  Service: Endoscopy;  Laterality: N/A;  . ESOPHAGOGASTRODUODENOSCOPY (EGD) WITH PROPOFOL N/A 07/01/2013   Procedure: ESOPHAGOGASTRODUODENOSCOPY (EGD) WITH PROPOFOL;  Surgeon: Garlan Fair, MD;  Location: [facility];  Service: Endoscopy;  Laterality: N/A;  . INTRAMEDULLARY (IM) NAIL INTERTROCHANTERIC Left 08/16/2015   Procedure: INTRAMEDULLARY (IM) NAIL INTERTROCHANTRIC;  Surgeon: Mcarthur Rossetti, MD;  Location: [facility];  Service: Orthopedics;  Laterality: Left;  . seed implant for prostate cancer    . TONSILLECTOMY    . VIDEO BRONCHOSCOPY Bilateral 07/02/2018   Procedure: VIDEO BRONCHOSCOPY WITH FLUORO;  Surgeon: Tanda Rockers, MD;  Location: [facility];  Service: Cardiopulmonary;  Laterality: Bilateral;     FAMILY HISTORY: family history includes Colon cancer in his father; Heart Problems in his mother.   SOCIAL HISTORY:  reports that he has been smoking cigarettes. He has a  30.00 pack-year smoking history. He has never used smokeless tobacco. He reports that he does not drink alcohol or use  drugs.   ALLERGIES: Ace inhibitors and Ramipril   MEDICATIONS:  Current Facility-Administered Medications  Medication Dose Route Frequency Provider Last Rate Last Dose  . 0.9 %  sodium chloride infusion   Intravenous Continuous Rai, Ripudeep K, MD 75 mL/hr at 07/14/18 1517    . 0.9 %  sodium chloride infusion   Intravenous Continuous Rai, Ripudeep K, MD   Stopped at 07/13/18 1807  . acetaminophen (TYLENOL) tablet 650 mg  650 mg Oral Q6H PRN Rai, Ripudeep K, MD       Or  . acetaminophen (TYLENOL) suppository 650 mg  650 mg Rectal Q6H PRN Rai, Ripudeep K, MD      . amLODipine (NORVASC) tablet 5 mg  5 mg Oral Daily Rai, Ripudeep K, MD   5 mg at 07/14/18 1001  . benzonatate (TESSALON) capsule 200 mg  200 mg Oral TID PRN Rai, Ripudeep K, MD      . cholecalciferol (VITAMIN D) tablet 1,000 Units  1,000 Units Oral Daily Rai, Ripudeep K, MD   1,000 Units at 07/14/18 1001  . dexamethasone (DECADRON) injection 4 mg  4 mg Intravenous Q6H Rai, Ripudeep K, MD   4 mg at 07/14/18 1135  . latanoprost (XALATAN) 0.005 % ophthalmic solution 1 drop  1 drop Both Eyes QHS Rai, Ripudeep K, MD   1 drop at 07/13/18 2227  . levETIRAcetam (KEPPRA) 750 mg in sodium chloride 0.9 % 100 mL IVPB  750 mg Intravenous Q12H Rai, Vernelle Emerald, MD   Stopped at 07/14/18 6073  . magnesium oxide (MAG-OX) tablet 200 mg  200 mg Oral Daily Rai, Ripudeep K, MD   200 mg at 07/14/18 0959  . multivitamin with minerals tablet 1 tablet  1 tablet Oral Daily Rai, Ripudeep K, MD   1 tablet at 07/14/18 1001  . thiamine (VITAMIN B-1) tablet 100 mg  100 mg Oral Daily Rai, Ripudeep K, MD   100 mg at 07/14/18 1001  . vitamin B-12 (CYANOCOBALAMIN) tablet 1,000 mcg  1,000 mcg Oral Daily Rai, Ripudeep K, MD   1,000 mcg at 07/14/18 1000     REVIEW OF SYSTEMS:  A 15 point review of systems is documented in the electronic medical record. This was obtained by the nursing staff. However, I reviewed this with the patient to discuss relevant findings and make  appropriate changes.  Pertinent items are noted in HPI.    PHYSICAL EXAM:  height is 5\' 8"  (1.727 m) and weight is 130 lb 11.7 oz (59.3 kg). His temperature is 97.3 F (36.3 C) (abnormal). His blood pressure is 134/120 (abnormal) and his pulse is 75. His respiration is 15 and oxygen saturation is 93%.   ECOG = 1  0 - Asymptomatic (Fully active, able to carry on all predisease activities without restriction)  1 - Symptomatic but completely ambulatory (Restricted in physically strenuous activity but ambulatory and able to carry out work of a light or sedentary nature. For example, light housework, office work)  2 - Symptomatic, <50% in bed during the day (Ambulatory and capable of all self care but unable to carry out any work activities. Up and about more than 50% of waking hours)  3 - Symptomatic, >50% in bed, but not bedbound (Capable of only limited self-care, confined to bed or chair 50% or more of waking hours)  4 - Bedbound (Completely disabled. Cannot carry on  any self-care. Totally confined to bed or chair)  5 - Death   Eustace Pen MM, Creech RH, Tormey DC, et al. [phone]). "Toxicity and response criteria of the [facility]". [place] Oncol. 5 (6): 649-55  Alert, no distress, lying in a hospital bed comfortably   LABORATORY DATA:  Lab Results  Component Value Date   WBC 3.2 (L) 07/14/2018   HGB 11.3 (L) 07/14/2018   HCT 32.6 (L) 07/14/2018   MCV 89.6 07/14/2018   PLT 335 07/14/2018   Lab Results  Component Value Date   NA 132 (L) 07/14/2018   K 4.6 07/14/2018   CL 102 07/14/2018   CO2 21 (L) 07/14/2018   Lab Results  Component Value Date   ALT 7 07/13/2018   AST 21 07/13/2018   ALKPHOS 86 07/13/2018   BILITOT 0.8 07/13/2018      RADIOGRAPHY: Dg Chest 2 View  Result Date: 07/13/2018 CLINICAL DATA:  Seizure activity earlier today while sitting outside at home with his wife. 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 EXAM:  CHEST - 2 VIEW COMPARISON:  PET-CT yesterday. CT chest 06/21/2018. Chest x-rays 06/20/2018 and earlier. FINDINGS: Cardiac silhouette normal in size, unchanged. Thoracic aorta mildly tortuous, unchanged. Large RIGHT UPPER LOBE lung mass extending into the RIGHT hilum as noted on the recent examinations. Lungs hyperinflated but clear otherwise. No pleural effusions. IMPRESSION: Large RIGHT UPPER LOBE lung mass extending into the RIGHT hilum as noted previously. No new abnormalities. Electronically Signed   By: Evangeline Dakin M.D.   On: 07/13/2018 16:12   Dg Chest 2 View  Result Date: 06/20/2018 CLINICAL DATA:  Cough EXAM: CHEST - 2 VIEW COMPARISON:  July 25, 2016 FINDINGS: There is an apparent mass arising in the anterior segment right upper lobe measuring 8.6 x 8.4 x 8.5 cm. No other parenchymal lung lesion evident. Lungs are somewhat hyperexpanded. Heart size and pulmonary vascularity are normal. No adenopathy. There is postoperative change in the lower thoracic and lumbar spine regions. There is thoracic lordosis. No blastic or lytic bone lesions are demonstrable. IMPRESSION: Large apparent mass arising from the anterior segment right upper lobe, presumed neoplasm. Advise contrast enhanced chest CT to further assess. Lungs otherwise are clear although hyperexpanded. No adenopathy appreciable. These results will be called to the ordering clinician or representative by the Radiologist Assistant, and communication documented in the PACS or zVision Dashboard. Electronically Signed   By: Lowella Grip III M.D.   On: 06/20/2018 14:56   Ct Head Wo Contrast  Result Date: 07/13/2018 CLINICAL DATA:  New onset seizure EXAM: CT HEAD WITHOUT CONTRAST TECHNIQUE: Contiguous axial images were obtained from the base of the skull through the vertex without intravenous contrast. COMPARISON:  MRI head 09/05/2013, 03/25/2012 FINDINGS: Brain: Hyperdense mass in the right basal ganglia measures 16 x 18 mm with extensive  surrounding edema. Findings most compatible with hemorrhagic metastasis with vasogenic edema. 7 mm of midline shift. No other mass lesions are seen on unenhanced imaging. Moderate atrophy and moderate chronic microvascular ischemic change in the white matter. Vascular: Negative for hyperdense vessel Skull: Negative Sinuses/Orbits: Negative Other: None IMPRESSION: 16 x 18 mm hyperdense mass right basal ganglia with extensive surrounding edema and 7 mm midline shift. Findings most compatible with hemorrhagic metastatic disease. Patient has a large lung mass which is hypermetabolic on PET scan yesterday compatible with lung carcinoma. Moderate atrophy and moderate chronic microvascular ischemia These results were called by telephone at the time of  interpretation on 07/13/2018 at 4:03 pm to Dr. Nanda Quinton , who verbally acknowledged these results. Electronically Signed   By: Franchot Gallo M.D.   On: 07/13/2018 16:04   Ct Chest W Contrast  Result Date: 06/21/2018 CLINICAL DATA:  RIGHT upper lobe mass, abnormal chest radiograph EXAM: CT CHEST WITH CONTRAST TECHNIQUE: Multidetector CT imaging of the chest was performed during intravenous contrast administration. Sagittal and coronal MPR images reconstructed from axial data set. CONTRAST:  87mL ISOVUE-300 IOPAMIDOL (ISOVUE-300) INJECTION 61% IV COMPARISON:  Chest radiograph 06/20/2018 FINDINGS: Cardiovascular: Atherosclerotic calcifications aorta and coronary arteries. Aorta normal caliber. Pulmonary arteries grossly patent on non targeted exam. No pericardial effusion. Mediastinum/Nodes: Esophagus and visualized base of cervical region normal appearance. Enlarged RIGHT paratracheal lymph node 13 mm short axis image 58 and 19 mm short axis image 63. Necrotic mediastinal lymph node 16 mm short axis image 65. Lungs/Pleura: Large macrolobulated RIGHT upper lobe mass measuring 9.6 x 5.5 x 8.4 cm in size consistent with a primary pulmonary neoplasm. Mass demonstrates large  areas of central necrosis. Mass abuts the anterior chest wall over a broad surface and also abuts the mediastinum over at least 5.6 cm length. Mass extends to the superior aspect of the RIGHT pulmonary hilum. Postobstructive pneumonitis versus lymphangitic tumor spread peripheral to the mass in the RIGHT upper lobe. Underlying panlobular emphysema. RIGHT upper lobe scarring. Tiny foci of ground-glass opacity are seen in both lower lobes, nonspecific. Minimal dependent pleural effusion at the inferior RIGHT lung base. Calcified granuloma LEFT lower lobe image 87. No additional pulmonary mass/nodule. Upper Abdomen: Questionable 12 mm RIGHT adrenal nodule versus nonspecific thickening. Remaining visualized upper abdomen unremarkable. Musculoskeletal: Prior thoracolumbar spinal fixation with old compression fractures of multiple vertebra. No discrete bone lesions. IMPRESSION: Large RIGHT upper lobe mass 9.6 x 5.5 x 8.4 cm in size consistent with a primary pulmonary neoplasm. Mass abuts the anterior chest wall, mediastinum and superior RIGHT hilum and demonstrates central necrosis. Postobstructive pneumonitis versus lymphangitic tumor spread in RIGHT upper lobe. Underlying pan lobular emphysema. Tiny scattered nonspecific ground-glass opacities in the lower lobes. Mediastinal adenopathy as above. Aortic Atherosclerosis (ICD10-I70.0) and Emphysema (ICD10-J43.9). Electronically Signed   By: Lavonia Dana M.D.   On: 06/21/2018 18:56   Mr Jeri Cos UR Contrast  Result Date: 07/13/2018 CLINICAL DATA:  Metastatic lung cancer staging EXAM: MRI HEAD WITHOUT AND WITH CONTRAST TECHNIQUE: Multiplanar, multiecho pulse sequences of the brain and surrounding structures were obtained without and with intravenous contrast. CONTRAST:  22mL MULTIHANCE GADOBENATE DIMEGLUMINE 529 MG/ML IV SOLN COMPARISON:  CT head 07/13/2018 FINDINGS: Brain: Enhancing mass lesion in the right basal ganglia measures 22 x 17 x 21 mm. The mass has irregular  enhancement with irregular contour to the lesion. Large amount of surrounding edema. Lesion is hyperdense on CT suggesting hemorrhage however MRI does not confirm hemorrhage in the lesion. 2 mm enhancing nodule in the right frontal lobe within the white matter edema. This is consistent with a second brain metastasis. No other enhancing nodules identified. Extensive edema surrounding the right basal ganglia lesion with mass-effect on the lateral ventricle. 7 mm midline shift to the left. Generalized atrophy. Chronic microvascular ischemic changes in the white matter. Negative for acute infarct. Vascular: Normal arterial flow voids Skull and upper cervical spine: Negative Sinuses/Orbits: Negative Other: None IMPRESSION: Enhancing mass lesion right basal ganglia with extensive surrounding edema. Given history of lung cancer this is consistent with metastatic disease. Second 2 mm enhancing lesion in the right frontal lobe  also consistent with metastatic disease. There is mass-effect and 7 mm midline shift to the left due to extensive edema. Electronically Signed   By: Franchot Gallo M.D.   On: 07/13/2018 20:18   Nm Pet Image Initial (pi) Skull Base To Thigh  Result Date: 07/13/2018 CLINICAL DATA:  Initial treatment strategy for lung mass. EXAM: NUCLEAR MEDICINE PET SKULL BASE TO THIGH TECHNIQUE: 6.7 mCi F-18 FDG was injected intravenously. Full-ring PET imaging was performed from the skull base to thigh after the radiotracer. CT data was obtained and used for attenuation correction and anatomic localization. Fasting blood glucose: 99 mg/dl COMPARISON:  Chest CT 06/21/2018 FINDINGS: Mediastinal blood pool activity: SUV max 2.2 NECK: High-density lesion in the medial RIGHT frontal lobe has hypermetabolic activity above background white matter consistent with brain metastasis. No hypermetabolic lymph nodes in the neck. Incidental CT findings: none CHEST: Large RIGHT upper lobe mass measuring 8 cm is intensely  hypermetabolic with SUV max 18. Portions of hypermetabolic central necrosis in the posterolateral aspect of mass. Enlarged hypermetabolic RIGHT lower paratracheal lymph node. No hypermetabolic supraclavicular nodes Incidental CT findings: Coronary artery calcification and aortic atherosclerotic calcification. ABDOMEN/PELVIS: No abnormal hypermetabolic activity within the liver, pancreas, adrenal glands, or spleen. No hypermetabolic lymph nodes in the abdomen or pelvis. Incidental CT findings: Brachytherapy seeds the prostate gland SKELETON: No focal hypermetabolic activity to suggest skeletal metastasis. Incidental CT findings: Posterior lumbar fusion IMPRESSION: 1. Hypermetabolic RIGHT upper lobe mass consists primary bronchogenic carcinoma. 2. Ipsilateral hypermetabolic mediastinal nodal metastasis. 3. Metastatic brain lesion in the RIGHT frontal lobe partially imaged. See comparison head CT from 07/13/2018 4. No supraclavicular adenopathy. 5. No evidence of metastatic disease below the diaphragms. Electronically Signed   By: Suzy Bouchard M.D.   On: 07/13/2018 16:14       IMPRESSION:  The patient presented with seizure activity and his workup is consistent with metastatic lung cancer with brain metastasis. A second biopsy is pending with the first providing nondiagnostic material for malignancy. Hopefully this will be accomplished as soon as possible next week. The patient has a hypermetabolic right lung tumor with mediastinal metastasis in addition to 2 brain metastases.   PLAN:  -continue Decadron/Keppra -second biopsy of the lung as soon as possible next week -I discussed radiosurgery for the patient's intracranial metastasis. Dr. Vertell Limber has reviewed his imaging and the patient does not appear to be a good candidate for surgery.Through our brain tumor program, we will coordinate an anticipated course of radiosurgery as an outpatient. The patient can continue his current dose of steroids and we  will further taper this as an outpatient. -The patient appears to be a good candidate for palliative radiation treatment to the lungs as well. We will further discuss this with the patient and coordinate this with medical oncology.   ________________________________   Jodelle Gross, MD, PhD   **Disclaimer: This note was dictated with voice recognition software. Similar sounding words can inadvertently be transcribed and this note may contain transcription errors which may not have been corrected upon publication of note.**

## 2018-07-15 LAB — COMPREHENSIVE METABOLIC PANEL
ALBUMIN: 3 g/dL — AB (ref 3.5–5.0)
ALT: 9 U/L (ref 0–44)
ANION GAP: 9 (ref 5–15)
AST: 17 U/L (ref 15–41)
Alkaline Phosphatase: 70 U/L (ref 38–126)
BUN: 30 mg/dL — ABNORMAL HIGH (ref 8–23)
CO2: 22 mmol/L (ref 22–32)
Calcium: 9.2 mg/dL (ref 8.9–10.3)
Chloride: 102 mmol/L (ref 98–111)
Creatinine, Ser: 1.44 mg/dL — ABNORMAL HIGH (ref 0.61–1.24)
GFR calc Af Amer: 51 mL/min — ABNORMAL LOW (ref >60)
GFR calc non Af Amer: 44 mL/min — ABNORMAL LOW (ref >60)
GLUCOSE: 119 mg/dL — AB (ref 70–99)
POTASSIUM: 4.4 mmol/L (ref 3.5–5.1)
SODIUM: 133 mmol/L — AB (ref 135–145)
Total Bilirubin: 0.5 mg/dL (ref 0.3–1.2)
Total Protein: 6.4 g/dL — ABNORMAL LOW (ref 6.5–8.1)

## 2018-07-15 LAB — URINE CULTURE: Culture: <10000 — AB

## 2018-07-15 LAB — CBC WITH DIFFERENTIAL/PLATELET
Basophils Absolute: 0 10*3/uL (ref 0.0–0.1)
Basophils Relative: 0 %
EOS ABS: 0 10*3/uL (ref 0.0–0.7)
EOS PCT: 0 %
HCT: 34.8 % — ABNORMAL LOW (ref 39.0–52.0)
Hemoglobin: 12 g/dL — ABNORMAL LOW (ref 13.0–17.0)
Lymphocytes Relative: 6 %
Lymphs Abs: 0.6 10*3/uL — ABNORMAL LOW (ref 0.7–4.0)
MCH: 30.8 pg (ref 26.0–34.0)
MCHC: 34.5 g/dL (ref 30.0–36.0)
MCV: 89.2 fL (ref 78.0–100.0)
MONO ABS: 0.3 10*3/uL (ref 0.1–1.0)
MONOS PCT: 3 %
Neutro Abs: 8.7 10*3/uL — ABNORMAL HIGH (ref 1.7–7.7)
Neutrophils Relative %: 91 %
PLATELETS: 319 10*3/uL (ref 150–400)
RBC: 3.9 MIL/uL — ABNORMAL LOW (ref 4.22–5.81)
RDW: 13.4 % (ref 11.5–15.5)
WBC: 9.5 10*3/uL (ref 4.0–10.5)

## 2018-07-15 LAB — MAGNESIUM: Magnesium: 1.5 mg/dL — ABNORMAL LOW (ref 1.7–2.4)

## 2018-07-15 MED ORDER — LEVETIRACETAM 500 MG PO TABS
750.0000 mg | ORAL_TABLET | Freq: Two times a day (BID) | ORAL | Status: DC
  Administered 2018-07-15 – 2018-07-16 (×2): 750 mg via ORAL
  Filled 2018-07-15 (×2): qty 1

## 2018-07-15 MED ORDER — MAGNESIUM SULFATE 2 GM/50ML IV SOLN
2.0000 g | Freq: Once | INTRAVENOUS | Status: AC
  Administered 2018-07-15: 2 g via INTRAVENOUS
  Filled 2018-07-15: qty 50

## 2018-07-15 NOTE — Progress Notes (Signed)
Wyatt Stein   DOB:[birth year]   TI#:[id number]    Assessment & Plan:   Stage IV lung cancer until proven otherwise I have discussed this with pulmonologist For now, it appears that the next plan of care would involve a CT-guided biopsy of the lung mass Without specific subtype of disease or molecular studies, I would not be able to offer specific recommendation for chemotherapy He is advised to quit smoking permanently. Awaiting plan from IR. 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 Lisbeth Renshaw, radiation oncologist Appreciate recommendations, planned for outpatient treatment In the meantime, should he will continue high-dose Keppra with dexamethasone I have consulted PT eval; if he is safe to go home, no need SNF placement  Acute on chronic renal failure, improving/stable Likely secondary to dehydration Serum creatinine has improved  Goals of care The plan would be for him to get started with radiation therapy as soon as possible In the meantime, CT-guided lung biopsy is being arranged for further diagnostic purposes  Discharge planning If the patient remains stable, he can probably be discharged and resume the rest of his treatment as an outpatient. I will arrange follow-up  [name], MD 07/15/2018  6:48 AM   Subjective:  He feels well. No further recurrent seizures. No perceived additional weakness  Objective:  Vitals:   07/14/18 2047 07/15/18 0437  BP: 131/73 132/64  Pulse: (!) 58 (!) 51  Resp: 16 16  Temp: 98.5 F (36.9 C) (!) 97.5 F (36.4 C)  SpO2: 99% 96%     Intake/Output Summary (Last 24 hours) at 07/15/2018 8676 Last data filed at 07/15/2018 1950 Gross per 24 hour  Intake 1706.28 ml  Output 401 ml  Net 1305.28 ml    GENERAL:alert, no distress and comfortable SKIN: skin color, texture, turgor are  normal, no rashes or significant lesions EYES: normal, Conjunctiva are pink and non-injected, sclera clear OROPHARYNX:no exudate, no erythema and lips, buccal mucosa, and tongue normal  NECK: supple, thyroid normal size, non-tender, without nodularity LYMPH:  no palpable lymphadenopathy in the cervical, axillary or inguinal LUNGS: clear to auscultation and percussion with normal breathing effort HEART: regular rate & rhythm and no murmurs and no lower extremity edema ABDOMEN:abdomen soft, non-tender and normal bowel sounds Musculoskeletal:no cyanosis of digits and no clubbing  NEURO: alert & oriented x 3 with fluent speech, no focal motor/sensory deficits   Labs:  Lab Results  Component Value Date   WBC 9.5 07/15/2018   HGB 12.0 (L) 07/15/2018   HCT 34.8 (L) 07/15/2018   MCV 89.2 07/15/2018   PLT 319 07/15/2018   NEUTROABS 8.7 (H) 07/15/2018    Lab Results  Component Value Date   NA 133 (L) 07/15/2018   K 4.4 07/15/2018   CL 102 07/15/2018   CO2 22 07/15/2018    Studies:  Dg Chest 2 View  Result Date: 07/13/2018 CLINICAL DATA:  Seizure activity earlier today while sitting outside at home with his wife. 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 EXAM: CHEST - 2 VIEW COMPARISON:  PET-CT yesterday. CT chest 06/21/2018. Chest x-rays 06/20/2018 and earlier. FINDINGS: Cardiac silhouette normal in size, unchanged. Thoracic aorta mildly tortuous, unchanged. Large RIGHT UPPER LOBE lung mass extending into the RIGHT hilum as noted on the recent examinations. Lungs hyperinflated but clear otherwise. No pleural effusions. IMPRESSION: Large RIGHT UPPER LOBE lung  mass extending into the RIGHT hilum as noted previously. No new abnormalities. Electronically Signed   By: Evangeline Dakin M.D.   On: 07/13/2018 16:12   Ct Head Wo Contrast  Result Date: 07/13/2018 CLINICAL DATA:  New onset seizure EXAM: CT HEAD WITHOUT CONTRAST TECHNIQUE: Contiguous axial images were obtained  from the base of the skull through the vertex without intravenous contrast. COMPARISON:  MRI head 09/05/2013, 03/25/2012 FINDINGS: Brain: Hyperdense mass in the right basal ganglia measures 16 x 18 mm with extensive surrounding edema. Findings most compatible with hemorrhagic metastasis with vasogenic edema. 7 mm of midline shift. No other mass lesions are seen on unenhanced imaging. Moderate atrophy and moderate chronic microvascular ischemic change in the white matter. Vascular: Negative for hyperdense vessel Skull: Negative Sinuses/Orbits: Negative Other: None IMPRESSION: 16 x 18 mm hyperdense mass right basal ganglia with extensive surrounding edema and 7 mm midline shift. Findings most compatible with hemorrhagic metastatic disease. Patient has a large lung mass which is hypermetabolic on PET scan yesterday compatible with lung carcinoma. Moderate atrophy and moderate chronic microvascular ischemia These results were called by telephone at the time of interpretation on 07/13/2018 at 4:03 pm to Dr. Nanda Quinton , who verbally acknowledged these results. Electronically Signed   By: Franchot Gallo M.D.   On: 07/13/2018 16:04   Mr Jeri Cos WF Contrast  Result Date: 07/13/2018 CLINICAL DATA:  Metastatic lung cancer staging EXAM: MRI HEAD WITHOUT AND WITH CONTRAST TECHNIQUE: Multiplanar, multiecho pulse sequences of the brain and surrounding structures were obtained without and with intravenous contrast. CONTRAST:  75mL MULTIHANCE GADOBENATE DIMEGLUMINE 529 MG/ML IV SOLN COMPARISON:  CT head 07/13/2018 FINDINGS: Brain: Enhancing mass lesion in the right basal ganglia measures 22 x 17 x 21 mm. The mass has irregular enhancement with irregular contour to the lesion. Large amount of surrounding edema. Lesion is hyperdense on CT suggesting hemorrhage however MRI does not confirm hemorrhage in the lesion. 2 mm enhancing nodule in the right frontal lobe within the white matter edema. This is consistent with a second brain  metastasis. No other enhancing nodules identified. Extensive edema surrounding the right basal ganglia lesion with mass-effect on the lateral ventricle. 7 mm midline shift to the left. Generalized atrophy. Chronic microvascular ischemic changes in the white matter. Negative for acute infarct. Vascular: Normal arterial flow voids Skull and upper cervical spine: Negative Sinuses/Orbits: Negative Other: None IMPRESSION: Enhancing mass lesion right basal ganglia with extensive surrounding edema. Given history of lung cancer this is consistent with metastatic disease. Second 2 mm enhancing lesion in the right frontal lobe also consistent with metastatic disease. There is mass-effect and 7 mm midline shift to the left due to extensive edema. Electronically Signed   By: Franchot Gallo M.D.   On: 07/13/2018 20:18

## 2018-07-15 NOTE — Progress Notes (Signed)
Chief Complaint: Patient was seen in consultation today for lung mass biopsy at the request of Dr. Aline August  Referring Physician(s): Dr. Aline August  Supervising Physician: Jacqulynn Cadet  Patient Status: [facility] - In-pt  History of Present Illness: Wyatt Stein is a 84 y.o. male with large right lung mass. He underwent bronchoscopy but biopsies were non-diagnostic. He has also had PET scan and has now been admitted with mild seizure activity secondary to his brain mets. He's had no further activity since admission. IR is asked to perc biopsy the lung lesions for tissue diagnosis. PMHx, meds, labs, imaging, allergies reviewed. Feels well, no recent fevers, chills, illness. Family at bedside.   Past Medical History:  Diagnosis Date  . Cancer [facility]) 2008   prostate, sees dr [name] once a year  . COPD (chronic obstructive pulmonary disease) ([place])   . GERD (gastroesophageal reflux disease)   . History of blood transfusion 2006  . Hypertension   . PONV (postoperative nausea and vomiting) 40 yrs ago   aspiration pneumonia after tooth extraxtion    Past Surgical History:  Procedure Laterality Date  . back fracture surgery  Lumbar 2006   screws and , back brace prn  . BACK SURGERY    . COLONOSCOPY WITH PROPOFOL N/A 07/01/2013   Procedure: COLONOSCOPY WITH PROPOFOL;  Surgeon: Garlan Fair, MD;  Location: [facility] ENDOSCOPY;  Service: Endoscopy;  Laterality: N/A;  . ESOPHAGOGASTRODUODENOSCOPY (EGD) WITH PROPOFOL N/A 07/01/2013   Procedure: ESOPHAGOGASTRODUODENOSCOPY (EGD) WITH PROPOFOL;  Surgeon: Garlan Fair, MD;  Location: [facility] ENDOSCOPY;  Service: Endoscopy;  Laterality: N/A;  . INTRAMEDULLARY (IM) NAIL INTERTROCHANTERIC Left 08/16/2015   Procedure: INTRAMEDULLARY (IM) NAIL INTERTROCHANTRIC;  Surgeon: Mcarthur Rossetti, MD;  Location: [facility] ORS;  Service: Orthopedics;  Laterality: Left;  . seed implant for prostate cancer    . TONSILLECTOMY    . VIDEO BRONCHOSCOPY  Bilateral 07/02/2018   Procedure: VIDEO BRONCHOSCOPY WITH FLUORO;  Surgeon: Tanda Rockers, MD;  Location: [facility] ENDOSCOPY;  Service: Cardiopulmonary;  Laterality: Bilateral;    Allergies: Ace inhibitors and Ramipril  Medications: Prior to Admission medications   Medication Sig Start Date End Date Taking? Authorizing Provider  amLODipine (NORVASC) 5 MG tablet Take 5 mg by mouth daily. 01/18/18  Yes [provider]  benzonatate (TESSALON) 200 MG capsule Take 1 capsule by mouth 3 (three) times daily as needed for cough.  06/19/18  Yes [provider]  cholecalciferol (VITAMIN D) 1000 UNITS tablet Take 1,000 Units by mouth daily.   Yes [provider]  [employer] B-1 100 MG tablet Take 100 mg by mouth daily. 07/17/15  Yes [provider]  ferrous sulfate 325 (65 FE) MG tablet Take 325 mg by mouth daily with breakfast.   Yes [provider]  hydrochlorothiazide (HYDRODIURIL) 12.5 MG tablet Take 12.5 mg by mouth every morning. 01/17/18  Yes [provider]  latanoprost (XALATAN) 0.005 % ophthalmic solution Place 1 drop into both eyes at bedtime.  10/20/15  Yes [provider]  Magnesium 250 MG TABS Take 250 mg by mouth daily.    Yes [provider]  Multiple Vitamins-Minerals (CENTRUM SILVER ADULT 50+) TABS Take 1 tablet by mouth daily.   Yes [provider]  vitamin B-12 (CYANOCOBALAMIN) 1000 MCG tablet Take 1,000 mcg by mouth daily.   Yes [provider]     Family History  Problem Relation Age of Onset  . Heart Problems Mother   . Colon cancer Father  Social History   Socioeconomic History  . Marital status: Married    Spouse name: [name]   . Number of children: 2  . Years of education: College   . Highest education level: Not on file  Occupational History  . Occupation: Retired   Scientific laboratory technician  . Financial resource strain: Not on file  . Food insecurity:    Worry: Not on file    Inability: Not on file    . Transportation needs:    Medical: Not on file    Non-medical: Not on file  Tobacco Use  . Smoking status: Current Every Day Smoker    Packs/day: 0.50    Years: 60.00    Pack years: 30.00    Types: Cigarettes  . Smokeless tobacco: Never Used  Substance and Sexual Activity  . Alcohol use: No    Alcohol/week: 14.0 standard drinks    Types: 14 Shots of liquor per week    Comment: Former--pt states none since Oct  . Drug use: No  . Sexual activity: Yes    Birth control/protection: None  Lifestyle  . Physical activity:    Days per week: Not on file    Minutes per session: Not on file  . Stress: Not on file  Relationships  . Social connections:    Talks on phone: Not on file    Gets together: Not on file    Attends religious service: Not on file    Active member of club or organization: Not on file    Attends meetings of clubs or organizations: Not on file    Relationship status: Not on file  Other Topics Concern  . Not on file  Social History Narrative   Patient lives at home with wife.   Patient is retired.    Patient is a college grad.    Patient has 2 children.     Review of Systems: A 12 point ROS discussed and pertinent positives are indicated in the HPI above.  All other systems are negative.  Review of Systems  Vital Signs: BP 132/64 (BP Location: Right Arm)   Pulse (!) 51   Temp (!) 97.5 F (36.4 C) (Oral)   Resp 16   Ht 5\' 8"  (1.727 m)   Wt 59.3 kg   SpO2 96%   BMI 19.88 kg/m   Physical Exam  Constitutional: He is oriented to person, place, and time. He appears well-developed and well-nourished. No distress.  HENT:  Head: Normocephalic.  Mouth/Throat: Oropharynx is clear and moist.  Neck: Normal range of motion. No JVD present.  Cardiovascular: Normal rate, regular rhythm and normal heart sounds.  Pulmonary/Chest: Effort normal and breath sounds normal.  Neurological: He is alert and oriented to person, place, and time. No cranial nerve deficit.   Skin: Skin is warm.  Psychiatric: He has a normal mood and affect.    Imaging: Dg Chest 2 View  Result Date: 07/13/2018 CLINICAL DATA:  Seizure activity earlier today while sitting outside at home with his wife. 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 EXAM: CHEST - 2 VIEW COMPARISON:  PET-CT yesterday. CT chest 06/21/2018. Chest x-rays 06/20/2018 and earlier. FINDINGS: Cardiac silhouette normal in size, unchanged. Thoracic aorta mildly tortuous, unchanged. Large RIGHT UPPER LOBE lung mass extending into the RIGHT hilum as noted on the recent examinations. Lungs hyperinflated but clear otherwise. No pleural effusions. IMPRESSION: Large RIGHT UPPER LOBE lung mass extending into the RIGHT hilum as noted previously. No new abnormalities. Electronically  Signed   By: Evangeline Dakin M.D.   On: 07/13/2018 16:12   Dg Chest 2 View  Result Date: 06/20/2018 CLINICAL DATA:  Cough EXAM: CHEST - 2 VIEW COMPARISON:  July 25, 2016 FINDINGS: There is an apparent mass arising in the anterior segment right upper lobe measuring 8.6 x 8.4 x 8.5 cm. No other parenchymal lung lesion evident. Lungs are somewhat hyperexpanded. Heart size and pulmonary vascularity are normal. No adenopathy. There is postoperative change in the lower thoracic and lumbar spine regions. There is thoracic lordosis. No blastic or lytic bone lesions are demonstrable. IMPRESSION: Large apparent mass arising from the anterior segment right upper lobe, presumed neoplasm. Advise contrast enhanced chest CT to further assess. Lungs otherwise are clear although hyperexpanded. No adenopathy appreciable. These results will be called to the ordering clinician or representative by the Radiologist Assistant, and communication documented in the PACS or zVision Dashboard. Electronically Signed   By: Lowella Grip III M.D.   On: 06/20/2018 14:56   Ct Head Wo Contrast  Result Date: 07/13/2018 CLINICAL DATA:  New onset seizure  EXAM: CT HEAD WITHOUT CONTRAST TECHNIQUE: Contiguous axial images were obtained from the base of the skull through the vertex without intravenous contrast. COMPARISON:  MRI head 09/05/2013, 03/25/2012 FINDINGS: Brain: Hyperdense mass in the right basal ganglia measures 16 x 18 mm with extensive surrounding edema. Findings most compatible with hemorrhagic metastasis with vasogenic edema. 7 mm of midline shift. No other mass lesions are seen on unenhanced imaging. Moderate atrophy and moderate chronic microvascular ischemic change in the white matter. Vascular: Negative for hyperdense vessel Skull: Negative Sinuses/Orbits: Negative Other: None IMPRESSION: 16 x 18 mm hyperdense mass right basal ganglia with extensive surrounding edema and 7 mm midline shift. Findings most compatible with hemorrhagic metastatic disease. Patient has a large lung mass which is hypermetabolic on PET scan yesterday compatible with lung carcinoma. Moderate atrophy and moderate chronic microvascular ischemia These results were called by telephone at the time of interpretation on 07/13/2018 at 4:03 pm to Dr. Nanda Quinton , who verbally acknowledged these results. Electronically Signed   By: Franchot Gallo M.D.   On: 07/13/2018 16:04   Ct Chest W Contrast  Result Date: 06/21/2018 CLINICAL DATA:  RIGHT upper lobe mass, abnormal chest radiograph EXAM: CT CHEST WITH CONTRAST TECHNIQUE: Multidetector CT imaging of the chest was performed during intravenous contrast administration. Sagittal and coronal MPR images reconstructed from axial data set. CONTRAST:  73mL ISOVUE-300 IOPAMIDOL (ISOVUE-300) INJECTION 61% IV COMPARISON:  Chest radiograph 06/20/2018 FINDINGS: Cardiovascular: Atherosclerotic calcifications aorta and coronary arteries. Aorta normal caliber. Pulmonary arteries grossly patent on non targeted exam. No pericardial effusion. Mediastinum/Nodes: Esophagus and visualized base of cervical region normal appearance. Enlarged RIGHT  paratracheal lymph node 13 mm short axis image 58 and 19 mm short axis image 63. Necrotic mediastinal lymph node 16 mm short axis image 65. Lungs/Pleura: Large macrolobulated RIGHT upper lobe mass measuring 9.6 x 5.5 x 8.4 cm in size consistent with a primary pulmonary neoplasm. Mass demonstrates large areas of central necrosis. Mass abuts the anterior chest wall over a broad surface and also abuts the mediastinum over at least 5.6 cm length. Mass extends to the superior aspect of the RIGHT pulmonary hilum. Postobstructive pneumonitis versus lymphangitic tumor spread peripheral to the mass in the RIGHT upper lobe. Underlying panlobular emphysema. RIGHT upper lobe scarring. Tiny foci of ground-glass opacity are seen in both lower lobes, nonspecific. Minimal dependent pleural effusion at the inferior RIGHT lung base. Calcified  granuloma LEFT lower lobe image 87. No additional pulmonary mass/nodule. Upper Abdomen: Questionable 12 mm RIGHT adrenal nodule versus nonspecific thickening. Remaining visualized upper abdomen unremarkable. Musculoskeletal: Prior thoracolumbar spinal fixation with old compression fractures of multiple vertebra. No discrete bone lesions. IMPRESSION: Large RIGHT upper lobe mass 9.6 x 5.5 x 8.4 cm in size consistent with a primary pulmonary neoplasm. Mass abuts the anterior chest wall, mediastinum and superior RIGHT hilum and demonstrates central necrosis. Postobstructive pneumonitis versus lymphangitic tumor spread in RIGHT upper lobe. Underlying pan lobular emphysema. Tiny scattered nonspecific ground-glass opacities in the lower lobes. Mediastinal adenopathy as above. Aortic Atherosclerosis (ICD10-I70.0) and Emphysema (ICD10-J43.9). Electronically Signed   By: Lavonia Dana M.D.   On: 06/21/2018 18:56   Mr Jeri Cos AT Contrast  Result Date: 07/13/2018 CLINICAL DATA:  Metastatic lung cancer staging EXAM: MRI HEAD WITHOUT AND WITH CONTRAST TECHNIQUE: Multiplanar, multiecho pulse sequences of the  brain and surrounding structures were obtained without and with intravenous contrast. CONTRAST:  9mL MULTIHANCE GADOBENATE DIMEGLUMINE 529 MG/ML IV SOLN COMPARISON:  CT head 07/13/2018 FINDINGS: Brain: Enhancing mass lesion in the right basal ganglia measures 22 x 17 x 21 mm. The mass has irregular enhancement with irregular contour to the lesion. Large amount of surrounding edema. Lesion is hyperdense on CT suggesting hemorrhage however MRI does not confirm hemorrhage in the lesion. 2 mm enhancing nodule in the right frontal lobe within the white matter edema. This is consistent with a second brain metastasis. No other enhancing nodules identified. Extensive edema surrounding the right basal ganglia lesion with mass-effect on the lateral ventricle. 7 mm midline shift to the left. Generalized atrophy. Chronic microvascular ischemic changes in the white matter. Negative for acute infarct. Vascular: Normal arterial flow voids Skull and upper cervical spine: Negative Sinuses/Orbits: Negative Other: None IMPRESSION: Enhancing mass lesion right basal ganglia with extensive surrounding edema. Given history of lung cancer this is consistent with metastatic disease. Second 2 mm enhancing lesion in the right frontal lobe also consistent with metastatic disease. There is mass-effect and 7 mm midline shift to the left due to extensive edema. Electronically Signed   By: Franchot Gallo M.D.   On: 07/13/2018 20:18   Nm Pet Image Initial (pi) Skull Base To Thigh  Result Date: 07/13/2018 CLINICAL DATA:  Initial treatment strategy for lung mass. EXAM: NUCLEAR MEDICINE PET SKULL BASE TO THIGH TECHNIQUE: 6.7 mCi F-18 FDG was injected intravenously. Full-ring PET imaging was performed from the skull base to thigh after the radiotracer. CT data was obtained and used for attenuation correction and anatomic localization. Fasting blood glucose: 99 mg/dl COMPARISON:  Chest CT 06/21/2018 FINDINGS: Mediastinal blood pool activity: SUV  max 2.2 NECK: High-density lesion in the medial RIGHT frontal lobe has hypermetabolic activity above background white matter consistent with brain metastasis. No hypermetabolic lymph nodes in the neck. Incidental CT findings: none CHEST: Large RIGHT upper lobe mass measuring 8 cm is intensely hypermetabolic with SUV max 18. Portions of hypermetabolic central necrosis in the posterolateral aspect of mass. Enlarged hypermetabolic RIGHT lower paratracheal lymph node. No hypermetabolic supraclavicular nodes Incidental CT findings: Coronary artery calcification and aortic atherosclerotic calcification. ABDOMEN/PELVIS: No abnormal hypermetabolic activity within the liver, pancreas, adrenal glands, or spleen. No hypermetabolic lymph nodes in the abdomen or pelvis. Incidental CT findings: Brachytherapy seeds the prostate gland SKELETON: No focal hypermetabolic activity to suggest skeletal metastasis. Incidental CT findings: Posterior lumbar fusion IMPRESSION: 1. Hypermetabolic RIGHT upper lobe mass consists primary bronchogenic carcinoma. 2. Ipsilateral hypermetabolic mediastinal nodal  metastasis. 3. Metastatic brain lesion in the RIGHT frontal lobe partially imaged. See comparison head CT from 07/13/2018 4. No supraclavicular adenopathy. 5. No evidence of metastatic disease below the diaphragms. Electronically Signed   By: Suzy Bouchard M.D.   On: 07/13/2018 16:14    Labs:  CBC: Recent Labs    07/13/18 1520 07/14/18 0403 07/15/18 0426  WBC 6.6 3.2* 9.5  HGB 12.9* 11.3* 12.0*  HCT 36.6* 32.6* 34.8*  PLT 297 335 319    COAGS: No results for input(s): INR, APTT in the last 8760 hours.  BMP: Recent Labs    07/13/18 1520 07/14/18 0403 07/15/18 0426  NA 129* 132* 133*  K 4.9 4.6 4.4  CL 95* 102 102  CO2 22 21* 22  GLUCOSE 119* 163* 119*  BUN 33* 29* 30*  CALCIUM 8.7* 9.0 9.2  CREATININE 1.70* 1.33* 1.44*  GFRNONAA 36* 48* 44*  GFRAA 41* 56* 51*    LIVER FUNCTION TESTS: Recent Labs     07/13/18 1520 07/15/18 0426  BILITOT 0.8 0.5  AST 21 17  ALT 7 9  ALKPHOS 86 70  PROT 7.1 6.4*  ALBUMIN 3.3* 3.0*    TUMOR MARKERS: No results for input(s): AFPTM, CEA, CA199, CHROMGRNA in the last 8760 hours.  Assessment and Plan: Large RUL lung mass Reviewed imaging with Dr. Laurence Ferrari. Amenable to perc biopsy. May also have low yield from necrotic tumor. Labs ok Risks and benefits discussed with the patient including, but not limited to bleeding, hemoptysis, respiratory failure requiring intubation, infection, pneumothorax requiring chest tube placement, stroke from air embolism or even death.  All of the patient's questions were answered, patient is agreeable to proceed. Consent signed and in chart.   Thank you for this interesting consult.  I greatly enjoyed meeting BEXTON HAAK and look forward to participating in their care.  A copy of this report was sent to the requesting provider on this date.  Electronically Signed: Ascencion Dike, PA-C 07/15/2018, 12:17 PM   I spent a total of 20 minutes in face to face in clinical consultation, greater than 50% of which was counseling/coordinating care for lung mass biopsy

## 2018-07-15 NOTE — Evaluation (Signed)
Physical Therapy Evaluation Patient Details Name: Wyatt Stein MRN: [id number] DOB: 1934-05-18 Today's Date: 07/15/2018   History of Present Illness  84 year old male with history of COPD, hypertension, GERD, prior prostate cancer in 2008, prior hx of szs, L hip IM nail, syncope, extensive remote back surgery (per pt), recently dx R upper lung mass--> underwent bronchoscopy on 07/02/2018,  biopsy nondiagnostic for lung cancer, PET scan done on 07/12/2018 showed right upper lobe mass consistent with bronchogenic carcinoma, ipsilateral mediastinal nodal metastasis, metastatic brain lesion in the right frontal lobe presented on 07/13/2018 with seizure.   Clinical Impression  Pt reports he is independent at his baseline, today he presents with weakness, unsteady gait and diminished cognition; IF pt family   able to provide 24hr care (d/t cognition/safety issues) pt should be able to d/c home with HHPT, if not recommend SNF, will continue to follow in acute setting     Follow Up Recommendations Home health PT;Supervision/Assistance - 24 hour    Equipment Recommendations  Other (comment)(TBD-may need RW)    Recommendations for Other Services       Precautions / Restrictions Precautions Precautions: Fall Restrictions Weight Bearing Restrictions: No      Mobility  Bed Mobility Overal bed mobility: Needs Assistance Bed Mobility: Supine to Sit     Supine to sit: Min guard     General bed mobility comments: for safety, incr time  Transfers Overall transfer level: Needs assistance Equipment used: None Transfers: Sit to/from Stand Sit to Stand: Min guard         General transfer comment: for safety, HHA to steady upon initial stand  Ambulation/Gait Ambulation/Gait assistance: Min guard Gait Distance (Feet): 50 Feet Assistive device: 1 person hand held assist Gait Pattern/deviations: Step-through pattern;Decreased stride length     General Gait Details: min/guard for safety,  HHA for balance, pt declined to use RW, unsteady but without overt LOB  Stairs            Wheelchair Mobility    Modified Rankin (Stroke Patients Only)       Balance Overall balance assessment: Needs assistance Sitting-balance support: Feet supported Sitting balance-Leahy Scale: Good       Standing balance-Leahy Scale: Fair Standing balance comment: unable to wt shift without UE support                             Pertinent Vitals/Pain Pain Assessment: No/denies pain    Home Living Family/patient expects to be discharged to:: Private residence Living Arrangements: Spouse/significant other Available Help at Discharge: Family Type of Home: House       Home Layout: Multi-level;Laundry or work area in [place]: Kasandra Knudsen - single point Additional Comments: infor above per pt, unsure of accuracy, no family present at time of eval    Prior Function Level of Independence: Independent               Hand Dominance        Extremity/Trunk Assessment   Upper Extremity Assessment Upper Extremity Assessment: Overall WFL for tasks assessed    Lower Extremity Assessment Lower Extremity Assessment: LLE deficits/detail;Generalized weakness;RLE deficits/detail RLE Coordination: decreased fine motor LLE Deficits / Details: significant leg length discrepancy, L shorter than R; strength at least 3/5--pt reports LEs "weak", denies sensory deficits LLE Coordination: decreased fine motor       Communication   Communication: Expressive difficulties  Cognition Arousal/Alertness: Awake/alert Behavior During Therapy: WFL for tasks  assessed/performed Overall Cognitive Status: Impaired/Different from baseline Area of Impairment: Attention;Orientation;Following commands;Problem solving                 Orientation Level: Disoriented to;Time;Situation Current Attention Level: Sustained   Following Commands: Follows one step commands  consistently;Follows multi-step commands inconsistently     Problem Solving: Slow processing;Difficulty sequencing;Requires verbal cues General Comments: pt unable to state month or year, unsure whey he is in the hospital, pt talking at length about a fall he had ~ 25years ago, repeatedly stating the incorrect year (currently and of past events); required current cues to attend to current task      General Comments      Exercises     Assessment/Plan    PT Assessment Patient needs continued PT services  PT Problem List Decreased strength;Decreased activity tolerance;Decreased balance;Decreased knowledge of use of DME;Decreased cognition;Decreased mobility;Decreased safety awareness       PT Treatment Interventions DME instruction;Gait training;Functional mobility training;Therapeutic activities;Therapeutic exercise;Patient/family education;Balance training;Stair training    PT Goals (Current goals can be found in the Care Plan section)  Acute Rehab PT Goals Patient Stated Goal: to go home soon  PT Goal Formulation: With patient Time For Goal Achievement: 07/29/18 Potential to Achieve Goals: Fair    Frequency Min 3X/week   Barriers to discharge        Co-evaluation               AM-PAC PT "6 Clicks" Daily Activity  Outcome Measure Difficulty turning over in bed (including adjusting bedclothes, sheets and blankets)?: A Little Difficulty moving from lying on back to sitting on the side of the bed? : A Lot Difficulty sitting down on and standing up from a chair with arms (e.g., wheelchair, bedside commode, etc,.)?: Unable Help needed moving to and from a bed to chair (including a wheelchair)?: A Little Help needed walking in hospital room?: A Little Help needed climbing 3-5 steps with a railing? : A Little 6 Click Score: 15    End of Session Equipment Utilized During Treatment: Gait belt Activity Tolerance: Patient tolerated treatment well Patient left: in  chair;with call bell/phone within reach;with chair alarm set   PT Visit Diagnosis: Unsteadiness on feet (R26.81)    Time: 7628-3151 PT Time Calculation (min) (ACUTE ONLY): 17 min   Charges:   PT Evaluation $PT Eval Moderate Complexity: 1 Mod          Kenyon Ana, PT Pager: [phone] 07/15/2018   [facility] [phone]   [facility] 07/15/2018, 3:25 PM

## 2018-07-15 NOTE — Progress Notes (Signed)
Initial Nutrition Assessment  DOCUMENTATION CODES:   Non-severe (moderate) malnutrition in context of chronic illness  INTERVENTION:   Encouraged small frequent meals Reviewed protein options for patient to try at home  NUTRITION DIAGNOSIS:   Moderate Malnutrition related to chronic illness(lung mass with brain mets) as evidenced by percent weight loss, energy intake < or equal to 75% for > or equal to 1 month, mild fat depletion, mild muscle depletion.  GOAL:   Patient will meet greater than or equal to 90% of their needs  MONITOR:   PO intake, Labs, Weight trends, I & O's  REASON FOR ASSESSMENT:   Consult Assessment of nutrition requirement/status  ASSESSMENT:   84 year old male with history of COPD, hypertension, GERD, prior prostate cancer in 2008, prior history of seizures currently off Keppra, recently diagnosed right upper lung mass for which he underwent bronchoscopy on 07/02/2018 and biopsy was nondiagnostic for lung cancer on the PET scan done on 07/12/2018 showed right upper lobe mass consistent with bronchogenic carcinoma, ipsilateral mediastinal nodal metastasis, metastatic brain lesion in the right frontal lobe presented on 07/13/2018 with seizure.  Patient in room with wife at bedside. Pt reports good appetite but he states he doesn't eat as much as he should at meals. 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 Pt doesn't really like protein shakes, wife states she has tried to get him to drink Ensure/Boost/Premier Protein and he will drink it reluctantly. Pt states he doesn't like sweets. Reviewed ways to add protein to diet without sweet shakes. Encouraged small, frequent meals to increase intake as well.  Pt declined protein supplements during this admission.   Per patient, UBW is 145 lb x 1 year ago. Per weight records, pt  has lost 14 lb since 3/27 (10% wt loss x 5 months, significant for time frame).   Medications: Vitamin D tablet daily, IV Decadron every 6 hours, MAG-OX tablet daily, Multivitamin with minerals daily, Thiamine tablet daily, Vitamin B-12 tablet daily Labs reviewed: CBGs: 118 Low Na, Mg  GFR: 51  NUTRITION - FOCUSED PHYSICAL EXAM:    Most Recent Value  Orbital Region  No depletion  Upper Arm Region  Mild depletion  Thoracic and Lumbar Region  Unable to assess  Buccal Region  Mild depletion  Temple Region  Moderate depletion  Clavicle Bone Region  Mild depletion  Clavicle and Acromion Bone Region  Mild depletion  Scapular Bone Region  Unable to assess  Dorsal Hand  Mild depletion  Patellar Region  Unable to assess  Anterior Thigh Region  Unable to assess  Posterior Calf Region  Unable to assess  Edema (RD Assessment)  None       Diet Order:   Diet Order            Diet NPO time specified  Diet effective midnight        Diet Heart Room service appropriate? Yes; Fluid consistency: Thin  Diet effective now              EDUCATION NEEDS:   Education needs have been addressed  Skin:  Skin Assessment: Reviewed RN Assessment  Last BM:  8/31  Height:   Ht Readings from Last 1 Encounters:  07/13/18 5\' 8"  (1.727 m)    Weight:   Wt Readings from Last 1 Encounters:  07/13/18 59.3 kg  Ideal Body Weight:  70 kg  BMI:  Body mass index is 19.88 kg/m.  Estimated Nutritional Needs:   Kcal:  1800-2000  Protein:  85-95g  Fluid:  2L/day   Clayton Bibles, MS, RD, LDN [facility] Pager: [phone] After Hours Pager: [phone]

## 2018-07-15 NOTE — Progress Notes (Signed)
Patient ID: Wyatt Stein, male   DOB: [birth year], 84 y.o.   MRN: [id number]  PROGRESS NOTE    Wyatt Stein  GEX:[id number] DOB: 09-Jan-1934 DOA: 07/13/2018 PCP: Wenda Low, MD   Brief Narrative:  84 year old male with history of COPD, hypertension, GERD, prior prostate cancer in 2008, prior history of seizures currently off Keppra, recently diagnosed right upper lung mass for which he underwent bronchoscopy on 07/02/2018 and biopsy was nondiagnostic for lung cancer on the PET scan done on 07/12/2018 showed right upper lobe mass consistent with bronchogenic carcinoma, ipsilateral mediastinal nodal metastasis, metastatic brain lesion in the right frontal lobe presented on 07/13/2018 with seizure.  Patient was started on intravenous Keppra and Decadron.  Neurology on phone recommended Keppra twice a day.  Neurosurgery was consulted by the ER provider who recommended medical management with no surgical needs currently.  Oncology and pulmonary were consulted.   Assessment & Plan:   Principal Problem:   Lung mass Active Problems:   Anemia of chronic disease   Seizure (HCC)   Malnutrition of moderate degree   Brain metastases (HCC)  New onset of seizure in a patient with history of seizures -Probably secondary to brain metastases -Case was discussed with neurology on phone by admitting hospitalist who recommended Keppra 750 mg twice a day IV for now, no need for EEG and outpatient follow-up with Dr. Aquino/neurology.  We will switch Keppra IV to oral. -No seizures overnight -Continue Decadron. -Radiation oncology evaluation appreciated.  Patient will be followed as an outpatient by radiation oncology  Right upper lobe mass/probable bronchogenic carcinoma with brain metastases with surrounding edema -Case was discussed with neurosurgery/Dr. Vertell Limber by ER provider on phone on 07/13/2018 who recommended medical management with antiseizure medications and steroids and no need for any surgical  intervention.  Continue Decadron.  MRI of the brain confirmed brain metastases with surrounding edema -Pulmonary evaluation appreciated. patient will need tissue diagnosis from his lung mass as recent bronchoscopy and biopsy was unrevealing. -Spoke to interventional radiologist on call Dr. Reesa Chew on phone today who mentioned that he was not sure if patient can have IR guided biopsy tomorrow, he will know more about it tomorrow morning.  We will keep the patient n.p.o. past midnight.  If biopsy does not happen tomorrow, CT-guided biopsy will need to be scheduled as an outpatient. -Oncology and radiation oncology following.  They will arrange for outpatient follow-up -PT evaluation today.  Anemia of chronic disease -Hemoglobin stable.  Monitor  Hyponatremia -Probably due to seizures/dehydration/HCTZ/SIADH -Improving -Discontinue IV fluids.  Repeat a.m. Labs  Hypomagnesemia -Replace.  Repeat a.m. labs  Acute kidney injury -Probably secondary to dehydration/seizures.  Improving.  Discontinue IV fluids.  Hold HCTZ.  Repeat a.m. Labs  Moderate malnutrition -Follow nutrition recommendations  Hypertension -Monitor blood pressure.  Continue amlodipine  DVT prophylaxis: SCDs  code Status: Full Family Communication: Spoke to wife at bedside Disposition Plan: Depends on clinical outcome Consultants: Pulmonary/oncology/radiation oncology. Neurosurgery was consulted on phone by ED provider Neurology was consulted by admitting hospitalist on phone  Procedures: None  Antimicrobials: None   Subjective: Patient seen and examined at bedside.  He denies any overnight fever, nausea, vomiting, chest pain.  Feels weak.  No overnight seizure-like activities. Objective: Vitals:   07/14/18 1229 07/14/18 1300 07/14/18 2047 07/15/18 0437  BP:   131/73 132/64  Pulse:  75 (!) 58 (!) 51  Resp:  15 16 16   Temp: (!) 97.3 F (36.3 C)  98.5 F (36.9 C) (!)  97.5 F (36.4 C)  TempSrc:   Oral Oral    SpO2:  93% 99% 96%  Weight:      Height:        Intake/Output Summary (Last 24 hours) at 07/15/2018 1012 Last data filed at 07/15/2018 0710 Gross per 24 hour  Intake 1706.28 ml  Output 401 ml  Net 1305.28 ml   Filed Weights   07/13/18 1402 07/13/18 1846  Weight: 61.2 kg 59.3 kg    Examination:  General exam: Appears calm and comfortable.  No acute distress Respiratory system: Bilateral decreased breath sounds at bases, no wheezing Cardiovascular system: S1 & S2 heard, mild bradycardia Gastrointestinal system: Abdomen is nondistended, soft and nontender. Normal bowel sounds heard. Extremities: No cyanosis,  edema    Data Reviewed: I have personally reviewed following labs and imaging studies  CBC: Recent Labs  Lab 07/13/18 1520 07/14/18 0403 07/15/18 0426  WBC 6.6 3.2* 9.5  NEUTROABS 5.4  --  8.7*  HGB 12.9* 11.3* 12.0*  HCT 36.6* 32.6* 34.8*  MCV 90.1 89.6 89.2  PLT 297 335 678   Basic Metabolic Panel: Recent Labs  Lab 07/13/18 1520 07/14/18 0403 07/15/18 0426  NA 129* 132* 133*  K 4.9 4.6 4.4  CL 95* 102 102  CO2 22 21* 22  GLUCOSE 119* 163* 119*  BUN 33* 29* 30*  CREATININE 1.70* 1.33* 1.44*  CALCIUM 8.7* 9.0 9.2  MG  --   --  1.5*   GFR: Estimated Creatinine Clearance: 33.2 mL/min (A) (by C-G formula based on SCr of 1.44 mg/dL (H)). Liver Function Tests: Recent Labs  Lab 07/13/18 1520 07/15/18 0426  AST 21 17  ALT 7 9  ALKPHOS 86 70  BILITOT 0.8 0.5  PROT 7.1 6.4*  ALBUMIN 3.3* 3.0*   No results for input(s): LIPASE, AMYLASE in the last 168 hours. No results for input(s): AMMONIA in the last 168 hours. Coagulation Profile: No results for input(s): INR, PROTIME in the last 168 hours. Cardiac Enzymes: Recent Labs  Lab 07/13/18 1520 07/13/18 1950  CKTOTAL  --  43*  TROPONINI <0.03  --    BNP (last 3 results) No results for input(s): PROBNP in the last 8760 hours. HbA1C: No results for input(s): HGBA1C in the last 72  hours. CBG: Recent Labs  Lab 07/12/18 1417 07/13/18 1410 07/14/18 1153  GLUCAP 99 141* 118*   Lipid Profile: No results for input(s): CHOL, HDL, LDLCALC, TRIG, CHOLHDL, LDLDIRECT in the last 72 hours. Thyroid Function Tests: No results for input(s): TSH, T4TOTAL, FREET4, T3FREE, THYROIDAB in the last 72 hours. Anemia Panel: No results for input(s): VITAMINB12, FOLATE, FERRITIN, TIBC, IRON, RETICCTPCT in the last 72 hours. Sepsis Labs: Recent Labs  Lab 07/13/18 1950  LATICACIDVEN 0.9    Recent Results (from the past 240 hour(s))  MRSA PCR Screening     Status: None   Collection Time: 07/13/18  6:56 PM  Result Value Ref Range Status   MRSA by PCR NEGATIVE NEGATIVE Final    Comment:        The GeneXpert MRSA Assay (FDA approved for NASAL specimens only), is one component of a comprehensive MRSA colonization surveillance program. It is not intended to diagnose MRSA infection nor to guide or monitor treatment for MRSA infections. Performed at [facility], [place] [address] [place], [place] [address]   Urine culture     Status: Abnormal   Collection Time: 07/13/18  7:04 PM  Result Value Ref Range Status  Specimen Description   Final    URINE, RANDOM Performed at [facility], [place] [address], [place], [place] [address]    Special Requests   Final    NONE Performed at [facility], [place] [address], [place], [place] [address]    Culture (A)  Final    <10,000 COLONIES/mL INSIGNIFICANT GROWTH Performed at [facility] [address] [place], [place] [address]    Report Status 07/15/2018 FINAL  Final         Radiology Studies: Dg Chest 2 View  Result Date: 07/13/2018 CLINICAL DATA:  Seizure activity earlier today while sitting outside at home with his wife. 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 EXAM: CHEST - 2 VIEW COMPARISON:  PET-CT yesterday. CT chest 06/21/2018. Chest  x-rays 06/20/2018 and earlier. FINDINGS: Cardiac silhouette normal in size, unchanged. Thoracic aorta mildly tortuous, unchanged. Large RIGHT UPPER LOBE lung mass extending into the RIGHT hilum as noted on the recent examinations. Lungs hyperinflated but clear otherwise. No pleural effusions. IMPRESSION: Large RIGHT UPPER LOBE lung mass extending into the RIGHT hilum as noted previously. No new abnormalities. Electronically Signed   By: Evangeline Dakin M.D.   On: 07/13/2018 16:12   Ct Head Wo Contrast  Result Date: 07/13/2018 CLINICAL DATA:  New onset seizure EXAM: CT HEAD WITHOUT CONTRAST TECHNIQUE: Contiguous axial images were obtained from the base of the skull through the vertex without intravenous contrast. COMPARISON:  MRI head 09/05/2013, 03/25/2012 FINDINGS: Brain: Hyperdense mass in the right basal ganglia measures 16 x 18 mm with extensive surrounding edema. Findings most compatible with hemorrhagic metastasis with vasogenic edema. 7 mm of midline shift. No other mass lesions are seen on unenhanced imaging. Moderate atrophy and moderate chronic microvascular ischemic change in the white matter. Vascular: Negative for hyperdense vessel Skull: Negative Sinuses/Orbits: Negative Other: None IMPRESSION: 16 x 18 mm hyperdense mass right basal ganglia with extensive surrounding edema and 7 mm midline shift. Findings most compatible with hemorrhagic metastatic disease. Patient has a large lung mass which is hypermetabolic on PET scan yesterday compatible with lung carcinoma. Moderate atrophy and moderate chronic microvascular ischemia These results were called by telephone at the time of interpretation on 07/13/2018 at 4:03 pm to Dr. Nanda Quinton , who verbally acknowledged these results. Electronically Signed   By: Franchot Gallo M.D.   On: 07/13/2018 16:04   Mr Jeri Cos BJ Contrast  Result Date: 07/13/2018 CLINICAL DATA:  Metastatic lung cancer staging EXAM: MRI HEAD WITHOUT AND WITH CONTRAST TECHNIQUE:  Multiplanar, multiecho pulse sequences of the brain and surrounding structures were obtained without and with intravenous contrast. CONTRAST:  59mL MULTIHANCE GADOBENATE DIMEGLUMINE 529 MG/ML IV SOLN COMPARISON:  CT head 07/13/2018 FINDINGS: Brain: Enhancing mass lesion in the right basal ganglia measures 22 x 17 x 21 mm. The mass has irregular enhancement with irregular contour to the lesion. Large amount of surrounding edema. Lesion is hyperdense on CT suggesting hemorrhage however MRI does not confirm hemorrhage in the lesion. 2 mm enhancing nodule in the right frontal lobe within the white matter edema. This is consistent with a second brain metastasis. No other enhancing nodules identified. Extensive edema surrounding the right basal ganglia lesion with mass-effect on the lateral ventricle. 7 mm midline shift to the left. Generalized atrophy. Chronic microvascular ischemic changes in the white matter. Negative for acute infarct. Vascular: Normal arterial flow voids Skull and upper cervical spine: Negative Sinuses/Orbits: Negative Other: None IMPRESSION: Enhancing mass  lesion right basal ganglia with extensive surrounding edema. Given history of lung cancer this is consistent with metastatic disease. Second 2 mm enhancing lesion in the right frontal lobe also consistent with metastatic disease. There is mass-effect and 7 mm midline shift to the left due to extensive edema. Electronically Signed   By: Franchot Gallo M.D.   On: 07/13/2018 20:18        Scheduled Meds: . amLODipine  5 mg Oral Daily  . cholecalciferol  1,000 Units Oral Daily  . dexamethasone  4 mg Intravenous Q6H  . latanoprost  1 drop Both Eyes QHS  . magnesium oxide  200 mg Oral Daily  . multivitamin with minerals  1 tablet Oral Daily  . thiamine  100 mg Oral Daily  . vitamin B-12  1,000 mcg Oral Daily   Continuous Infusions: . sodium chloride 75 mL/hr at 07/15/18 0452  . sodium chloride Stopped (07/13/18 1807)  . levETIRAcetam  750 mg (07/15/18 0948)  . magnesium sulfate 1 - 4 g bolus IVPB       LOS: 2 days        Aline August, MD [facility] Pager [phone]  If 7PM-7AM, please contact night-coverage [email or web address] Password [email or web address] 07/15/2018, 10:12 AM

## 2018-07-16 ENCOUNTER — Inpatient Hospital Stay (HOSPITAL_COMMUNITY): Payer: Medicare Other

## 2018-07-16 ENCOUNTER — Ambulatory Visit
Admission: RE | Admit: 2018-07-16 | Discharge: 2018-07-16 | Disposition: A | Discharge status: Home / Self Care | Payer: Medicare Other | Source: Ambulatory Visit | Attending: Radiation Oncology | Admitting: Radiation Oncology

## 2018-07-16 ENCOUNTER — Other Ambulatory Visit: Payer: Self-pay | Admitting: Radiation Therapy

## 2018-07-16 ENCOUNTER — Telehealth: Payer: Self-pay | Admitting: Hematology and Oncology

## 2018-07-16 DIAGNOSIS — Z510 Encounter for antineoplastic radiation therapy: Secondary | ICD-10-CM | POA: Insufficient documentation

## 2018-07-16 DIAGNOSIS — C7931 Secondary malignant neoplasm of brain: Secondary | ICD-10-CM | POA: Insufficient documentation

## 2018-07-16 DIAGNOSIS — C3411 Malignant neoplasm of upper lobe, right bronchus or lung: Secondary | ICD-10-CM | POA: Insufficient documentation

## 2018-07-16 LAB — CBC WITH DIFFERENTIAL/PLATELET
BASOS ABS: 0 10*3/uL (ref 0.0–0.1)
Basophils Relative: 0 %
EOS PCT: 0 %
Eosinophils Absolute: 0 10*3/uL (ref 0.0–0.7)
HEMATOCRIT: 35 % — AB (ref 39.0–52.0)
HEMOGLOBIN: 12.3 g/dL — AB (ref 13.0–17.0)
LYMPHS PCT: 5 %
Lymphs Abs: 0.5 10*3/uL — ABNORMAL LOW (ref 0.7–4.0)
MCH: 31.1 pg (ref 26.0–34.0)
MCHC: 35.1 g/dL (ref 30.0–36.0)
MCV: 88.6 fL (ref 78.0–100.0)
MONO ABS: 0.2 10*3/uL (ref 0.1–1.0)
Monocytes Relative: 3 %
NEUTROS ABS: 7.9 10*3/uL — AB (ref 1.7–7.7)
Neutrophils Relative %: 92 %
Platelets: 321 10*3/uL (ref 150–400)
RBC: 3.95 MIL/uL — ABNORMAL LOW (ref 4.22–5.81)
RDW: 13.3 % (ref 11.5–15.5)
WBC: 8.5 10*3/uL (ref 4.0–10.5)

## 2018-07-16 LAB — PROTIME-INR
INR: 0.91
PROTHROMBIN TIME: 12.2 s (ref 11.4–15.2)

## 2018-07-16 LAB — MAGNESIUM: Magnesium: 1.6 mg/dL — ABNORMAL LOW (ref 1.7–2.4)

## 2018-07-16 LAB — BASIC METABOLIC PANEL
ANION GAP: 10 (ref 5–15)
BUN: 32 mg/dL — ABNORMAL HIGH (ref 8–23)
CHLORIDE: 101 mmol/L (ref 98–111)
CO2: 23 mmol/L (ref 22–32)
CREATININE: 1.36 mg/dL — AB (ref 0.61–1.24)
Calcium: 9.3 mg/dL (ref 8.9–10.3)
GFR calc non Af Amer: 47 mL/min — ABNORMAL LOW (ref >60)
GFR, EST AFRICAN AMERICAN: 54 mL/min — AB (ref >60)
GLUCOSE: 125 mg/dL — AB (ref 70–99)
Potassium: 4.5 mmol/L (ref 3.5–5.1)
Sodium: 134 mmol/L — ABNORMAL LOW (ref 135–145)

## 2018-07-16 MED ORDER — MAGNESIUM SULFATE 2 GM/50ML IV SOLN
2.0000 g | Freq: Once | INTRAVENOUS | Status: AC
  Administered 2018-07-16: 2 g via INTRAVENOUS
  Filled 2018-07-16: qty 50

## 2018-07-16 MED ORDER — MIDAZOLAM HCL 2 MG/2ML IJ SOLN
INTRAMUSCULAR | Status: AC | PRN
  Administered 2018-07-16 (×2): 1 mg via INTRAVENOUS

## 2018-07-16 MED ORDER — LEVETIRACETAM 750 MG PO TABS: 750.0000 mg | ORAL_TABLET | Freq: Two times a day (BID) | ORAL | 0 refills | Status: DC

## 2018-07-16 MED ORDER — MIDAZOLAM HCL 2 MG/2ML IJ SOLN
INTRAMUSCULAR | Status: AC
  Filled 2018-07-16: qty 4

## 2018-07-16 MED ORDER — FENTANYL CITRATE (PF) 100 MCG/2ML IJ SOLN
INTRAMUSCULAR | Status: AC
  Filled 2018-07-16: qty 2

## 2018-07-16 MED ORDER — DEXAMETHASONE 4 MG PO TABS: 4.0000 mg | ORAL_TABLET | Freq: Four times a day (QID) | ORAL | 0 refills | Status: DC

## 2018-07-16 MED ORDER — LIDOCAINE HCL (PF) 1 % IJ SOLN
INTRAMUSCULAR | Status: AC | PRN
  Administered 2018-07-16: 10 mL

## 2018-07-16 MED ORDER — FENTANYL CITRATE (PF) 100 MCG/2ML IJ SOLN
INTRAMUSCULAR | Status: AC | PRN
  Administered 2018-07-16 (×2): 50 ug via INTRAVENOUS

## 2018-07-16 NOTE — Progress Notes (Signed)
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 Lisbeth Stein, radiation oncologist Appreciate recommendations, planned for outpatient treatment In the meantime, should he will continue high-dose Keppra with dexamethasone  Acute on chronic renal failure, improving/stable Likely secondary to dehydration Serum creatinine has improved  Goals of care The plan would be for him to get started with radiation therapy as soon as possible In the meantime, CT-guided lung biopsy is being arranged for further diagnostic purposes  Discharge planning If the patient remains stable, he can probably be discharged and resume the rest of his treatment as an outpatient. I will arrange follow-up Wyatt Lark, MD 07/16/2018  7:20 AM   Subjective:  The patient will undergo CT-guided biopsy today.  Physical therapy evaluation was completed yesterday.  He denies headache or seizures.  No worsening weakness.  Objective:  Vitals:   07/15/18 2040 07/16/18 0516  BP: (!) 147/76 (!) 150/83  Pulse: (!) 54 (!) 46  Resp: 18 18  Temp: 98.5 F (36.9 C) 97.9 F (36.6 C)  SpO2: 98% 98%     Intake/Output Summary (Last 24 hours) at 07/16/2018 0720 Last data filed at 07/16/2018 0500 Gross per 24 hour  Intake 609.03 ml  Output 1350 ml  Net -740.97 ml    GENERAL:alert, no distress and comfortable Musculoskeletal:no cyanosis of digits and no clubbing  NEURO: alert & oriented x 3 with fluent speech, no focal motor/sensory deficits   Labs:  Lab Results  Component Value Date   WBC 8.5 07/16/2018   HGB 12.3 (L) 07/16/2018   HCT 35.0 (L) 07/16/2018   MCV 88.6 07/16/2018   PLT 321 07/16/2018    NEUTROABS 7.9 (H) 07/16/2018    Lab Results  Component Value Date   NA 134 (L) 07/16/2018   K 4.5 07/16/2018   CL 101 07/16/2018   CO2 23 07/16/2018    Studies:  No results found.

## 2018-07-16 NOTE — Procedures (Signed)
Interventional Radiology Procedure Note  Procedure: CT guided biopsy of RUL mass Complications: None Recommendations: - Bedrest until CXR cleared.  Minimize talking, coughing or otherwise straining.  - Follow up 1 hr CXR pending  - NPO until CXR cleared  Signed,  Corrie Mckusick, DO

## 2018-07-16 NOTE — Care Management Note (Signed)
Case Management Note  Patient Details  Name: Wyatt Stein MRN: [id number] Date of Birth: 1934-08-12  Subjective/Objective:       Pt admitted with the diagnoses of Lung mass, anemia, and seizure      Action/Plan:  Plan to discharge home with [place] [facility]. Referral given    Expected Discharge Date:  07/16/18               Expected Discharge Plan:  [place]  In-House Referral:     Discharge planning Services  CM Consult  Post Acute Care Choice:    Choice offered to:     DME Arranged:    DME Agency:     HH Arranged:  RN, PT [place] Agency:  [facility]  Status of Service:  Completed, signed off  If discussed at [place] of Stay Meetings, dates discussed:    Additional CommentsPurcell Mouton, RN 07/16/2018, 12:38 PM

## 2018-07-16 NOTE — Discharge Summary (Signed)
Physician Discharge Summary  Wyatt Stein SJG:[id number] DOB: 06/25/1934 DOA: 07/13/2018  PCP: Wenda Low, MD  Admit date: 07/13/2018 Discharge date: 07/16/2018  Admitted From: Home Disposition:  Home  Recommendations for Outpatient Follow-up:  1. Follow up with PCP in 1 week 2. Follow-up with oncology/Dr. Alvy Bimler at earliest convenience 3. Follow-up with radiation oncology/Dr. Lisbeth Renshaw at earliest convenience 4. Outpatient follow-up with neurology/Dr. Delice Lesch 5. Follow-up with pulmonary/Dr. Melvyn Novas as needed 6. Follow-up in the ED if symptoms worsen or new per 7. Patient might benefit from outpatient evaluation by palliative care   Home Health:Yes Equipment/Devices: No  Discharge Condition: Guarded CODE STATUS: Full Diet recommendation: Heart Healthy   Brief/Interim Summary: 84 year old male with history of COPD, hypertension, GERD, prior prostate cancer in 2008, prior history of seizures currently off Keppra, recently diagnosed right upper lung mass for which he underwent bronchoscopy on 07/02/2018 and biopsy was nondiagnostic for lung cancer on the PET scan done on 07/12/2018 showed right upper lobe mass consistent with bronchogenic carcinoma, ipsilateral mediastinal nodal metastasis, metastatic brain lesion in the right frontal lobe presented on 07/13/2018 with seizure.  Patient was started on intravenous Keppra and Decadron.  Neurology on phone recommended Keppra twice a day.  Neurosurgery was consulted by the ER provider who recommended medical management with no surgical needs currently.  Oncology and pulmonary were consulted.  Patient underwent CT-guided lung biopsy today.  He has been seizure-free since admission.  Oncology has cleared the patient for discharge on oral Keppra and Decadron.  Patient will also need to follow-up with radiation oncology as an outpatient.   Discharge Diagnoses:  Principal Problem:   Lung mass Active Problems:   Anemia of chronic disease   Seizure  (HCC)   Malnutrition of moderate degree   Brain metastases (HCC)  New onset of seizure in a patient with history of seizures -Probably secondary to brain metastases -Case was discussed with neurology on phone by admitting hospitalist who recommended Keppra 750 mg twice a day; no need for EEG and outpatient follow-up with Dr. Aquino/neurology.    Currently on oral Keppra 750 mill grams twice daily which will be continued.  Patient is not supposed to drive until cleared by neurology -No seizures since admission -Continue Decadron orally 4 mg every 6 hours for now until further evaluation as an outpatient by oncology/radiation oncology. -Radiation oncology evaluation appreciated.  Patient will be followed as an outpatient by radiation oncology  Right upper lobe mass/probable bronchogenic carcinoma with brain metastases with surrounding edema -Case was discussed with neurosurgery/Dr. Vertell Limber by ER provider on phone on 07/13/2018 who recommended medical management with antiseizure medications and steroids and no need for any surgical intervention.  Continue Decadron.  MRI of the brain confirmed brain metastases with surrounding edema -Pulmonary evaluation appreciated.   Pulmonary recommended CT-guided lung biopsy -Status post CT-guided lung biopsy today.  Patient will need to follow-up with oncology as an outpatient to follow-up with results and further treatment plan -Outpatient follow-up with radiation oncology  Anemia of chronic disease -Hemoglobin stable.    Outpatient follow-up  Hyponatremia -Probably due to seizures/dehydration/HCTZ/SIADH -Improving -HCTZ will remain on hold.  Outpatient follow-up  Hypomagnesemia -Replace.    Continue oral magnesium oxide supplementation.  Outpatient follow-up  Acute kidney injury -Probably secondary to dehydration/seizures.  Improving.  Treated with IV fluids and held hydrochlorothiazide.  Moderate malnutrition -Follow nutrition  recommendations  Hypertension -Monitor blood pressure.  Continue amlodipine.  Keep hydrochlorthiazide on hold  Discharge Instructions  Discharge Instructions  Call MD for:  difficulty breathing, headache or visual disturbances   Complete by:  As directed    Call MD for:  extreme fatigue   Complete by:  As directed    Call MD for:  hives   Complete by:  As directed    Call MD for:  persistant dizziness or light-headedness   Complete by:  As directed    Call MD for:  persistant nausea and vomiting   Complete by:  As directed    Call MD for:  severe uncontrolled pain   Complete by:  As directed    Call MD for:  temperature >100.4   Complete by:  As directed    Diet - low sodium heart healthy   Complete by:  As directed    Increase activity slowly   Complete by:  As directed      Allergies as of 07/16/2018      Reactions   Ace Inhibitors Anaphylaxis, Swelling   Ramipril Anaphylaxis, Itching      Medication List    STOP taking these medications   ferrous sulfate 325 (65 FE) MG tablet   hydrochlorothiazide 12.5 MG tablet Commonly known as:  HYDRODIURIL     TAKE these medications   amLODipine 5 MG tablet Commonly known as:  NORVASC Take 5 mg by mouth daily.   benzonatate 200 MG capsule Commonly known as:  TESSALON Take 1 capsule by mouth 3 (three) times daily as needed for cough.   CENTRUM SILVER ADULT 50+ Tabs Take 1 tablet by mouth daily.   cholecalciferol 1000 units tablet Commonly known as:  VITAMIN D Take 1,000 Units by mouth daily.   [employer] B-1 100 MG tablet Generic drug:  thiamine Take 100 mg by mouth daily.   dexamethasone 4 MG tablet Commonly known as:  DECADRON Take 1 tablet (4 mg total) by mouth 4 (four) times daily.   latanoprost 0.005 % ophthalmic solution Commonly known as:  XALATAN Place 1 drop into both eyes at bedtime.   levETIRAcetam 750 MG tablet Commonly known as:  KEPPRA Take 1 tablet (750 mg total) by mouth 2 (two) times daily.    Magnesium 250 MG Tabs Take 250 mg by mouth daily.   vitamin B-12 1000 MCG tablet Commonly known as:  CYANOCOBALAMIN Take 1,000 mcg by mouth daily.       Follow-up Information    Wenda Low, MD. Schedule an appointment as soon as possible for a visit in 1 week(s).   Specialty:  Internal Medicine Contact information: [address] [place] [address] [phone]        [name], MD Follow up.   Specialty:  Hematology and Oncology Why:  at earliest convenience Contact information: [place] [address] [phone]        Kyung Rudd, MD Follow up.   Specialty:  Radiation Oncology Why:  at earliest convenience Contact information: [address] [place] [address] [phone]          Allergies  Allergen Reactions  . Ace Inhibitors Anaphylaxis and Swelling  . Ramipril Anaphylaxis and Itching    Consultations: Pulmonary/oncology/radiation oncology. Neurosurgery was consulted on phone by ED provider Neurology was consulted by admitting hospitalist on phone   Procedures/Studies: Dg Chest 2 View  Result Date: 07/13/2018 CLINICAL DATA:  Seizure activity earlier today while sitting outside at home with his wife. 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 EXAM: CHEST - 2 VIEW COMPARISON:  PET-CT yesterday. CT chest 06/21/2018. Chest x-rays 06/20/2018 and earlier. FINDINGS: Cardiac silhouette normal in size, unchanged. Thoracic aorta mildly tortuous, unchanged. Large RIGHT UPPER LOBE lung mass extending into the RIGHT hilum as noted on the recent examinations. Lungs hyperinflated but clear otherwise. No pleural effusions. IMPRESSION: Large RIGHT UPPER LOBE lung mass extending into the RIGHT hilum as noted previously. No new abnormalities. Electronically Signed   By: Evangeline Dakin M.D.   On: 07/13/2018 16:12   Dg Chest 2 View  Result Date: 06/20/2018 CLINICAL DATA:  Cough EXAM: CHEST  - 2 VIEW COMPARISON:  July 25, 2016 FINDINGS: There is an apparent mass arising in the anterior segment right upper lobe measuring 8.6 x 8.4 x 8.5 cm. No other parenchymal lung lesion evident. Lungs are somewhat hyperexpanded. Heart size and pulmonary vascularity are normal. No adenopathy. There is postoperative change in the lower thoracic and lumbar spine regions. There is thoracic lordosis. No blastic or lytic bone lesions are demonstrable. IMPRESSION: Large apparent mass arising from the anterior segment right upper lobe, presumed neoplasm. Advise contrast enhanced chest CT to further assess. Lungs otherwise are clear although hyperexpanded. No adenopathy appreciable. These results will be called to the ordering clinician or representative by the Radiologist Assistant, and communication documented in the PACS or zVision Dashboard. Electronically Signed   By: Lowella Grip III M.D.   On: 06/20/2018 14:56   Ct Head Wo Contrast  Result Date: 07/13/2018 CLINICAL DATA:  New onset seizure EXAM: CT HEAD WITHOUT CONTRAST TECHNIQUE: Contiguous axial images were obtained from the base of the skull through the vertex without intravenous contrast. COMPARISON:  MRI head 09/05/2013, 03/25/2012 FINDINGS: Brain: Hyperdense mass in the right basal ganglia measures 16 x 18 mm with extensive surrounding edema. Findings most compatible with hemorrhagic metastasis with vasogenic edema. 7 mm of midline shift. No other mass lesions are seen on unenhanced imaging. Moderate atrophy and moderate chronic microvascular ischemic change in the white matter. Vascular: Negative for hyperdense vessel Skull: Negative Sinuses/Orbits: Negative Other: None IMPRESSION: 16 x 18 mm hyperdense mass right basal ganglia with extensive surrounding edema and 7 mm midline shift. Findings most compatible with hemorrhagic metastatic disease. Patient has a large lung mass which is hypermetabolic on PET scan yesterday compatible with lung  carcinoma. Moderate atrophy and moderate chronic microvascular ischemia These results were called by telephone at the time of interpretation on 07/13/2018 at 4:03 pm to Dr. Nanda Quinton , who verbally acknowledged these results. Electronically Signed   By: Franchot Gallo M.D.   On: 07/13/2018 16:04   Ct Chest W Contrast  Result Date: 06/21/2018 CLINICAL DATA:  RIGHT upper lobe mass, abnormal chest radiograph EXAM: CT CHEST WITH CONTRAST TECHNIQUE: Multidetector CT imaging of the chest was performed during intravenous contrast administration. Sagittal and coronal MPR images reconstructed from axial data set. CONTRAST:  62mL ISOVUE-300 IOPAMIDOL (ISOVUE-300) INJECTION 61% IV COMPARISON:  Chest radiograph 06/20/2018 FINDINGS: Cardiovascular: Atherosclerotic calcifications aorta and coronary arteries. Aorta normal caliber. Pulmonary arteries grossly patent on non targeted exam. No pericardial effusion. Mediastinum/Nodes: Esophagus and visualized base of cervical region normal appearance. Enlarged RIGHT paratracheal lymph node 13 mm short axis image 58 and 19 mm short axis image 63. Necrotic mediastinal lymph node 16 mm short axis image 65. Lungs/Pleura: Large macrolobulated RIGHT upper lobe mass measuring 9.6 x 5.5 x 8.4 cm in size consistent with a primary pulmonary neoplasm. Mass demonstrates large areas of central necrosis. Mass abuts the anterior chest wall over a broad surface and  also abuts the mediastinum over at least 5.6 cm length. Mass extends to the superior aspect of the RIGHT pulmonary hilum. Postobstructive pneumonitis versus lymphangitic tumor spread peripheral to the mass in the RIGHT upper lobe. Underlying panlobular emphysema. RIGHT upper lobe scarring. Tiny foci of ground-glass opacity are seen in both lower lobes, nonspecific. Minimal dependent pleural effusion at the inferior RIGHT lung base. Calcified granuloma LEFT lower lobe image 87. No additional pulmonary mass/nodule. Upper Abdomen:  Questionable 12 mm RIGHT adrenal nodule versus nonspecific thickening. Remaining visualized upper abdomen unremarkable. Musculoskeletal: Prior thoracolumbar spinal fixation with old compression fractures of multiple vertebra. No discrete bone lesions. IMPRESSION: Large RIGHT upper lobe mass 9.6 x 5.5 x 8.4 cm in size consistent with a primary pulmonary neoplasm. Mass abuts the anterior chest wall, mediastinum and superior RIGHT hilum and demonstrates central necrosis. Postobstructive pneumonitis versus lymphangitic tumor spread in RIGHT upper lobe. Underlying pan lobular emphysema. Tiny scattered nonspecific ground-glass opacities in the lower lobes. Mediastinal adenopathy as above. Aortic Atherosclerosis (ICD10-I70.0) and Emphysema (ICD10-J43.9). Electronically Signed   By: Lavonia Dana M.D.   On: 06/21/2018 18:56   Mr Jeri Cos WU Contrast  Result Date: 07/13/2018 CLINICAL DATA:  Metastatic lung cancer staging EXAM: MRI HEAD WITHOUT AND WITH CONTRAST TECHNIQUE: Multiplanar, multiecho pulse sequences of the brain and surrounding structures were obtained without and with intravenous contrast. CONTRAST:  29mL MULTIHANCE GADOBENATE DIMEGLUMINE 529 MG/ML IV SOLN COMPARISON:  CT head 07/13/2018 FINDINGS: Brain: Enhancing mass lesion in the right basal ganglia measures 22 x 17 x 21 mm. The mass has irregular enhancement with irregular contour to the lesion. Large amount of surrounding edema. Lesion is hyperdense on CT suggesting hemorrhage however MRI does not confirm hemorrhage in the lesion. 2 mm enhancing nodule in the right frontal lobe within the white matter edema. This is consistent with a second brain metastasis. No other enhancing nodules identified. Extensive edema surrounding the right basal ganglia lesion with mass-effect on the lateral ventricle. 7 mm midline shift to the left. Generalized atrophy. Chronic microvascular ischemic changes in the white matter. Negative for acute infarct. Vascular: Normal  arterial flow voids Skull and upper cervical spine: Negative Sinuses/Orbits: Negative Other: None IMPRESSION: Enhancing mass lesion right basal ganglia with extensive surrounding edema. Given history of lung cancer this is consistent with metastatic disease. Second 2 mm enhancing lesion in the right frontal lobe also consistent with metastatic disease. There is mass-effect and 7 mm midline shift to the left due to extensive edema. Electronically Signed   By: Franchot Gallo M.D.   On: 07/13/2018 20:18   Nm Pet Image Initial (pi) Skull Base To Thigh  Result Date: 07/13/2018 CLINICAL DATA:  Initial treatment strategy for lung mass. EXAM: NUCLEAR MEDICINE PET SKULL BASE TO THIGH TECHNIQUE: 6.7 mCi F-18 FDG was injected intravenously. Full-ring PET imaging was performed from the skull base to thigh after the radiotracer. CT data was obtained and used for attenuation correction and anatomic localization. Fasting blood glucose: 99 mg/dl COMPARISON:  Chest CT 06/21/2018 FINDINGS: Mediastinal blood pool activity: SUV max 2.2 NECK: High-density lesion in the medial RIGHT frontal lobe has hypermetabolic activity above background white matter consistent with brain metastasis. No hypermetabolic lymph nodes in the neck. Incidental CT findings: none CHEST: Large RIGHT upper lobe mass measuring 8 cm is intensely hypermetabolic with SUV max 18. Portions of hypermetabolic central necrosis in the posterolateral aspect of mass. Enlarged hypermetabolic RIGHT lower paratracheal lymph node. No hypermetabolic supraclavicular nodes Incidental CT findings: Coronary artery  calcification and aortic atherosclerotic calcification. ABDOMEN/PELVIS: No abnormal hypermetabolic activity within the liver, pancreas, adrenal glands, or spleen. No hypermetabolic lymph nodes in the abdomen or pelvis. Incidental CT findings: Brachytherapy seeds the prostate gland SKELETON: No focal hypermetabolic activity to suggest skeletal metastasis. Incidental CT  findings: Posterior lumbar fusion IMPRESSION: 1. Hypermetabolic RIGHT upper lobe mass consists primary bronchogenic carcinoma. 2. Ipsilateral hypermetabolic mediastinal nodal metastasis. 3. Metastatic brain lesion in the RIGHT frontal lobe partially imaged. See comparison head CT from 07/13/2018 4. No supraclavicular adenopathy. 5. No evidence of metastatic disease below the diaphragms. Electronically Signed   By: Suzy Bouchard M.D.   On: 07/13/2018 16:14   Ct Biopsy  Result Date: 07/16/2018 INDICATION: 84 year old male with a history of FDG avid right upper lobe mass EXAM: CT BIOPSY MEDICATIONS: None. ANESTHESIA/SEDATION: Moderate (conscious) sedation was employed during this procedure. A total of Versed 2.0 mg and Fentanyl 100 mcg was administered intravenously. Moderate Sedation Time: 20 minutes. The patient's level of consciousness and vital signs were monitored continuously by radiology nursing throughout the procedure under my direct supervision. FLUOROSCOPY TIME:  CT COMPLICATIONS: None PROCEDURE: The procedure, risks, benefits, and alternatives were explained to the patient and the patient's family. Specific risks that were addressed included bleeding, infection, pneumothorax, need for further procedure including chest tube placement, chance of delayed pneumothorax or hemorrhage, hemoptysis, nondiagnostic sample, cardiopulmonary collapse, death. Questions regarding the procedure were encouraged and answered. The patient understands and consents to the procedure. Patient was positioned in the supine position on the CT gantry table and a scout CT of the chest was performed for planning purposes. Once angle of approach was determined, the skin and subcutaneous tissues this scan was prepped and draped in the usual sterile fashion, and a sterile drape was applied covering the operative field. A sterile gown and sterile gloves were used for the procedure. Local anesthesia was provided with 1% Lidocaine. The  skin and subcutaneous tissues were infiltrated 1% lidocaine for local anesthesia, and a small stab incision was made with an 11 blade scalpel. Using CT guidance, a 17 gauge trocar needle was advanced into the right upper lobetarget. After confirmation of the tip, separate 18 gauge core biopsies were performed. These were placed into solution for transportation to the lab. Biosentry Device was deployed. A final CT image was performed. Patient tolerated the procedure well and remained hemodynamically stable throughout. No complications were encountered and no significant blood loss was encounter IMPRESSION: Status post CT-guided biopsy of right upper lobe mass. Tissue specimen sent to pathology for complete histopathologic analysis. Signed, Dulcy Fanny. Dellia Nims, RPVI [facility] Radiology Electronically Signed   By: Corrie Mckusick D.O.   On: 07/16/2018 09:50   Dg Chest Port 1 View  Result Date: 07/16/2018 CLINICAL DATA:  Follow-up post CT directed biopsy of the right upper lobe mass. EXAM: PORTABLE CHEST 1 VIEW COMPARISON:  Chest x-ray of July 13, 2018 and chest CT scan from today's biopsy. FINDINGS: The soft tissue mass in the right upper lobe appears stable. No postprocedure pneumothorax or hemothorax is observed. The left lung is clear. The heart and pulmonary vascularity are normal. There is tortuosity of the ascending and descending thoracic aorta. The bony thorax exhibits no acute abnormality. IMPRESSION: There is no postprocedure complication following CT directed right upper lobe lung mass biopsy. Electronically Signed   By: David  Martinique M.D.   On: 07/16/2018 10:31     Subjective: Patient seen and examined at bedside.  She had CT-guided lung biopsy this morning.  Denies any worsening cough, fever, nausea, vomiting. Discharge Exam: Vitals:   07/16/18 0910 07/16/18 0915  BP: (!) 154/87 (!) 153/85  Pulse: (!) 59 (!) 54  Resp: 10 10  Temp:    SpO2:  99% 99%   Vitals:   07/16/18 0900 07/16/18 0905 07/16/18 0910 07/16/18 0915  BP:  (!) 179/87 (!) 154/87 (!) 153/85  Pulse: (!) 49 62 (!) 59 (!) 54  Resp: 19 12 10 10   Temp:      TempSrc:      SpO2: 99% 99% 99% 99%  Weight:      Height:        General: Pt is alert, awake, not in acute distress Cardiovascular: Mild bradycardia, S1/S2 + Respiratory: bilateral decreased breath sounds at bases Abdominal: Soft, NT, ND, bowel sounds + Extremities: no edema, no cyanosis    The results of significant diagnostics from this hospitalization (including imaging, microbiology, ancillary and laboratory) are listed below for reference.     Microbiology: Recent Results (from the past 240 hour(s))  MRSA PCR Screening     Status: None   Collection Time: 07/13/18  6:56 PM  Result Value Ref Range Status   MRSA by PCR NEGATIVE NEGATIVE Final    Comment:        The GeneXpert MRSA Assay (FDA approved for NASAL specimens only), is one component of a comprehensive MRSA colonization surveillance program. It is not intended to diagnose MRSA infection nor to guide or monitor treatment for MRSA infections. Performed at [facility], [place] [address], [place], [place] [address]   Urine culture     Status: Abnormal   Collection Time: 07/13/18  7:04 PM  Result Value Ref Range Status   Specimen Description   Final    URINE, RANDOM Performed at [facility] [address], [place], [place] [address]    Special Requests   Final    NONE Performed at [facility], [place] [address] [place], [place] [address]    Culture (A)  Final    <10,000 COLONIES/mL INSIGNIFICANT GROWTH Performed at [facility] [address], [place],  [address]    Report Status 07/15/2018 FINAL  Final     Labs: BNP (last 3 results) No results for input(s): BNP in the last 8760 hours. Basic Metabolic Panel: Recent Labs  Lab 07/13/18 1520  07/14/18 0403 07/15/18 0426 07/16/18 0443  NA 129* 132* 133* 134*  K 4.9 4.6 4.4 4.5  CL 95* 102 102 101  CO2 22 21* 22 23  GLUCOSE 119* 163* 119* 125*  BUN 33* 29* 30* 32*  CREATININE 1.70* 1.33* 1.44* 1.36*  CALCIUM 8.7* 9.0 9.2 9.3  MG  --   --  1.5* 1.6*   Liver Function Tests: Recent Labs  Lab 07/13/18 1520 07/15/18 0426  AST 21 17  ALT 7 9  ALKPHOS 86 70  BILITOT 0.8 0.5  PROT 7.1 6.4*  ALBUMIN 3.3* 3.0*   No results for input(s): LIPASE, AMYLASE in the last 168 hours. No results for input(s): AMMONIA in the last 168 hours. CBC: Recent Labs  Lab 07/13/18 1520 07/14/18 0403 07/15/18 0426 07/16/18 0443  WBC 6.6 3.2* 9.5 8.5  NEUTROABS 5.4  --  8.7* 7.9*  HGB 12.9* 11.3* 12.0* 12.3*  HCT 36.6* 32.6* 34.8* 35.0*  MCV 90.1 89.6 89.2 88.6  PLT 297 335 319 321   Cardiac Enzymes: Recent Labs  Lab 07/13/18 1520 07/13/18 1950  CKTOTAL  --  43*  TROPONINI <0.03  --    BNP: Invalid input(s): POCBNP CBG: Recent Labs  Lab 07/12/18 1417 07/13/18 1410 07/14/18 1153  GLUCAP 99 141* 118*   D-Dimer No results for input(s): DDIMER in the last 72 hours. Hgb A1c No results for input(s): HGBA1C in the last 72 hours. Lipid Profile No results for input(s): CHOL, HDL, LDLCALC, TRIG, CHOLHDL, LDLDIRECT in the last 72 hours. Thyroid function studies No results for input(s): TSH, T4TOTAL, T3FREE, THYROIDAB in the last 72 hours.  Invalid input(s): FREET3 Anemia work up No results for input(s): VITAMINB12, FOLATE, FERRITIN, TIBC, IRON, RETICCTPCT in the last 72 hours. Urinalysis    Component Value Date/Time   COLORURINE YELLOW 08/15/2015 2102   APPEARANCEUR CLEAR 08/15/2015 2102   LABSPEC 1.009 08/15/2015 2102   PHURINE 6.0 08/15/2015 2102   GLUCOSEU NEGATIVE 08/15/2015 2102   HGBUR NEGATIVE 08/15/2015 2102   BILIRUBINUR NEGATIVE 08/15/2015 2102   KETONESUR NEGATIVE 08/15/2015 2102   PROTEINUR NEGATIVE 08/15/2015 2102   UROBILINOGEN 4.0 (H) 08/15/2015 2102    NITRITE NEGATIVE 08/15/2015 2102   LEUKOCYTESUR TRACE (A) 08/15/2015 2102   Sepsis Labs Invalid input(s): PROCALCITONIN,  WBC,  LACTICIDVEN Microbiology Recent Results (from the past 240 hour(s))  MRSA PCR Screening     Status: None   Collection Time: 07/13/18  6:56 PM  Result Value Ref Range Status   MRSA by PCR NEGATIVE NEGATIVE Final    Comment:        The GeneXpert MRSA Assay (FDA approved for NASAL specimens only), is one component of a comprehensive MRSA colonization surveillance program. It is not intended to diagnose MRSA infection nor to guide or monitor treatment for MRSA infections. Performed at [facility], [place] [address], [place], [place] [address]   Urine culture     Status: Abnormal   Collection Time: 07/13/18  7:04 PM  Result Value Ref Range Status   Specimen Description   Final    URINE, RANDOM Performed at [facility] [address], [place], [place] [address]    Special Requests   Final    NONE Performed at [facility], [place] [address], [place], [place] [address]    Culture (A)  Final    <10,000 COLONIES/mL INSIGNIFICANT GROWTH Performed at [place] [address] [place], [place] [address]    Report Status 07/15/2018 FINAL  Final     Time coordinating discharge: 35 minutes  SIGNED:   Aline August, MD  [facility] 07/16/2018, 10:44 AM Pager: [phone]  If 7PM-7AM, please contact night-coverage [email or web address] Password [email or web address]

## 2018-07-16 NOTE — Progress Notes (Signed)
Physical Therapy Treatment Patient Details Name: Wyatt Stein MRN: [id number] DOB: 10/16/1934 Today's Date: 07/16/2018    History of Present Illness 84 year old male with history of COPD, hypertension, GERD, prior prostate cancer in 2008, prior hx of szs, L hip IM nail, syncope, extensive remote back surgery (per pt), recently dx R upper lung mass--> underwent bronchoscopy on 07/02/2018,  biopsy nondiagnostic for lung cancer, PET scan done on 07/12/2018 showed right upper lobe mass consistent with bronchogenic carcinoma, ipsilateral mediastinal nodal metastasis, metastatic brain lesion in the right frontal lobe presented on 07/13/2018 with seizure.     PT Comments    Assistsed pt OOB to amb a limited distance.  Pt is slow moving and required increased time to apply and remove shoes.  Increased time to complete 55 feet.  pt declined need for a walker however could have benefited as pt is present with poor forward flex posture and shuffled gait.  Pt stated he was going to [employer] 3 times a week and walking a mile.      Follow Up Recommendations  Home health PT;Supervision/Assistance - 24 hour     Equipment Recommendations  Other (comment)(may need a walker if pt agrees to use )    Recommendations for Other Services       Precautions / Restrictions Precautions Precautions: Fall Precaution Comments: pt likes to wear his shoes that have orthodics Restrictions Weight Bearing Restrictions: No    Mobility  Bed Mobility Overal bed mobility: Needs Assistance Bed Mobility: Supine to Sit;Sit to Supine     Supine to sit: Min guard Sit to supine: Min guard;Min assist   General bed mobility comments: for safety and increased time   Transfers Overall transfer level: Needs assistance Equipment used: None Transfers: Sit to/from [facility] Sit to Stand: Min guard;Min assist Stand pivot transfers: Min guard;Min assist       General transfer comment: <25% VC's on  safety with turns and required increased time to move  Ambulation/Gait Ambulation/Gait assistance: Min guard Gait Distance (Feet): 55 Feet Assistive device: 1 person hand held assist Gait Pattern/deviations: Step-through pattern;Decreased stride length Gait velocity: decreased   General Gait Details: pt declined need for a walker however could have benefited as pt is present with poor forward flex posture and shuffled gait.    Stairs             Wheelchair Mobility    Modified Rankin (Stroke Patients Only)       Balance                                            Cognition Arousal/Alertness: Awake/alert Behavior During Therapy: WFL for tasks assessed/performed Overall Cognitive Status: Within Functional Limits for tasks assessed                                 General Comments: AxO x 3 just slow to process and slow to respond      Exercises      General Comments        Pertinent Vitals/Pain Pain Assessment: No/denies pain    Home Living                      Prior Function            PT  Goals (current goals can now be found in the care plan section) Progress towards PT goals: Progressing toward goals    Frequency    Min 3X/week      PT Plan Current plan remains appropriate    Co-evaluation              AM-PAC PT "6 Clicks" Daily Activity  Outcome Measure  Difficulty turning over in bed (including adjusting bedclothes, sheets and blankets)?: A Little Difficulty moving from lying on back to sitting on the side of the bed? : A Lot Difficulty sitting down on and standing up from a chair with arms (e.g., wheelchair, bedside commode, etc,.)?: Unable Help needed moving to and from a bed to chair (including a wheelchair)?: A Little Help needed walking in hospital room?: A Little Help needed climbing 3-5 steps with a railing? : A Little 6 Click Score: 15    End of Session Equipment Utilized During  Treatment: Gait belt Activity Tolerance: Patient tolerated treatment well Patient left: in bed;with call bell/phone within reach;with family/visitor present Nurse Communication: Mobility status PT Visit Diagnosis: Unsteadiness on feet (R26.81)     Time: 3668-1594 PT Time Calculation (min) (ACUTE ONLY): 18 min  Charges:  $Gait Training: 8-22 mins                     Rica Koyanagi  PTA [facility]  Acute  Rehab Pager      [phone]

## 2018-07-16 NOTE — Progress Notes (Signed)
Pt d/c home via w/c with his wife. Both verbalized understanding of new medication ,and appts

## 2018-07-16 NOTE — Telephone Encounter (Signed)
Tried to call I did leave a voicemail reagarding 9/6

## 2018-07-17 ENCOUNTER — Other Ambulatory Visit: Payer: Self-pay | Admitting: Radiation Therapy

## 2018-07-17 DIAGNOSIS — C7931 Secondary malignant neoplasm of brain: Secondary | ICD-10-CM

## 2018-07-17 DIAGNOSIS — C7949 Secondary malignant neoplasm of other parts of nervous system: Principal | ICD-10-CM

## 2018-07-17 NOTE — Progress Notes (Signed)
M

## 2018-07-19 ENCOUNTER — Telehealth: Payer: Self-pay | Admitting: Hematology and Oncology

## 2018-07-19 ENCOUNTER — Encounter: Payer: Self-pay | Admitting: Hematology and Oncology

## 2018-07-19 ENCOUNTER — Inpatient Hospital Stay: Payer: Medicare Other | Attending: Hematology and Oncology | Admitting: Hematology and Oncology

## 2018-07-19 ENCOUNTER — Telehealth: Payer: Self-pay | Admitting: *Deleted

## 2018-07-19 DIAGNOSIS — F17210 Nicotine dependence, cigarettes, uncomplicated: Secondary | ICD-10-CM

## 2018-07-19 DIAGNOSIS — Z23 Encounter for immunization: Secondary | ICD-10-CM | POA: Insufficient documentation

## 2018-07-19 DIAGNOSIS — J449 Chronic obstructive pulmonary disease, unspecified: Secondary | ICD-10-CM | POA: Insufficient documentation

## 2018-07-19 DIAGNOSIS — R531 Weakness: Secondary | ICD-10-CM | POA: Diagnosis not present

## 2018-07-19 DIAGNOSIS — C3491 Malignant neoplasm of unspecified part of right bronchus or lung: Secondary | ICD-10-CM | POA: Diagnosis not present

## 2018-07-19 DIAGNOSIS — Z9181 History of falling: Secondary | ICD-10-CM | POA: Diagnosis not present

## 2018-07-19 DIAGNOSIS — R569 Unspecified convulsions: Secondary | ICD-10-CM

## 2018-07-19 DIAGNOSIS — Z7189 Other specified counseling: Secondary | ICD-10-CM

## 2018-07-19 DIAGNOSIS — K5909 Other constipation: Secondary | ICD-10-CM | POA: Diagnosis not present

## 2018-07-19 DIAGNOSIS — R110 Nausea: Secondary | ICD-10-CM | POA: Diagnosis not present

## 2018-07-19 DIAGNOSIS — C7931 Secondary malignant neoplasm of brain: Secondary | ICD-10-CM | POA: Diagnosis not present

## 2018-07-19 DIAGNOSIS — Z79899 Other long term (current) drug therapy: Secondary | ICD-10-CM | POA: Insufficient documentation

## 2018-07-19 DIAGNOSIS — G40909 Epilepsy, unspecified, not intractable, without status epilepticus: Secondary | ICD-10-CM | POA: Insufficient documentation

## 2018-07-19 NOTE — Assessment & Plan Note (Signed)
He is advised to quit smoking permanently

## 2018-07-19 NOTE — Assessment & Plan Note (Addendum)
The patient has stage IV metastatic lung cancer Treatment is strictly palliative in nature I will order PDL 1 testing on tissue sample The patient will qualify for clinical trial which is an [facility] [id number] after completion of palliative radiation therapy to the brain Alternatively, he would also be able to get standard chemotherapy after radiation therapy is completed I plan to see him back in a couple weeks for further supportive care In the meantime, he will continue dexamethasone and planned radiation therapy next week as scheduled

## 2018-07-19 NOTE — Progress Notes (Signed)
[place] OFFICE PROGRESS NOTE  Patient Care Team: Wenda Low, MD as PCP - General (Internal Medicine)  ASSESSMENT & PLAN:  Bronchogenic cancer of right lung [facility]) The patient has stage IV metastatic lung cancer Treatment is strictly palliative in nature I will order PDL 1 testing on tissue sample The patient will qualify for clinical trial which is an [facility] [id number] after completion of palliative radiation therapy to the brain Alternatively, he would also be able to get standard chemotherapy after radiation therapy is completed I plan to see him back in a couple weeks for further supportive care In the meantime, he will continue dexamethasone and planned radiation therapy next week as scheduled   Seizure ([place]) He denies recurrence of seizures He will continue dexamethasone and antiseizure medicine as prescribed  Cigarette smoker He is advised to quit smoking permanently  Brain metastases [facility]) He is not symptomatic since discharge from the hospital He will continue dexamethasone for now Due to anticipated weakness in the future, I recommend referral to neuro rehab facility for physical therapy  Goals of care, counseling/discussion We had extensive discussion about goals of care He understood that treatment is strictly palliative in nature The patient has a copy of advance directive and living will at home  Other constipation We discussed the importance of laxatives   Orders Placed This Encounter  Procedures  . Ambulatory referral to Physical Therapy    Referral Priority:   Urgent    Referral Type:   Physical Medicine    Referral Reason:   Specialty Services Required    Requested Specialty:   Physical Therapy    Number of Visits Requested:   1    INTERVAL HISTORY: Please see below for problem oriented charting. He returns with his wife and daughter He has not been mobile at home. He denies headache or seizures His appetite is stable He is  constipated for 6 days No recent falls at home. Denies cough, chest pain or shortness of breath. He is attempting to quit smoking.  He smoked 3 cigarettes yesterday  SUMMARY OF ONCOLOGIC HISTORY: Oncology History   Squamous cell     Bronchogenic cancer of right lung ([place])   06/21/2018 Imaging    Large RIGHT upper lobe mass 9.6 x 5.5 x 8.4 cm in size consistent with a primary pulmonary neoplasm.  Mass abuts the anterior chest wall, mediastinum and superior RIGHT hilum and demonstrates central necrosis.  Postobstructive pneumonitis versus lymphangitic tumor spread in RIGHT upper lobe.  Underlying pan lobular emphysema.  Tiny scattered nonspecific ground-glass opacities in the lower lobes.  Mediastinal adenopathy as above.  Aortic Atherosclerosis (ICD10-I70.0) and Emphysema (ICD10-J43.9)    07/02/2018 Pathology Results    Endobronchial biopsy, RUL - COMPLETELY NECROTIC TISSUE, SUGGESTIVE OF A NECROTIC TUMOR.    07/02/2018 Procedure    Bronchoscopy Procedure Note  Date of Operation: 07/02/2018   Pre-op Diagnosis: RUL mass  Post-op Diagnosis: same  Surgeon: Christinia Gully  Anesthesia: Monitored Local Anesthesia with Sedation Time Started:  0814 versed 2.5 mg IV/ demerol 25 mg IV  Time Stopped:  0825  Operation: Video Flexible fiberoptic bronchoscopy, diagnostic   Findings: mass prolapsing out of RUL apical segment completely obst it   Specimen:  BAL rul/ endobronchial bx x 2 plus portion of mass suctioned off   Estimated Blood Loss: min  Complications: none  Indications and History: See updated H and P same date. The risks, benefits, complications, treatment options and expected outcomes were discussed with  the patient.  The possibilities of reaction to medication, pulmonary aspiration, perforation of a viscus, bleeding, failure to diagnose a condition and creating a complication requiring transfusion or operation were discussed with the patient who freely  signed the consent.    Description of Procedure: The patient was re-examined in the bronchoscopy suite and the site of surgery properly noted/marked.  The patient was identified  and the procedure verified as Flexible Fiberoptic Bronchoscopy.  A Time Out was held and the above information confirmed.   After the induction of topical nasopharyngeal anesthesia, the patient was positioned  and the bronchoscope was passed through the R naris. The vocal cords were visualized and  1% buffered lidocaine 5 ml was topically placed onto the cords. The cords were nl. The scope was then passed into the trachea.  1% buffered lidocaine given topically. Airways inspected bilaterally to the subsegmental level with the following findings:  Airways nl x for mass prolapsing out of RUL apical segment completely obst it    Interventions: BAL rul/ endobronchial bx x 2 RUl     07/12/2018 PET scan    1. Hypermetabolic RIGHT upper lobe mass consists primary bronchogenic carcinoma. 2. Ipsilateral hypermetabolic mediastinal nodal metastasis. 3. Metastatic brain lesion in the RIGHT frontal lobe partially imaged. See comparison head CT from 07/13/2018 4. No supraclavicular adenopathy. 5. No evidence of metastatic disease below the diaphragms.    07/13/2018 Imaging    CT head 16 x 18 mm hyperdense mass right basal ganglia with extensive surrounding edema and 7 mm midline shift. Findings most compatible with hemorrhagic metastatic disease. Patient has a large lung mass which is hypermetabolic on PET scan yesterday compatible with lung carcinoma.  Moderate atrophy and moderate chronic microvascular ischemia     07/13/2018 Imaging    MRI brain Enhancing mass lesion right basal ganglia with extensive surrounding edema. Given history of lung cancer this is consistent with metastatic disease. Second 2 mm enhancing lesion in the right frontal lobe also consistent with metastatic disease. There is mass-effect and 7 mm  midline shift to the left due to extensive edema.    07/16/2018 Procedure    Status post CT-guided biopsy of right upper lobe mass. Tissue specimen sent to pathology for complete histopathologic analysis.    07/16/2018 Pathology Results    Lung, biopsy, right upper lobe - NON-SMALL CELL CARCINOMA, SEE COMMENT. Microscopic Comment Immunohistochemistry is focally positive for cytokeratin 5/6 and p63. TTF-1 and Napsin A are negative. The immunoprofile slightly favors a squamous cell carcinoma. There is likely sufficient tissue for additional studies if requested (Block 1A). Dr. Lyndon Code has reviewed the case.    07/19/2018 Cancer Staging    Staging form: Lung, AJCC 8th Edition - Clinical: Stage IV (cT4, cN2, cM1) - Signed by [name], MD on 07/19/2018     REVIEW OF SYSTEMS:   Constitutional: Denies fevers, chills or abnormal weight loss Eyes: Denies blurriness of vision Ears, nose, mouth, throat, and face: Denies mucositis or sore throat Respiratory: Denies cough, dyspnea or wheezes Cardiovascular: Denies palpitation, chest discomfort or lower extremity swelling Skin: Denies abnormal skin rashes Lymphatics: Denies new lymphadenopathy or easy bruising Behavioral/Psych: Mood is stable, no new changes  All other systems were reviewed with the patient and are negative.  I have reviewed the past medical history, past surgical history, social history and family history with the patient and they are unchanged from previous note.  ALLERGIES:  is allergic to ace inhibitors and ramipril.  MEDICATIONS:  Current Outpatient  Medications  Medication Sig Dispense Refill  . amLODipine (NORVASC) 5 MG tablet Take 5 mg by mouth daily.  5  . benzonatate (TESSALON) 200 MG capsule Take 1 capsule by mouth 3 (three) times daily as needed for cough.   0  . cholecalciferol (VITAMIN D) 1000 UNITS tablet Take 1,000 Units by mouth daily.    . CVS B-1 100 MG tablet Take 100 mg by mouth daily.  11  . dexamethasone  (DECADRON) 4 MG tablet Take 1 tablet (4 mg total) by mouth 4 (four) times daily. 30 tablet 0  . latanoprost (XALATAN) 0.005 % ophthalmic solution Place 1 drop into both eyes at bedtime.   12  . levETIRAcetam (KEPPRA) 750 MG tablet Take 1 tablet (750 mg total) by mouth 2 (two) times daily. 60 tablet 0  . Magnesium 250 MG TABS Take 250 mg by mouth daily.     . Multiple Vitamins-Minerals (CENTRUM SILVER ADULT 50+) TABS Take 1 tablet by mouth daily.    . vitamin B-12 (CYANOCOBALAMIN) 1000 MCG tablet Take 1,000 mcg by mouth daily.     No current facility-administered medications for this visit.     PHYSICAL EXAMINATION: ECOG PERFORMANCE STATUS: 2 - Symptomatic, <50% confined to bed  Vitals:   07/19/18 1414  BP: 108/60  Pulse: 62  Resp: 18  Temp: 98 F (36.7 C)  SpO2: 100%   Filed Weights   07/19/18 1414  Weight: 137 lb 9.6 oz (62.4 kg)    GENERAL:alert, no distress and comfortable SKIN: skin color, texture, turgor are normal, no rashes or significant lesions EYES: normal, Conjunctiva are pink and non-injected, sclera clear OROPHARYNX:no exudate, no erythema and lips, buccal mucosa, and tongue normal  NECK: supple, thyroid normal size, non-tender, without nodularity LYMPH:  no palpable lymphadenopathy in the cervical, axillary or inguinal LUNGS: clear to auscultation and percussion with normal breathing effort HEART: regular rate & rhythm and no murmurs and no lower extremity edema ABDOMEN:abdomen soft, non-tender and normal bowel sounds Musculoskeletal:no cyanosis of digits and no clubbing  NEURO: alert & oriented x 3 with fluent speech, no focal motor/sensory deficits  LABORATORY DATA:  I have reviewed the data as listed    Component Value Date/Time   NA 134 (L) 07/16/2018 0443   K 4.5 07/16/2018 0443   CL 101 07/16/2018 0443   CO2 23 07/16/2018 0443   GLUCOSE 125 (H) 07/16/2018 0443   BUN 32 (H) 07/16/2018 0443   CREATININE 1.36 (H) 07/16/2018 0443   CALCIUM 9.3  07/16/2018 0443   PROT 6.4 (L) 07/15/2018 0426   ALBUMIN 3.0 (L) 07/15/2018 0426   AST 17 07/15/2018 0426   ALT 9 07/15/2018 0426   ALKPHOS 70 07/15/2018 0426   BILITOT 0.5 07/15/2018 0426   GFRNONAA 47 (L) 07/16/2018 0443   GFRAA 54 (L) 07/16/2018 0443    No results found for: SPEP, UPEP  Lab Results  Component Value Date   WBC 8.5 07/16/2018   NEUTROABS 7.9 (H) 07/16/2018   HGB 12.3 (L) 07/16/2018   HCT 35.0 (L) 07/16/2018   MCV 88.6 07/16/2018   PLT 321 07/16/2018      Chemistry      Component Value Date/Time   NA 134 (L) 07/16/2018 0443   K 4.5 07/16/2018 0443   CL 101 07/16/2018 0443   CO2 23 07/16/2018 0443   BUN 32 (H) 07/16/2018 0443   CREATININE 1.36 (H) 07/16/2018 0443      Component Value Date/Time   CALCIUM 9.3 07/16/2018  0443   ALKPHOS 70 07/15/2018 0426   AST 17 07/15/2018 0426   ALT 9 07/15/2018 0426   BILITOT 0.5 07/15/2018 0426       RADIOGRAPHIC STUDIES: I have personally reviewed the radiological images as listed and agreed with the findings in the report. Dg Chest 2 View  Result Date: 07/13/2018 CLINICAL DATA:  Seizure activity earlier today while sitting outside at home with his wife. 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 EXAM: CHEST - 2 VIEW COMPARISON:  PET-CT yesterday. CT chest 06/21/2018. Chest x-rays 06/20/2018 and earlier. FINDINGS: Cardiac silhouette normal in size, unchanged. Thoracic aorta mildly tortuous, unchanged. Large RIGHT UPPER LOBE lung mass extending into the RIGHT hilum as noted on the recent examinations. Lungs hyperinflated but clear otherwise. No pleural effusions. IMPRESSION: Large RIGHT UPPER LOBE lung mass extending into the RIGHT hilum as noted previously. No new abnormalities. Electronically Signed   By: Evangeline Dakin M.D.   On: 07/13/2018 16:12   Dg Chest 2 View  Result Date: 06/20/2018 CLINICAL DATA:  Cough EXAM: CHEST - 2 VIEW COMPARISON:  July 25, 2016 FINDINGS: There is an  apparent mass arising in the anterior segment right upper lobe measuring 8.6 x 8.4 x 8.5 cm. No other parenchymal lung lesion evident. Lungs are somewhat hyperexpanded. Heart size and pulmonary vascularity are normal. No adenopathy. There is postoperative change in the lower thoracic and lumbar spine regions. There is thoracic lordosis. No blastic or lytic bone lesions are demonstrable. IMPRESSION: Large apparent mass arising from the anterior segment right upper lobe, presumed neoplasm. Advise contrast enhanced chest CT to further assess. Lungs otherwise are clear although hyperexpanded. No adenopathy appreciable. These results will be called to the ordering clinician or representative by the Radiologist Assistant, and communication documented in the PACS or zVision Dashboard. Electronically Signed   By: Lowella Grip III M.D.   On: 06/20/2018 14:56   Ct Head Wo Contrast  Result Date: 07/13/2018 CLINICAL DATA:  New onset seizure EXAM: CT HEAD WITHOUT CONTRAST TECHNIQUE: Contiguous axial images were obtained from the base of the skull through the vertex without intravenous contrast. COMPARISON:  MRI head 09/05/2013, 03/25/2012 FINDINGS: Brain: Hyperdense mass in the right basal ganglia measures 16 x 18 mm with extensive surrounding edema. Findings most compatible with hemorrhagic metastasis with vasogenic edema. 7 mm of midline shift. No other mass lesions are seen on unenhanced imaging. Moderate atrophy and moderate chronic microvascular ischemic change in the white matter. Vascular: Negative for hyperdense vessel Skull: Negative Sinuses/Orbits: Negative Other: None IMPRESSION: 16 x 18 mm hyperdense mass right basal ganglia with extensive surrounding edema and 7 mm midline shift. Findings most compatible with hemorrhagic metastatic disease. Patient has a large lung mass which is hypermetabolic on PET scan yesterday compatible with lung carcinoma. Moderate atrophy and moderate chronic microvascular ischemia  These results were called by telephone at the time of interpretation on 07/13/2018 at 4:03 pm to Dr. Nanda Quinton , who verbally acknowledged these results. Electronically Signed   By: Franchot Gallo M.D.   On: 07/13/2018 16:04   Ct Chest W Contrast  Result Date: 06/21/2018 CLINICAL DATA:  RIGHT upper lobe mass, abnormal chest radiograph EXAM: CT CHEST WITH CONTRAST TECHNIQUE: Multidetector CT imaging of the chest was performed during intravenous contrast administration. Sagittal and coronal MPR images reconstructed from axial data set. CONTRAST:  59m ISOVUE-300 IOPAMIDOL (ISOVUE-300) INJECTION 61% IV COMPARISON:  Chest radiograph 06/20/2018 FINDINGS: Cardiovascular: Atherosclerotic calcifications aorta and coronary arteries. Aorta normal caliber.  Pulmonary arteries grossly patent on non targeted exam. No pericardial effusion. Mediastinum/Nodes: Esophagus and visualized base of cervical region normal appearance. Enlarged RIGHT paratracheal lymph node 13 mm short axis image 58 and 19 mm short axis image 63. Necrotic mediastinal lymph node 16 mm short axis image 65. Lungs/Pleura: Large macrolobulated RIGHT upper lobe mass measuring 9.6 x 5.5 x 8.4 cm in size consistent with a primary pulmonary neoplasm. Mass demonstrates large areas of central necrosis. Mass abuts the anterior chest wall over a broad surface and also abuts the mediastinum over at least 5.6 cm length. Mass extends to the superior aspect of the RIGHT pulmonary hilum. Postobstructive pneumonitis versus lymphangitic tumor spread peripheral to the mass in the RIGHT upper lobe. Underlying panlobular emphysema. RIGHT upper lobe scarring. Tiny foci of ground-glass opacity are seen in both lower lobes, nonspecific. Minimal dependent pleural effusion at the inferior RIGHT lung base. Calcified granuloma LEFT lower lobe image 87. No additional pulmonary mass/nodule. Upper Abdomen: Questionable 12 mm RIGHT adrenal nodule versus nonspecific thickening. Remaining  visualized upper abdomen unremarkable. Musculoskeletal: Prior thoracolumbar spinal fixation with old compression fractures of multiple vertebra. No discrete bone lesions. IMPRESSION: Large RIGHT upper lobe mass 9.6 x 5.5 x 8.4 cm in size consistent with a primary pulmonary neoplasm. Mass abuts the anterior chest wall, mediastinum and superior RIGHT hilum and demonstrates central necrosis. Postobstructive pneumonitis versus lymphangitic tumor spread in RIGHT upper lobe. Underlying pan lobular emphysema. Tiny scattered nonspecific ground-glass opacities in the lower lobes. Mediastinal adenopathy as above. Aortic Atherosclerosis (ICD10-I70.0) and Emphysema (ICD10-J43.9). Electronically Signed   By: Lavonia Dana M.D.   On: 06/21/2018 18:56   Mr Jeri Cos WU Contrast  Result Date: 07/13/2018 CLINICAL DATA:  Metastatic lung cancer staging EXAM: MRI HEAD WITHOUT AND WITH CONTRAST TECHNIQUE: Multiplanar, multiecho pulse sequences of the brain and surrounding structures were obtained without and with intravenous contrast. CONTRAST:  72m MULTIHANCE GADOBENATE DIMEGLUMINE 529 MG/ML IV SOLN COMPARISON:  CT head 07/13/2018 FINDINGS: Brain: Enhancing mass lesion in the right basal ganglia measures 22 x 17 x 21 mm. The mass has irregular enhancement with irregular contour to the lesion. Large amount of surrounding edema. Lesion is hyperdense on CT suggesting hemorrhage however MRI does not confirm hemorrhage in the lesion. 2 mm enhancing nodule in the right frontal lobe within the white matter edema. This is consistent with a second brain metastasis. No other enhancing nodules identified. Extensive edema surrounding the right basal ganglia lesion with mass-effect on the lateral ventricle. 7 mm midline shift to the left. Generalized atrophy. Chronic microvascular ischemic changes in the white matter. Negative for acute infarct. Vascular: Normal arterial flow voids Skull and upper cervical spine: Negative Sinuses/Orbits: Negative  Other: None IMPRESSION: Enhancing mass lesion right basal ganglia with extensive surrounding edema. Given history of lung cancer this is consistent with metastatic disease. Second 2 mm enhancing lesion in the right frontal lobe also consistent with metastatic disease. There is mass-effect and 7 mm midline shift to the left due to extensive edema. Electronically Signed   By: CFranchot GalloM.D.   On: 07/13/2018 20:18   Nm Pet Image Initial (pi) Skull Base To Thigh  Result Date: 07/13/2018 CLINICAL DATA:  Initial treatment strategy for lung mass. EXAM: NUCLEAR MEDICINE PET SKULL BASE TO THIGH TECHNIQUE: 6.7 mCi F-18 FDG was injected intravenously. Full-ring PET imaging was performed from the skull base to thigh after the radiotracer. CT data was obtained and used for attenuation correction and anatomic localization. Fasting blood glucose:  99 mg/dl COMPARISON:  Chest CT 06/21/2018 FINDINGS: Mediastinal blood pool activity: SUV max 2.2 NECK: High-density lesion in the medial RIGHT frontal lobe has hypermetabolic activity above background white matter consistent with brain metastasis. No hypermetabolic lymph nodes in the neck. Incidental CT findings: none CHEST: Large RIGHT upper lobe mass measuring 8 cm is intensely hypermetabolic with SUV max 18. Portions of hypermetabolic central necrosis in the posterolateral aspect of mass. Enlarged hypermetabolic RIGHT lower paratracheal lymph node. No hypermetabolic supraclavicular nodes Incidental CT findings: Coronary artery calcification and aortic atherosclerotic calcification. ABDOMEN/PELVIS: No abnormal hypermetabolic activity within the liver, pancreas, adrenal glands, or spleen. No hypermetabolic lymph nodes in the abdomen or pelvis. Incidental CT findings: Brachytherapy seeds the prostate gland SKELETON: No focal hypermetabolic activity to suggest skeletal metastasis. Incidental CT findings: Posterior lumbar fusion IMPRESSION: 1. Hypermetabolic RIGHT upper lobe mass  consists primary bronchogenic carcinoma. 2. Ipsilateral hypermetabolic mediastinal nodal metastasis. 3. Metastatic brain lesion in the RIGHT frontal lobe partially imaged. See comparison head CT from 07/13/2018 4. No supraclavicular adenopathy. 5. No evidence of metastatic disease below the diaphragms. Electronically Signed   By: Suzy Bouchard M.D.   On: 07/13/2018 16:14   Ct Biopsy  Result Date: 07/16/2018 INDICATION: 84 year old male with a history of FDG avid right upper lobe mass EXAM: CT BIOPSY MEDICATIONS: None. ANESTHESIA/SEDATION: Moderate (conscious) sedation was employed during this procedure. A total of Versed 2.0 mg and Fentanyl 100 mcg was administered intravenously. Moderate Sedation Time: 20 minutes. The patient's level of consciousness and vital signs were monitored continuously by radiology nursing throughout the procedure under my direct supervision. FLUOROSCOPY TIME:  CT COMPLICATIONS: None PROCEDURE: The procedure, risks, benefits, and alternatives were explained to the patient and the patient's family. Specific risks that were addressed included bleeding, infection, pneumothorax, need for further procedure including chest tube placement, chance of delayed pneumothorax or hemorrhage, hemoptysis, nondiagnostic sample, cardiopulmonary collapse, death. Questions regarding the procedure were encouraged and answered. The patient understands and consents to the procedure. Patient was positioned in the supine position on the CT gantry table and a scout CT of the chest was performed for planning purposes. Once angle of approach was determined, the skin and subcutaneous tissues this scan was prepped and draped in the usual sterile fashion, and a sterile drape was applied covering the operative field. A sterile gown and sterile gloves were used for the procedure. Local anesthesia was provided with 1% Lidocaine. The skin and subcutaneous tissues were infiltrated 1% lidocaine for local anesthesia, and  a small stab incision was made with an 11 blade scalpel. Using CT guidance, a 17 gauge trocar needle was advanced into the right upper lobetarget. After confirmation of the tip, separate 18 gauge core biopsies were performed. These were placed into solution for transportation to the lab. Biosentry Device was deployed. A final CT image was performed. Patient tolerated the procedure well and remained hemodynamically stable throughout. No complications were encountered and no significant blood loss was encounter IMPRESSION: Status post CT-guided biopsy of right upper lobe mass. Tissue specimen sent to pathology for complete histopathologic analysis. Signed, Dulcy Fanny. Dellia Nims, RPVI [facility] Radiology Electronically Signed   By: Corrie Mckusick D.O.   On: 07/16/2018 09:50   Dg Chest Port 1 View  Result Date: 07/16/2018 CLINICAL DATA:  Follow-up post CT directed biopsy of the right upper lobe mass. EXAM: PORTABLE CHEST 1 VIEW COMPARISON:  Chest x-ray of July 13, 2018 and chest CT scan from today's biopsy.  FINDINGS: The soft tissue mass in the right upper lobe appears stable. No postprocedure pneumothorax or hemothorax is observed. The left lung is clear. The heart and pulmonary vascularity are normal. There is tortuosity of the ascending and descending thoracic aorta. The bony thorax exhibits no acute abnormality. IMPRESSION: There is no postprocedure complication following CT directed right upper lobe lung mass biopsy. Electronically Signed   By: David  Martinique M.D.   On: 07/16/2018 10:31    All questions were answered. The patient knows to call the clinic with any problems, questions or concerns. No barriers to learning was detected.  I spent 40 minutes counseling the patient face to face. The total time spent in the appointment was 55 minutes and more than 50% was on counseling and review of test results  [name], MD 07/19/2018 2:41 PM

## 2018-07-19 NOTE — Assessment & Plan Note (Signed)
We had extensive discussion about goals of care He understood that treatment is strictly palliative in nature The patient has a copy of advance directive and living will at home

## 2018-07-19 NOTE — Telephone Encounter (Signed)
Per request from Dr Alvy Bimler - path at [facility] contacted for PD-L1 to be ordered from most recent pathology.  Per Suanne Marker in path - demographics reviewed and verified for tissue from 07/16/2018 to be sent for PD-L1 testing.

## 2018-07-19 NOTE — Telephone Encounter (Signed)
Gave pt avs and calendar  °

## 2018-07-19 NOTE — Assessment & Plan Note (Addendum)
He is not symptomatic since discharge from the hospital He will continue dexamethasone for now Due to anticipated weakness in the future, I recommend referral to neuro rehab facility for physical therapy

## 2018-07-19 NOTE — Assessment & Plan Note (Signed)
We discussed the importance of laxatives

## 2018-07-19 NOTE — Assessment & Plan Note (Signed)
He denies recurrence of seizures He will continue dexamethasone and antiseizure medicine as prescribed

## 2018-07-22 ENCOUNTER — Inpatient Hospital Stay: Payer: Medicare Other

## 2018-07-22 ENCOUNTER — Other Ambulatory Visit: Payer: Self-pay | Admitting: Radiation Oncology

## 2018-07-22 MED ORDER — DEXAMETHASONE 4 MG PO TABS: 4.0000 mg | ORAL_TABLET | Freq: Four times a day (QID) | ORAL | 0 refills | Status: DC

## 2018-07-23 DIAGNOSIS — B351 Tinea unguium: Secondary | ICD-10-CM | POA: Diagnosis not present

## 2018-07-23 DIAGNOSIS — I739 Peripheral vascular disease, unspecified: Secondary | ICD-10-CM | POA: Diagnosis not present

## 2018-07-23 NOTE — Progress Notes (Signed)
Brain and Spine Tumor Board Documentation  Wyatt Stein was presented by Cecil Cobbs, MD at Brain and Spine Tumor Board on 07/23/2018, which included representatives from neuro oncology, radiation oncology, surgical oncology, radiology, pathology, navigation, genetics.  Wyatt Stein was presented as a new patient with history of the following treatments:  .  Additionally, we reviewed previous medical and familial history, history of present illness, and recent lab results along with all available histopathologic and imaging studies. The tumor board considered available treatment options and made the following recommendations:  Radiation therapy (primary modality)  SRS to right lentiform metastasis  Tumor board is a meeting of clinicians from various specialty areas who evaluate and discuss patients for whom a multidisciplinary approach is being considered. Final determinations in the plan of care are those of the provider(s). The responsibility for follow up of recommendations given during tumor board is that of the provider.   Today's extended care, comprehensive team conference, Wyatt Stein was not present for the discussion and was not examined.

## 2018-07-24 ENCOUNTER — Ambulatory Visit
Admission: RE | Admit: 2018-07-24 | Discharge: 2018-07-24 | Disposition: A | Discharge status: Home / Self Care | Payer: Medicare Other | Source: Ambulatory Visit | Attending: Radiation Oncology | Admitting: Radiation Oncology

## 2018-07-24 DIAGNOSIS — C7931 Secondary malignant neoplasm of brain: Secondary | ICD-10-CM

## 2018-07-24 DIAGNOSIS — C7949 Secondary malignant neoplasm of other parts of nervous system: Principal | ICD-10-CM

## 2018-07-24 MED ORDER — GADOBENATE DIMEGLUMINE 529 MG/ML IV SOLN
12.0000 mL | Freq: Once | INTRAVENOUS | Status: AC | PRN
  Administered 2018-07-24: 12 mL via INTRAVENOUS

## 2018-07-24 NOTE — Progress Notes (Signed)
Has armband been applied?  Yes  Does patient have an allergy to IV contrast dye?: No   Has patient ever received premedication for IV contrast dye?: No  Does patient take metformin?: No  If patient does take metformin when was the last dose: N/A  Date of lab work: 07/16/2018  BUN: 32 CRT: 1.36 eGfr: 54   IV site: Right AC  Has IV site been added to flowsheet?  Yes  BP 132/68 (BP Location: Left Arm, Patient Position: Sitting)   Pulse 60   Temp 98 F (36.7 C) (Oral)   Resp 20   Ht 5' 8" (1.727 m)   Wt 145 lb 12.8 oz (66.1 kg)   SpO2 100%   BMI 22.17 kg/m  

## 2018-07-25 ENCOUNTER — Other Ambulatory Visit: Payer: Self-pay | Admitting: Radiation Oncology

## 2018-07-25 ENCOUNTER — Ambulatory Visit
Admission: RE | Admit: 2018-07-25 | Discharge: 2018-07-25 | Disposition: A | Discharge status: Home / Self Care | Payer: Medicare Other | Source: Ambulatory Visit | Attending: Radiation Oncology | Admitting: Radiation Oncology

## 2018-07-25 VITALS — BP 132/68 | HR 60 | Temp 98.0°F | Resp 20 | Ht 68.0 in | Wt 145.8 lb

## 2018-07-25 VITALS — BP 132/68 | HR 60 | Temp 98.0°F | Resp 20 | Ht 68.0 in | Wt 145.5 lb

## 2018-07-25 DIAGNOSIS — C3411 Malignant neoplasm of upper lobe, right bronchus or lung: Secondary | ICD-10-CM | POA: Diagnosis not present

## 2018-07-25 DIAGNOSIS — Z510 Encounter for antineoplastic radiation therapy: Secondary | ICD-10-CM | POA: Diagnosis not present

## 2018-07-25 DIAGNOSIS — C3491 Malignant neoplasm of unspecified part of right bronchus or lung: Secondary | ICD-10-CM

## 2018-07-25 DIAGNOSIS — C7931 Secondary malignant neoplasm of brain: Secondary | ICD-10-CM

## 2018-07-25 MED ORDER — NYSTATIN 100000 UNIT/ML MT SUSP: 5.0000 mL | Freq: Four times a day (QID) | OROMUCOSAL | 0 refills | Status: DC

## 2018-07-25 MED ORDER — SODIUM CHLORIDE 0.9% FLUSH
10.0000 mL | Freq: Once | INTRAVENOUS | Status: AC
  Administered 2018-07-25: 10 mL via INTRAVENOUS

## 2018-07-26 ENCOUNTER — Telehealth: Payer: Self-pay | Admitting: Medical Oncology

## 2018-07-26 NOTE — Telephone Encounter (Signed)
Home care--Pt declined [facility] skilled nursing -he wants PT , which will start Monday.

## 2018-07-29 DIAGNOSIS — R569 Unspecified convulsions: Secondary | ICD-10-CM | POA: Diagnosis not present

## 2018-07-29 DIAGNOSIS — Z8546 Personal history of malignant neoplasm of prostate: Secondary | ICD-10-CM | POA: Diagnosis not present

## 2018-07-29 DIAGNOSIS — K219 Gastro-esophageal reflux disease without esophagitis: Secondary | ICD-10-CM | POA: Diagnosis not present

## 2018-07-29 DIAGNOSIS — Z510 Encounter for antineoplastic radiation therapy: Secondary | ICD-10-CM | POA: Diagnosis not present

## 2018-07-29 DIAGNOSIS — C3411 Malignant neoplasm of upper lobe, right bronchus or lung: Secondary | ICD-10-CM | POA: Diagnosis not present

## 2018-07-29 DIAGNOSIS — F17200 Nicotine dependence, unspecified, uncomplicated: Secondary | ICD-10-CM | POA: Diagnosis not present

## 2018-07-29 DIAGNOSIS — R918 Other nonspecific abnormal finding of lung field: Secondary | ICD-10-CM | POA: Diagnosis not present

## 2018-07-29 DIAGNOSIS — F17210 Nicotine dependence, cigarettes, uncomplicated: Secondary | ICD-10-CM | POA: Diagnosis not present

## 2018-07-29 DIAGNOSIS — C7931 Secondary malignant neoplasm of brain: Secondary | ICD-10-CM | POA: Diagnosis not present

## 2018-07-29 DIAGNOSIS — J449 Chronic obstructive pulmonary disease, unspecified: Secondary | ICD-10-CM | POA: Diagnosis not present

## 2018-07-29 DIAGNOSIS — D630 Anemia in neoplastic disease: Secondary | ICD-10-CM | POA: Diagnosis not present

## 2018-07-29 DIAGNOSIS — I10 Essential (primary) hypertension: Secondary | ICD-10-CM | POA: Diagnosis not present

## 2018-07-29 DIAGNOSIS — C3490 Malignant neoplasm of unspecified part of unspecified bronchus or lung: Secondary | ICD-10-CM | POA: Diagnosis not present

## 2018-07-29 DIAGNOSIS — E440 Moderate protein-calorie malnutrition: Secondary | ICD-10-CM | POA: Diagnosis not present

## 2018-07-30 ENCOUNTER — Telehealth: Payer: Self-pay

## 2018-07-30 ENCOUNTER — Ambulatory Visit
Admission: RE | Admit: 2018-07-30 | Discharge: 2018-07-30 | Disposition: A | Discharge status: Home / Self Care | Payer: Medicare Other | Source: Ambulatory Visit | Attending: Radiation Oncology | Admitting: Radiation Oncology

## 2018-07-30 VITALS — BP 176/83 | HR 53 | Temp 97.5°F | Resp 16

## 2018-07-30 DIAGNOSIS — C3411 Malignant neoplasm of upper lobe, right bronchus or lung: Secondary | ICD-10-CM | POA: Diagnosis not present

## 2018-07-30 DIAGNOSIS — C7931 Secondary malignant neoplasm of brain: Secondary | ICD-10-CM | POA: Diagnosis not present

## 2018-07-30 DIAGNOSIS — Z510 Encounter for antineoplastic radiation therapy: Secondary | ICD-10-CM | POA: Diagnosis not present

## 2018-07-30 DIAGNOSIS — C3490 Malignant neoplasm of unspecified part of unspecified bronchus or lung: Secondary | ICD-10-CM | POA: Diagnosis not present

## 2018-07-30 DIAGNOSIS — C3491 Malignant neoplasm of unspecified part of right bronchus or lung: Secondary | ICD-10-CM

## 2018-07-30 NOTE — Progress Notes (Signed)
Wyatt Stein rested with Wyatt Stein for 30 minutes following his SRS treatment. Patient denies headache, dizziness, nausea, diplopia or ringing in the ears. Denies fatigue. Patient without complaints. Understands to avoid strenuous activity for the next 24 hours and call [phone] with needs.

## 2018-07-30 NOTE — Progress Notes (Signed)
Pt arrived to room 1 in [facility]. No c/o pain. Decadron taper instructions given to wife. Wife to call office tomorrow with number of Decadron tabs left. Dr. Lisbeth Renshaw in to speak with pt and wife. All questions answered. Pt rested with this RN for 30 minutes. Pt and wife were instructed to call [phone] with any neurological changes/decline.   BP (!) 176/83 (BP Location: Left Arm, Patient Position: Sitting)   Pulse (!) 53   Temp (!) 97.5 F (36.4 C) (Oral)   Resp 16   SpO2 100%   Loma Sousa, RN BSN

## 2018-07-30 NOTE — Telephone Encounter (Signed)
VO given by phone to Linward Headland, PT for [facility]. PT 2 x week for 3 weeks. Clair Gulling reports that pt noted to have +1 lower extremity edema.  Skin dry, intact, no pain.  Instructed Jim to have pt elevate legs as much as possible and to report to [place] any changes.

## 2018-07-31 ENCOUNTER — Encounter (HOSPITAL_COMMUNITY): Payer: Self-pay

## 2018-07-31 ENCOUNTER — Emergency Department (HOSPITAL_COMMUNITY)
Admission: EM | Admit: 2018-07-31 | Discharge: 2018-07-31 | Disposition: A | Discharge status: Home / Self Care | Payer: Medicare Other | Attending: Emergency Medicine | Admitting: Emergency Medicine

## 2018-07-31 ENCOUNTER — Other Ambulatory Visit: Payer: Self-pay | Admitting: Radiation Oncology

## 2018-07-31 ENCOUNTER — Emergency Department (HOSPITAL_COMMUNITY): Payer: Medicare Other

## 2018-07-31 DIAGNOSIS — C7931 Secondary malignant neoplasm of brain: Secondary | ICD-10-CM | POA: Diagnosis not present

## 2018-07-31 DIAGNOSIS — G9389 Other specified disorders of brain: Secondary | ICD-10-CM | POA: Diagnosis not present

## 2018-07-31 DIAGNOSIS — J449 Chronic obstructive pulmonary disease, unspecified: Secondary | ICD-10-CM | POA: Diagnosis not present

## 2018-07-31 DIAGNOSIS — Z85118 Personal history of other malignant neoplasm of bronchus and lung: Secondary | ICD-10-CM | POA: Insufficient documentation

## 2018-07-31 DIAGNOSIS — R112 Nausea with vomiting, unspecified: Secondary | ICD-10-CM | POA: Diagnosis not present

## 2018-07-31 DIAGNOSIS — E440 Moderate protein-calorie malnutrition: Secondary | ICD-10-CM | POA: Diagnosis not present

## 2018-07-31 DIAGNOSIS — D630 Anemia in neoplastic disease: Secondary | ICD-10-CM | POA: Diagnosis not present

## 2018-07-31 DIAGNOSIS — I10 Essential (primary) hypertension: Secondary | ICD-10-CM | POA: Insufficient documentation

## 2018-07-31 DIAGNOSIS — F17210 Nicotine dependence, cigarettes, uncomplicated: Secondary | ICD-10-CM | POA: Diagnosis not present

## 2018-07-31 DIAGNOSIS — Z8546 Personal history of malignant neoplasm of prostate: Secondary | ICD-10-CM | POA: Insufficient documentation

## 2018-07-31 DIAGNOSIS — R918 Other nonspecific abnormal finding of lung field: Secondary | ICD-10-CM | POA: Diagnosis not present

## 2018-07-31 DIAGNOSIS — R569 Unspecified convulsions: Secondary | ICD-10-CM | POA: Diagnosis not present

## 2018-07-31 DIAGNOSIS — Z79899 Other long term (current) drug therapy: Secondary | ICD-10-CM | POA: Diagnosis not present

## 2018-07-31 LAB — BASIC METABOLIC PANEL
Anion gap: 11 (ref 5–15)
BUN: 40 mg/dL — ABNORMAL HIGH (ref 8–23)
CHLORIDE: 97 mmol/L — AB (ref 98–111)
CO2: 27 mmol/L (ref 22–32)
Calcium: 8.8 mg/dL — ABNORMAL LOW (ref 8.9–10.3)
Creatinine, Ser: 1.39 mg/dL — ABNORMAL HIGH (ref 0.61–1.24)
GFR calc non Af Amer: 46 mL/min — ABNORMAL LOW (ref >60)
GFR, EST AFRICAN AMERICAN: 53 mL/min — AB (ref >60)
Glucose, Bld: 143 mg/dL — ABNORMAL HIGH (ref 70–99)
POTASSIUM: 4.7 mmol/L (ref 3.5–5.1)
Sodium: 135 mmol/L (ref 135–145)

## 2018-07-31 LAB — CBC WITH DIFFERENTIAL/PLATELET
Basophils Absolute: 0 10*3/uL (ref 0.0–0.1)
Basophils Relative: 0 %
Eosinophils Absolute: 0 10*3/uL (ref 0.0–0.7)
Eosinophils Relative: 0 %
HEMATOCRIT: 37.8 % — AB (ref 39.0–52.0)
HEMOGLOBIN: 13.1 g/dL (ref 13.0–17.0)
LYMPHS PCT: 4 %
Lymphs Abs: 0.4 10*3/uL — ABNORMAL LOW (ref 0.7–4.0)
MCH: 31.8 pg (ref 26.0–34.0)
MCHC: 34.7 g/dL (ref 30.0–36.0)
MCV: 91.7 fL (ref 78.0–100.0)
MONOS PCT: 6 %
Monocytes Absolute: 0.5 10*3/uL (ref 0.1–1.0)
NEUTROS ABS: 7.6 10*3/uL (ref 1.7–7.7)
NEUTROS PCT: 90 %
Platelets: 136 10*3/uL — ABNORMAL LOW (ref 150–400)
RBC: 4.12 MIL/uL — ABNORMAL LOW (ref 4.22–5.81)
RDW: 15.7 % — ABNORMAL HIGH (ref 11.5–15.5)
WBC: 8.5 10*3/uL (ref 4.0–10.5)

## 2018-07-31 MED ORDER — ALUM & MAG HYDROXIDE-SIMETH 200-200-20 MG/5ML PO SUSP
30.0000 mL | Freq: Once | ORAL | Status: AC
  Administered 2018-07-31: 30 mL via ORAL
  Filled 2018-07-31: qty 30

## 2018-07-31 MED ORDER — DEXAMETHASONE 4 MG PO TABS: 4.0000 mg | ORAL_TABLET | Freq: Four times a day (QID) | ORAL | 0 refills | Status: DC

## 2018-07-31 NOTE — Progress Notes (Signed)
  Radiation Oncology         [phone]) [phone] ________________________________  Name: Wyatt Stein MRN: [id number]  Date: 07/30/2018  DOB: 22-Dec-1933   SPECIAL TREATMENT PROCEDURE   3D TREATMENT PLANNING AND DOSIMETRY: The patient's radiation plan was reviewed and approved by Dr. Vertell Limber from neurosurgery and radiation oncology prior to treatment. It showed 3-dimensional radiation distributions overlaid onto the planning CT/MRI image set. The [facility] for the target structures as well as the organs at risk were reviewed. The documentation of the 3D plan and dosimetry are filed in the radiation oncology EMR.   NARRATIVE: The patient was brought to the TrueBeam stereotactic radiation treatment machine and placed supine on the CT couch. The head frame was applied, and the patient was set up for stereotactic radiosurgery. Neurosurgery was present for the set-up and delivery   SIMULATION VERIFICATION: In the couch zero-angle position, the patient underwent Exactrac imaging using the Brainlab system with orthogonal KV images. These were carefully aligned and repeated to confirm treatment position for each of the isocenters. The Exactrac snap film verification was repeated at each couch angle.   SPECIAL TREATMENT PROCEDURE: The patient received stereotactic radiosurgery to the following targets:  PTV1 and PTV2 targets were treated using 6 Arcs to a prescription dose of 18 Gy for PTV1 and 20 Gy to PTV2. ExacTrac Snap verification was performed for each couch angle.    STEREOTACTIC TREATMENT MANAGEMENT: Following delivery, the patient was transported to nursing in stable condition and monitored for possible acute effects. Vital signs were recorded . The patient tolerated treatment without significant acute effects, and was discharged to home in stable condition.  PLAN: Follow-up in one month.   ------------------------------------------------  Jodelle Gross, MD, PhD

## 2018-07-31 NOTE — Progress Notes (Signed)
  Radiation Oncology         [phone] ________________________________  Name: Wyatt Stein MRN: [id number]  Date: 07/25/2018  DOB: [birth year]  DIAGNOSIS:     ICD-10-CM   1. Brain metastases ([place]) C79.31     NARRATIVE:  The patient was brought to the [facility].  Identity was confirmed.  All relevant records and images related to the planned course of therapy were reviewed.  The patient freely provided informed written consent to proceed with treatment after reviewing the details related to the planned course of therapy. The consent form was witnessed and verified by the simulation staff. Intravenous access was established for contrast administration. Then, the patient was set-up in a stable reproducible supine position for radiation therapy.  A relocatable thermoplastic stereotactic head frame was fabricated for precise immobilization.  CT images were obtained.  Surface markings were placed.  The CT images were loaded into the planning software and fused with the patient's targeting MRI scan.  Then the target and avoidance structures were contoured.  Treatment planning then occurred.  The radiation prescription was entered and confirmed.  I have requested 3D planning  I have requested a DVH of the following structures: Brain stem, brain, left eye, right eye, lenses, optic chiasm, target volumes, uninvolved brain, and normal tissue.    SPECIAL TREATMENT PROCEDURE:  The planned course of therapy using radiation constitutes a special treatment procedure. Special care is required in the management of this patient for the following reasons. This treatment constitutes a Special Treatment Procedure for the following reason: High dose per fraction requiring special monitoring for increased toxicities of treatment including daily imaging.  The special nature of the planned course of radiotherapy will require increased physician supervision and oversight to ensure patient's safety with  optimal treatment outcomes.  PLAN:  The patient will receive 18 Gy in 1 fraction to PTV1 and 20 Gy in 1 fraction to PTV2.   ------------------------------------------------  Jodelle Gross, MD, PhD

## 2018-07-31 NOTE — ED Provider Notes (Signed)
[facility] DEPT Provider Note   CSN: [id number] Arrival date & time: 07/31/18  1900     History   Chief Complaint Chief Complaint  Patient presents with  . Emesis    HPI Wyatt Stein is a 84 y.o. male.  He is here with a recent diagnosis of lung cancer with mets to brain.  He had a primary seizure disorder and was admitted a few weeks ago for a seizure and was found to have a new lung mass and brain metastases.  He received his first brain radiation treatment yesterday.  Afterwards he was given Decadron to take which she is continued with.  Today he had 2 episodes of vomiting this afternoon.  Currently he denies any nausea vomiting headache double vision blurry vision numbness weakness.  No abdominal pain.  The history is provided by the patient and the spouse.  Emesis   This is a new problem. The current episode started 3 to 5 hours ago. Episode frequency: 2. The problem has been resolved. The emesis has an appearance of stomach contents. There has been no fever. Pertinent negatives include no abdominal pain, no chills, no cough, no diarrhea, no fever, no headaches and no URI. Risk factors: radiation.    Past Medical History:  Diagnosis Date  . Cancer [facility]) 2008   prostate, sees dr [name] once a year  . COPD (chronic obstructive pulmonary disease) ([place])   . GERD (gastroesophageal reflux disease)   . History of blood transfusion 2006  . Hypertension   . PONV (postoperative nausea and vomiting) 40 yrs ago   aspiration pneumonia after tooth extraxtion    Patient Active Problem List   Diagnosis Date Noted  . Bronchogenic cancer of right lung ([place]) 07/19/2018  . Goals of care, counseling/discussion 07/19/2018  . Other constipation 07/19/2018  . Brain metastases ([place]) 07/13/2018  . Cigarette smoker 06/27/2018  . Lung mass 06/26/2018  . Memory changes 08/08/2017  . Localization-related idiopathic epilepsy and epileptic syndromes with seizures of  localized onset, not intractable, without status epilepticus ([place]) 11/01/2015  . Edema 09/05/2015  . COPD GOLD 0/ still smoking 08/27/2015  . Postoperative anemia due to acute blood loss 08/27/2015  . Vitamin D deficiency 08/27/2015  . Malnutrition of moderate degree 08/20/2015  . Convulsions ([place])   . Syncope and collapse 08/15/2015  . Anemia of chronic disease 08/15/2015  . Hypokalemia 08/15/2015  . Dehydration 08/15/2015  . Diarrhea 08/15/2015  . QT prolongation 08/15/2015  . Alcohol abuse 08/15/2015  . Anxiety 08/15/2015  . Syncope 08/15/2015  . Closed left hip fracture ([place])   . Seizure [facility])     Past Surgical History:  Procedure Laterality Date  . back fracture surgery  Lumbar 2006   screws and , back brace prn  . BACK SURGERY    . COLONOSCOPY WITH PROPOFOL N/A 07/01/2013   Procedure: COLONOSCOPY WITH PROPOFOL;  Surgeon: G[place]an Fair, MD;  Location: [facility] ENDOSCOPY;  Service: Endoscopy;  Laterality: N/A;  . ESOPHAGOGASTRODUODENOSCOPY (EGD) WITH PROPOFOL N/A 07/01/2013   Procedure: ESOPHAGOGASTRODUODENOSCOPY (EGD) WITH PROPOFOL;  Surgeon: Garlan Fair, MD;  Location: [facility] ENDOSCOPY;  Service: Endoscopy;  Laterality: N/A;  . INTRAMEDULLARY (IM) NAIL INTERTROCHANTERIC Left 08/16/2015   Procedure: INTRAMEDULLARY (IM) NAIL INTERTROCHANTRIC;  Surgeon: Mcarthur Rossetti, MD;  Location: [facility] ORS;  Service: Orthopedics;  Laterality: Left;  . seed implant for prostate cancer    . TONSILLECTOMY    . VIDEO BRONCHOSCOPY Bilateral 07/02/2018   Procedure: VIDEO BRONCHOSCOPY  WITH FLUORO;  Surgeon: Tanda Rockers, MD;  Location: [facility];  Service: Cardiopulmonary;  Laterality: Bilateral;        Home Medications    Prior to Admission medications   Medication Sig Start Date End Date Taking? Authorizing Provider  amLODipine (NORVASC) 5 MG tablet Take 5 mg by mouth daily. 01/18/18   [provider]  benzonatate (TESSALON) 200 MG capsule Take 1 capsule by mouth 3 (three)  times daily as needed for cough.  06/19/18   [provider]  cholecalciferol (VITAMIN D) 1000 UNITS tablet Take 1,000 Units by mouth daily.    [provider]  [employer] B-1 100 MG tablet Take 100 mg by mouth daily. 07/17/15   [provider]  dexamethasone (DECADRON) 4 MG tablet Take 1 tablet (4 mg total) by mouth 4 (four) times daily. Taper per instructions 07/31/18   Hayden Pedro, PA-C  latanoprost (XALATAN) 0.005 % ophthalmic solution Place 1 drop into both eyes at bedtime.  10/20/15   [provider]  levETIRAcetam (KEPPRA) 750 MG tablet Take 1 tablet (750 mg total) by mouth 2 (two) times daily. 07/16/18   Aline August, MD  Magnesium 250 MG TABS Take 250 mg by mouth daily.     [provider]  Multiple Vitamins-Minerals (CENTRUM SILVER ADULT 50+) TABS Take 1 tablet by mouth daily.    [provider]  nystatin (MYCOSTATIN) 100000 UNIT/ML suspension Take 5 mLs (500,000 Units total) by mouth 4 (four) times daily. 07/25/18   Hayden Pedro, PA-C  vitamin B-12 (CYANOCOBALAMIN) 1000 MCG tablet Take 1,000 mcg by mouth daily.    [provider]    Family History Family History  Problem Relation Age of Onset  . Heart Problems Mother   . Colon cancer Father     Social History Social History   Tobacco Use  . Smoking status: Current Every Day Smoker    Packs/day: 0.50    Years: 60.00    Pack years: 30.00    Types: Cigarettes  . Smokeless tobacco: Never Used  Substance Use Topics  . Alcohol use: No    Alcohol/week: 14.0 standard drinks    Types: 14 Shots of liquor per week    Comment: Former--pt states none since Oct  . Drug use: No     Allergies   Ace inhibitors and Ramipril   Review of Systems Review of Systems  Constitutional: Negative for chills and fever.  HENT: Negative for sore throat.   Eyes: Negative for visual disturbance.  Respiratory: Negative for cough and shortness of breath.   Cardiovascular:  Negative for chest pain.  Gastrointestinal: Positive for vomiting. Negative for abdominal pain and diarrhea.  Genitourinary: Negative for dysuria.  Musculoskeletal: Negative for neck pain.  Skin: Negative for rash.  Neurological: Negative for speech difficulty and headaches.     Physical Exam Updated Vital Signs BP (!) 176/90 (BP Location: Right Arm)   Pulse (!) 58   Temp 97.6 F (36.4 C) (Oral)   Resp 15   Ht 5\' 8"  (1.727 m)   Wt 66 kg   SpO2 98%   BMI 22.12 kg/m   Physical Exam  Constitutional: He is oriented to person, place, and time. He appears well-developed and well-nourished.  HENT:  Head: Normocephalic and atraumatic.  Eyes: Conjunctivae are normal.  Neck: Neck supple.  Cardiovascular: Normal rate, regular rhythm and normal heart sounds.  No murmur heard. Pulmonary/Chest: Effort normal and breath sounds normal. No respiratory distress.  Abdominal:  Soft. There is no tenderness.  Musculoskeletal: He exhibits no edema, tenderness or deformity.  Neurological: He is alert and oriented to person, place, and time.  Skin: Skin is warm and dry. Capillary refill takes less than 2 seconds.  Psychiatric: He has a normal mood and affect.  Nursing note and vitals reviewed.    ED Treatments / Results  Labs (all labs ordered are listed, but only abnormal results are displayed) Labs Reviewed  BASIC METABOLIC PANEL - Abnormal; Notable for the following components:      Result Value   Chloride 97 (*)    Glucose, Bld 143 (*)    BUN 40 (*)    Creatinine, Ser 1.39 (*)    Calcium 8.8 (*)    GFR calc non Af Amer 46 (*)    GFR calc Af Amer 53 (*)    All other components within normal limits  CBC WITH DIFFERENTIAL/PLATELET - Abnormal; Notable for the following components:   RBC 4.12 (*)    HCT 37.8 (*)    RDW 15.7 (*)    Platelets 136 (*)    Lymphs Abs 0.4 (*)    All other components within normal limits    EKG None  Radiology Ct Head Wo Contrast  Result Date:  07/31/2018 CLINICAL DATA:  Nausea and vomiting after radiation yesterday. EXAM: CT HEAD WITHOUT CONTRAST TECHNIQUE: Contiguous axial images were obtained from the base of the skull through the vertex without intravenous contrast. COMPARISON:  CT head 07/13/2018. MRI brain 07/24/2018. FINDINGS: Brain: Circumscribed hyperdense mass again demonstrated in the right basal ganglia measuring 2.7 x 1.7 cm. Large amount of surrounding white matter edema. Focal sub falcine right to left midline shift of about 7 mm. Effacement of the right lateral ventricles. Similar appearance to previous study. Underlying cerebral atrophy and ventricular dilatation consistent with central atrophy. No new intracranial hemorrhage. Vascular: Mild intracranial arterial vascular calcifications are present. Skull: Calvarium appears intact. Sinuses/Orbits: Paranasal sinuses and mastoid air cells are clear. Other: None. IMPRESSION: 1. Circumscribed hyperdense mass in the right basal ganglia measuring 2.7 x 1.7 cm with large amount of surrounding white matter edema. Focal sub falcine right to left midline shift of about 7 mm. No significant change since prior study. 2. No new intracranial hemorrhage. Electronically Signed   By: Lucienne Capers M.D.   On: 07/31/2018 21:53    Procedures Procedures (including critical care time)  Medications Ordered in ED Medications - No data to display   Initial Impression / Assessment and Plan / ED Course  I have reviewed the triage vital signs and the nursing notes.  Pertinent labs & imaging results that were available during my care of the patient were reviewed by me and considered in my medical decision making (see chart for details).  Clinical Course as of Aug 01 1157  Wed Jul 31, 2018  2159 CT with no significant change.  Anticipate discharge.   [MB]    Clinical Course User Index [MB] Hayden Rasmussen, MD    Final Clinical Impressions(s) / ED Diagnoses   Final diagnoses:    Non-intractable vomiting with nausea, unspecified vomiting type    ED Discharge Orders    None       Hayden Rasmussen, MD 08/01/18 1159

## 2018-07-31 NOTE — Discharge Instructions (Addendum)
You were evaluated in the emergency department for nausea and vomiting.  This is likely related to the brain radiation that you had yesterday.  You should continue your Decadron as prescribed.  Please call Dr. Ida Rogue office tomorrow and update them on how you are doing.

## 2018-07-31 NOTE — ED Triage Notes (Signed)
Pt arrived with complaints of nausea and vomiting post radiation yesterday, family was informed to come to ED. Pt has had two episodes of vomiting.

## 2018-08-01 ENCOUNTER — Telehealth: Payer: Self-pay

## 2018-08-01 ENCOUNTER — Telehealth: Payer: Self-pay | Admitting: Radiation Therapy

## 2018-08-01 DIAGNOSIS — R51 Headache: Secondary | ICD-10-CM | POA: Diagnosis not present

## 2018-08-01 DIAGNOSIS — S0101XA Laceration without foreign body of scalp, initial encounter: Secondary | ICD-10-CM | POA: Diagnosis not present

## 2018-08-01 NOTE — Telephone Encounter (Signed)
Received voicemail from Herbert Deaner, PT with [facility]. He received a message from the patients wife that Mr. Applegate fell, hit his head and is bleeding. Herbert Deaner instructed his wife to take him to the ER to be evaluated.

## 2018-08-01 NOTE — Telephone Encounter (Signed)
Called Wyatt Stein back in reference to Wyatt Stein falling. There was no answer, so I left a detailed message.    Previous phone call discussion:  While Wyatt Stein was out for a dental appointment, Wyatt Stein fell and "busted the back of his head". She said that he was bleeding quite a bit, but the bleeding had stopped and she was getting him cleaned up. She did not want to return to the ED because they had just been there the day before to treat his nausea and vomiting.   I shared this with Wyatt Stein and Wyatt Stein nurse. There is a concern that he could have some internal bleeding from injury to the head from his fall. Based on Wyatt Stein not wanting to return to the hospital, instructions were given to keep a very close eye on Wyatt Stein. If the nausea and vomiting returns or she notices any other change, we encourage her to take him to the ED to be evaluated.    Wyatt Stein R.T.(R)(T) Special Procedures Navigator

## 2018-08-02 ENCOUNTER — Telehealth: Payer: Self-pay | Admitting: Radiation Therapy

## 2018-08-02 NOTE — Telephone Encounter (Signed)
Called to check in on Wyatt Stein before the weekend. Spoke with his wife, Wyatt Stein. She did end up taking him to [facility] to get him checked out after his fall. He was given a few stitches in his scalp to stop the bleeding. Overall he is feeling much better today. She said that he is eating well and doing better.  Mont Dutton R.T.(R)(T) Special Procedures Navigator  [phone]

## 2018-08-05 ENCOUNTER — Telehealth: Payer: Self-pay

## 2018-08-05 DIAGNOSIS — E440 Moderate protein-calorie malnutrition: Secondary | ICD-10-CM | POA: Diagnosis not present

## 2018-08-05 DIAGNOSIS — J449 Chronic obstructive pulmonary disease, unspecified: Secondary | ICD-10-CM | POA: Diagnosis not present

## 2018-08-05 DIAGNOSIS — C7931 Secondary malignant neoplasm of brain: Secondary | ICD-10-CM | POA: Diagnosis not present

## 2018-08-05 DIAGNOSIS — R918 Other nonspecific abnormal finding of lung field: Secondary | ICD-10-CM | POA: Diagnosis not present

## 2018-08-05 DIAGNOSIS — D630 Anemia in neoplastic disease: Secondary | ICD-10-CM | POA: Diagnosis not present

## 2018-08-05 DIAGNOSIS — R569 Unspecified convulsions: Secondary | ICD-10-CM | POA: Diagnosis not present

## 2018-08-05 NOTE — Telephone Encounter (Signed)
Pt's dtr, Glee Arvin, lvm this morning stating she had some questions and concerns r/t pt's fall at home last week.  Returned call to number provided.  No name on vm to identify recipient therefore no vm left.   Pt does have an appt with Dr Alvy Bimler tomorrow.

## 2018-08-06 ENCOUNTER — Inpatient Hospital Stay (HOSPITAL_BASED_OUTPATIENT_CLINIC_OR_DEPARTMENT_OTHER): Payer: Medicare Other | Admitting: Hematology and Oncology

## 2018-08-06 ENCOUNTER — Encounter: Payer: Self-pay | Admitting: Hematology and Oncology

## 2018-08-06 ENCOUNTER — Telehealth: Payer: Self-pay | Admitting: Hematology and Oncology

## 2018-08-06 VITALS — BP 125/56 | HR 63 | Temp 98.4°F | Resp 18 | Ht 68.0 in | Wt 142.6 lb

## 2018-08-06 DIAGNOSIS — F17210 Nicotine dependence, cigarettes, uncomplicated: Secondary | ICD-10-CM | POA: Diagnosis not present

## 2018-08-06 DIAGNOSIS — S0101XD Laceration without foreign body of scalp, subsequent encounter: Secondary | ICD-10-CM | POA: Diagnosis not present

## 2018-08-06 DIAGNOSIS — Z0000 Encounter for general adult medical examination without abnormal findings: Secondary | ICD-10-CM

## 2018-08-06 DIAGNOSIS — J449 Chronic obstructive pulmonary disease, unspecified: Secondary | ICD-10-CM | POA: Diagnosis not present

## 2018-08-06 DIAGNOSIS — Z79899 Other long term (current) drug therapy: Secondary | ICD-10-CM | POA: Diagnosis not present

## 2018-08-06 DIAGNOSIS — C7931 Secondary malignant neoplasm of brain: Secondary | ICD-10-CM

## 2018-08-06 DIAGNOSIS — R51 Headache: Secondary | ICD-10-CM | POA: Diagnosis not present

## 2018-08-06 DIAGNOSIS — G40909 Epilepsy, unspecified, not intractable, without status epilepticus: Secondary | ICD-10-CM

## 2018-08-06 DIAGNOSIS — R531 Weakness: Secondary | ICD-10-CM | POA: Diagnosis not present

## 2018-08-06 DIAGNOSIS — K5909 Other constipation: Secondary | ICD-10-CM

## 2018-08-06 DIAGNOSIS — Z23 Encounter for immunization: Secondary | ICD-10-CM | POA: Diagnosis not present

## 2018-08-06 DIAGNOSIS — C3491 Malignant neoplasm of unspecified part of right bronchus or lung: Secondary | ICD-10-CM | POA: Diagnosis not present

## 2018-08-06 DIAGNOSIS — C3411 Malignant neoplasm of upper lobe, right bronchus or lung: Secondary | ICD-10-CM | POA: Diagnosis not present

## 2018-08-06 DIAGNOSIS — Z510 Encounter for antineoplastic radiation therapy: Secondary | ICD-10-CM | POA: Diagnosis not present

## 2018-08-06 DIAGNOSIS — R110 Nausea: Secondary | ICD-10-CM | POA: Insufficient documentation

## 2018-08-06 MED ORDER — PROCHLORPERAZINE MALEATE 10 MG PO TABS: 10.0000 mg | ORAL_TABLET | Freq: Four times a day (QID) | ORAL | 3 refills | Status: DC | PRN

## 2018-08-06 MED ORDER — INFLUENZA VAC SPLIT QUAD 0.5 ML IM SUSY
0.5000 mL | PREFILLED_SYRINGE | Freq: Once | INTRAMUSCULAR | Status: AC
  Administered 2018-08-06: 0.5 mL via INTRAMUSCULAR

## 2018-08-06 MED ORDER — NICOTINE POLACRILEX 2 MG MT GUM: 2.0000 mg | CHEWING_GUM | OROMUCOSAL | 0 refills | Status: DC | PRN

## 2018-08-06 MED ORDER — INFLUENZA VAC SPLIT QUAD 0.5 ML IM SUSY
PREFILLED_SYRINGE | INTRAMUSCULAR | Status: AC
  Filled 2018-08-06: qty 0.5

## 2018-08-06 MED ORDER — ONDANSETRON HCL 8 MG PO TABS: 8.0000 mg | ORAL_TABLET | Freq: Three times a day (TID) | ORAL | 3 refills | Status: DC | PRN

## 2018-08-06 NOTE — Assessment & Plan Note (Signed)
I advised him to quit smoking. We discussed the importance of preventive care and reviewed the vaccination programs. He does not have any prior allergic reactions to influenza vaccination. He agrees to proceed with influenza vaccination today and we will administer it today at the clinic.

## 2018-08-06 NOTE — Op Note (Signed)
  Name: MATEJ SAPPENFIELD  MRN: [id number]  Date: 07/30/2018   DOB: [birth year]  Stereotactic Radiosurgery Operative Note  PRE-OPERATIVE DIAGNOSIS:  Multiple Brain Metastases  POST-OPERATIVE DIAGNOSIS:  Multiple Brain Metastases  PROCEDURE:  Stereotactic Radiosurgery  SURGEON:  Peggyann Shoals, MD  NARRATIVE: The patient underwent a radiation treatment planning session in the radiation oncology simulation suite under the care of the radiation oncology physician and physicist.  I participated closely in the radiation treatment planning afterwards. The patient underwent planning CT which was fused to 3T high resolution MRI with 1 mm axial slices.  These images were fused on the planning system.  We contoured the gross target volumes and subsequently expanded this to yield the Planning Target Volume. I actively participated in the planning process.  I helped to define and review the target contours and also the contours of the optic pathway, eyes, brainstem and selected nearby organs at risk.  All the dose constraints for critical structures were reviewed and compared to AAPM Task Group 101.  The prescription dose conformity was reviewed.  I approved the plan electronically.    Accordingly, Wyatt Stein was brought to the TrueBeam stereotactic radiation treatment linac and placed in the custom immobilization mask.  The patient was aligned according to the IR fiducial markers with BrainLab Exactrac, then orthogonal x-rays were used in ExacTrac with the 6DOF robotic table and the shifts were made to align the patient  Wyatt Stein received stereotactic radiosurgery uneventfully.    Lesions treated:  2   Complex lesions treated:  0 (>3.5 cm, <104mm of optic path, or within the brainstem)   The detailed description of the procedure is recorded in the radiation oncology procedure note.  I was present for the duration of the procedure.  DISPOSITION:  Following delivery, the patient was transported to  nursing in stable condition and monitored for possible acute effects to be discharged to home in stable condition with follow-up in one month.  Peggyann Shoals, MD 08/06/2018 11:51 AM

## 2018-08-06 NOTE — Progress Notes (Signed)
[place] OFFICE PROGRESS NOTE  Patient Care Team: Wenda Low, MD as PCP - General (Internal Medicine)  ASSESSMENT & PLAN:  Bronchogenic cancer of right lung [facility]) He is receiving radiation therapy to the brain and the chest Continue supportive care I plan to see him in 2 weeks at the end of his radiation therapy to plan on systemic chemotherapy.  Brain metastases ([place]) He had recent seizure, falls and profound weakness We discussed the importance of physical therapy and rehab.  Nausea without vomiting He had recent nausea I recommend antiemetics before radiation treatment  COPD GOLD 0/ still smoking I advised him to quit smoking. We discussed the importance of preventive care and reviewed the vaccination programs. He does not have any prior allergic reactions to influenza vaccination. He agrees to proceed with influenza vaccination today and we will administer it today at the clinic.   Cigarette smoker He is still smoking, down to less than 3 cigarettes/day He appears motivated to quit I recommend nicotine gum   No orders of the defined types were placed in this encounter.   INTERVAL HISTORY: Please see below for problem oriented charting. He returns for further follow-up with family He had recent falls He had recurrent seizure and nausea He denies constipation He is still smoking several cigarettes per day He denies recent infection, fever or chills He complained of profound weakness His appetite is fair. Denies pain.  SUMMARY OF ONCOLOGIC HISTORY: Oncology History   Squamous cell     Bronchogenic cancer of right lung ([place])   06/21/2018 Imaging    Large RIGHT upper lobe mass 9.6 x 5.5 x 8.4 cm in size consistent with a primary pulmonary neoplasm.  Mass abuts the anterior chest wall, mediastinum and superior RIGHT hilum and demonstrates central necrosis.  Postobstructive pneumonitis versus lymphangitic tumor spread in RIGHT upper  lobe.  Underlying pan lobular emphysema.  Tiny scattered nonspecific ground-glass opacities in the lower lobes.  Mediastinal adenopathy as above.  Aortic Atherosclerosis (ICD10-I70.0) and Emphysema (ICD10-J43.9)    07/02/2018 Pathology Results    Endobronchial biopsy, RUL - COMPLETELY NECROTIC TISSUE, SUGGESTIVE OF A NECROTIC TUMOR.    07/02/2018 Procedure    Bronchoscopy Procedure Note  Date of Operation: 07/02/2018   Pre-op Diagnosis: RUL mass  Post-op Diagnosis: same  Surgeon: Christinia Gully  Anesthesia: Monitored Local Anesthesia with Sedation Time Started:  0814 versed 2.5 mg IV/ demerol 25 mg IV  Time Stopped:  0825  Operation: Video Flexible fiberoptic bronchoscopy, diagnostic   Findings: mass prolapsing out of RUL apical segment completely obst it   Specimen:  BAL rul/ endobronchial bx x 2 plus portion of mass suctioned off   Estimated Blood Loss: min  Complications: none  Indications and History: See updated H and P same date. The risks, benefits, complications, treatment options and expected outcomes were discussed with the patient.  The possibilities of reaction to medication, pulmonary aspiration, perforation of a viscus, bleeding, failure to diagnose a condition and creating a complication requiring transfusion or operation were discussed with the patient who freely signed the consent.    Description of Procedure: The patient was re-examined in the bronchoscopy suite and the site of surgery properly noted/marked.  The patient was identified  and the procedure verified as Flexible Fiberoptic Bronchoscopy.  A Time Out was held and the above information confirmed.   After the induction of topical nasopharyngeal anesthesia, the patient was positioned  and the bronchoscope was passed through the R naris. The vocal  cords were visualized and  1% buffered lidocaine 5 ml was topically placed onto the cords. The cords were nl. The scope was then passed into  the trachea.  1% buffered lidocaine given topically. Airways inspected bilaterally to the subsegmental level with the following findings:  Airways nl x for mass prolapsing out of RUL apical segment completely obst it    Interventions: BAL rul/ endobronchial bx x 2 RUl     07/12/2018 PET scan    1. Hypermetabolic RIGHT upper lobe mass consists primary bronchogenic carcinoma. 2. Ipsilateral hypermetabolic mediastinal nodal metastasis. 3. Metastatic brain lesion in the RIGHT frontal lobe partially imaged. See comparison head CT from 07/13/2018 4. No supraclavicular adenopathy. 5. No evidence of metastatic disease below the diaphragms.    07/13/2018 Imaging    CT head 16 x 18 mm hyperdense mass right basal ganglia with extensive surrounding edema and 7 mm midline shift. Findings most compatible with hemorrhagic metastatic disease. Patient has a large lung mass which is hypermetabolic on PET scan yesterday compatible with lung carcinoma.  Moderate atrophy and moderate chronic microvascular ischemia     07/13/2018 Imaging    MRI brain Enhancing mass lesion right basal ganglia with extensive surrounding edema. Given history of lung cancer this is consistent with metastatic disease. Second 2 mm enhancing lesion in the right frontal lobe also consistent with metastatic disease. There is mass-effect and 7 mm midline shift to the left due to extensive edema.    07/16/2018 Procedure    Status post CT-guided biopsy of right upper lobe mass. Tissue specimen sent to pathology for complete histopathologic analysis.    07/16/2018 Pathology Results    Lung, biopsy, right upper lobe - NON-SMALL CELL CARCINOMA, SEE COMMENT. Microscopic Comment Immunohistochemistry is focally positive for cytokeratin 5/6 and p63. TTF-1 and Napsin A are negative. The immunoprofile slightly favors a squamous cell carcinoma. There is likely sufficient tissue for additional studies if requested (Block 1A). Dr. Lyndon Code has reviewed  the case.    07/19/2018 Cancer Staging    Staging form: Lung, AJCC 8th Edition - Clinical: Stage IV (cT4, cN2, cM1) - Signed by [name], MD on 07/19/2018     REVIEW OF SYSTEMS:   Constitutional: Denies fevers, chills or abnormal weight loss Eyes: Denies blurriness of vision Ears, nose, mouth, throat, and face: Denies mucositis or sore throat Respiratory: Denies cough, dyspnea or wheezes Cardiovascular: Denies palpitation, chest discomfort or lower extremity swelling Skin: Denies abnormal skin rashes Lymphatics: Denies new lymphadenopathy or easy bruising Behavioral/Psych: Mood is stable, no new changes  All other systems were reviewed with the patient and are negative.  I have reviewed the past medical history, past surgical history, social history and family history with the patient and they are unchanged from previous note.  ALLERGIES:  is allergic to ace inhibitors and ramipril.  MEDICATIONS:  Current Outpatient Medications  Medication Sig Dispense Refill  . amLODipine (NORVASC) 5 MG tablet Take 5 mg by mouth daily.  5  . benzonatate (TESSALON) 200 MG capsule Take 1 capsule by mouth 3 (three) times daily as needed for cough.   0  . cholecalciferol (VITAMIN D) 1000 UNITS tablet Take 1,000 Units by mouth daily.    . [employer]S B-1 100 MG tablet Take 100 mg by mouth daily.  11  . dexamethasone (DECADRON) 4 MG tablet Take 1 tablet (4 mg total) by mouth 4 (four) times daily. Taper per instructions 60 tablet 0  . latanoprost (XALATAN) 0.005 % ophthalmic solution Place 1  drop into both eyes at bedtime.   12  . levETIRAcetam (KEPPRA) 750 MG tablet Take 1 tablet (750 mg total) by mouth 2 (two) times daily. 60 tablet 0  . Magnesium 250 MG TABS Take 250 mg by mouth daily.     . Multiple Vitamins-Minerals (CENTRUM SILVER ADULT 50+) TABS Take 1 tablet by mouth daily.    . nicotine polacrilex (CVS NICOTINE POLACRILEX) 2 MG gum Take 1 each (2 mg total) by mouth as needed for smoking cessation. 100  tablet 0  . ondansetron (ZOFRAN) 8 MG tablet Take 1 tablet (8 mg total) by mouth every 8 (eight) hours as needed for nausea. 30 tablet 3  . prochlorperazine (COMPAZINE) 10 MG tablet Take 1 tablet (10 mg total) by mouth every 6 (six) hours as needed for nausea or vomiting. 30 tablet 3  . vitamin B-12 (CYANOCOBALAMIN) 1000 MCG tablet Take 1,000 mcg by mouth daily.     No current facility-administered medications for this visit.     PHYSICAL EXAMINATION: ECOG PERFORMANCE STATUS: 1 - Symptomatic but completely ambulatory  Vitals:   08/06/18 1410  BP: (!) 125/56  Pulse: 63  Resp: 18  Temp: 98.4 F (36.9 C)  SpO2: 99%   Filed Weights   08/06/18 1410  Weight: 142 lb 9.6 oz (64.7 kg)    GENERAL:alert, no distress and comfortable SKIN: skin color, texture, turgor are normal, no rashes or significant lesions EYES: normal, Conjunctiva are pink and non-injected, sclera clear OROPHARYNX:no exudate, no erythema and lips, buccal mucosa, and tongue normal  NECK: supple, thyroid normal size, non-tender, without nodularity LYMPH:  no palpable lymphadenopathy in the cervical, axillary or inguinal LUNGS: clear to auscultation and percussion with normal breathing effort HEART: regular rate & rhythm and no murmurs and no lower extremity edema ABDOMEN:abdomen soft, non-tender and normal bowel sounds Musculoskeletal:no cyanosis of digits and no clubbing  NEURO: alert & oriented x 3 with fluent speech, with mild proximal muscle weakness in his legs.  LABORATORY DATA:  I have reviewed the data as listed    Component Value Date/Time   NA 135 07/31/2018 2025   K 4.7 07/31/2018 2025   CL 97 (L) 07/31/2018 2025   CO2 27 07/31/2018 2025   GLUCOSE 143 (H) 07/31/2018 2025   BUN 40 (H) 07/31/2018 2025   CREATININE 1.39 (H) 07/31/2018 2025   CALCIUM 8.8 (L) 07/31/2018 2025   PROT 6.4 (L) 07/15/2018 0426   ALBUMIN 3.0 (L) 07/15/2018 0426   AST 17 07/15/2018 0426   ALT 9 07/15/2018 0426   ALKPHOS 70  07/15/2018 0426   BILITOT 0.5 07/15/2018 0426   GFRNONAA 46 (L) 07/31/2018 2025   GFRAA 53 (L) 07/31/2018 2025    No results found for: SPEP, UPEP  Lab Results  Component Value Date   WBC 8.5 07/31/2018   NEUTROABS 7.6 07/31/2018   HGB 13.1 07/31/2018   HCT 37.8 (L) 07/31/2018   MCV 91.7 07/31/2018   PLT 136 (L) 07/31/2018      Chemistry      Component Value Date/Time   NA 135 07/31/2018 2025   K 4.7 07/31/2018 2025   CL 97 (L) 07/31/2018 2025   CO2 27 07/31/2018 2025   BUN 40 (H) 07/31/2018 2025   CREATININE 1.39 (H) 07/31/2018 2025      Component Value Date/Time   CALCIUM 8.8 (L) 07/31/2018 2025   ALKPHOS 70 07/15/2018 0426   AST 17 07/15/2018 0426   ALT 9 07/15/2018 0426   BILITOT  0.5 07/15/2018 0426       RADIOGRAPHIC STUDIES: I have personally reviewed the radiological images as listed and agreed with the findings in the report. Dg Chest 2 View  Result Date: 07/13/2018 CLINICAL DATA:  Seizure activity earlier today while sitting outside at home with his wife. 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 EXAM: CHEST - 2 VIEW COMPARISON:  PET-CT yesterday. CT chest 06/21/2018. Chest x-rays 06/20/2018 and earlier. FINDINGS: Cardiac silhouette normal in size, unchanged. Thoracic aorta mildly tortuous, unchanged. Large RIGHT UPPER LOBE lung mass extending into the RIGHT hilum as noted on the recent examinations. Lungs hyperinflated but clear otherwise. No pleural effusions. IMPRESSION: Large RIGHT UPPER LOBE lung mass extending into the RIGHT hilum as noted previously. No new abnormalities. Electronically Signed   By: Evangeline Dakin M.D.   On: 07/13/2018 16:12   Ct Head Wo Contrast  Result Date: 07/31/2018 CLINICAL DATA:  Nausea and vomiting after radiation yesterday. EXAM: CT HEAD WITHOUT CONTRAST TECHNIQUE: Contiguous axial images were obtained from the base of the skull through the vertex without intravenous contrast. COMPARISON:  CT head  07/13/2018. MRI brain 07/24/2018. FINDINGS: Brain: Circumscribed hyperdense mass again demonstrated in the right basal ganglia measuring 2.7 x 1.7 cm. Large amount of surrounding white matter edema. Focal sub falcine right to left midline shift of about 7 mm. Effacement of the right lateral ventricles. Similar appearance to previous study. Underlying cerebral atrophy and ventricular dilatation consistent with central atrophy. No new intracranial hemorrhage. Vascular: Mild intracranial arterial vascular calcifications are present. Skull: Calvarium appears intact. Sinuses/Orbits: Paranasal sinuses and mastoid air cells are clear. Other: None. IMPRESSION: 1. Circumscribed hyperdense mass in the right basal ganglia measuring 2.7 x 1.7 cm with large amount of surrounding white matter edema. Focal sub falcine right to left midline shift of about 7 mm. No significant change since prior study. 2. No new intracranial hemorrhage. Electronically Signed   By: Lucienne Capers M.D.   On: 07/31/2018 21:53   Ct Head Wo Contrast  Result Date: 07/13/2018 CLINICAL DATA:  New onset seizure EXAM: CT HEAD WITHOUT CONTRAST TECHNIQUE: Contiguous axial images were obtained from the base of the skull through the vertex without intravenous contrast. COMPARISON:  MRI head 09/05/2013, 03/25/2012 FINDINGS: Brain: Hyperdense mass in the right basal ganglia measures 16 x 18 mm with extensive surrounding edema. Findings most compatible with hemorrhagic metastasis with vasogenic edema. 7 mm of midline shift. No other mass lesions are seen on unenhanced imaging. Moderate atrophy and moderate chronic microvascular ischemic change in the white matter. Vascular: Negative for hyperdense vessel Skull: Negative Sinuses/Orbits: Negative Other: None IMPRESSION: 16 x 18 mm hyperdense mass right basal ganglia with extensive surrounding edema and 7 mm midline shift. Findings most compatible with hemorrhagic metastatic disease. Patient has a large lung mass  which is hypermetabolic on PET scan yesterday compatible with lung carcinoma. Moderate atrophy and moderate chronic microvascular ischemia These results were called by telephone at the time of interpretation on 07/13/2018 at 4:03 pm to Dr. Nanda Quinton , who verbally acknowledged these results. Electronically Signed   By: Franchot Gallo M.D.   On: 07/13/2018 16:04   Mr Jeri Cos VO Contrast  Addendum Date: 07/30/2018   ADDENDUM REPORT: 07/30/2018 19:09 ADDENDUM: The dural-based lesion adjacent to the frontal lobe is on the left, not the right. It is stable in measurement since 2014 at 5 x 9 x 10 mm. This most likely represents a meningioma. The 2 right frontal lobe lesions  are worrisome for metastatic disease. Electronically Signed   By: San Morelle M.D.   On: 07/30/2018 19:09   Result Date: 07/30/2018 CLINICAL DATA:  Lung cancer. 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 EXAM: MRI HEAD WITHOUT AND WITH CONTRAST TECHNIQUE: Multiplanar, multiecho pulse sequences of the brain and surrounding structures were obtained without and with intravenous contrast. CONTRAST:  55m MULTIHANCE GADOBENATE DIMEGLUMINE 529 MG/ML IV SOLN COMPARISON:  MRI brain 07/13/2018. FINDINGS: BRAIN New Lesions: Initial diagnosis of 2 lesions. 27 x 22 x 19 mm enhancing lesion located the anterior right basal ganglia. Lesion centered at caudate head anterior limb capsule with major surrounding edema and moderate mass effect seen on series 10, image 83. 2 mm enhancing lesion located more anteriorly within right frontal lobe white matter with major surrounding edema and no mass effect seen on series 10, image 83. Other Brain findings: A dural-based lesion anteriorly in the right frontal lobe measures 5 x 9 x 10 mm. Extensive vasogenic edema surrounding the right basal ganglia lesion results in significant mass effect with 6 mm right to left subfalcine herniation and midline shift. No acute infarct or hemorrhage is  present. Moderate periventricular and subcortical white matter changes are present bilaterally Vascular: Flow is present in the major intracranial arteries. Skull and upper cervical spine: The skull base is within normal limits. The craniocervical junction is normal. Degenerative changes are present at C3-4 greater than C2-3. Marrow signal is within normal limits otherwise. Sinuses/Orbits: The paranasal sinuses mastoid air cells are clear. Globes and orbits are within normal limits. IMPRESSION: 1. Total of 2 enhancing brain metastases, each annotated on series. 2. The dominant lesion is in the anterior basal ganglia involving the caudate head and anterior limb internal capsule measuring at 27 x 22 x 19 mm. This likely represents metastatic lesion is patient with known lung cancer. 3. 2 mm anterior right frontal lobe lesion also likely represents focal metastasis. 4. Significant vasogenic edema surrounds the dominant lesion with mass effect including 6 mm of right-to-left subfalcine herniation and midline shift 5. Acute intracranial abnormality in the interval. 6. Extensive periventricular and subcortical white matter changes bilaterally likely reflects the sequela of chronic microvascular ischemia. Electronically Signed: By: CSan MorelleM.D. On: 07/24/2018 14:42   Mr BJeri CosWZOContrast  Result Date: 07/13/2018 CLINICAL DATA:  Metastatic lung cancer staging EXAM: MRI HEAD WITHOUT AND WITH CONTRAST TECHNIQUE: Multiplanar, multiecho pulse sequences of the brain and surrounding structures were obtained without and with intravenous contrast. CONTRAST:  182mMULTIHANCE GADOBENATE DIMEGLUMINE 529 MG/ML IV SOLN COMPARISON:  CT head 07/13/2018 FINDINGS: Brain: Enhancing mass lesion in the right basal ganglia measures 22 x 17 x 21 mm. The mass has irregular enhancement with irregular contour to the lesion. Large amount of surrounding edema. Lesion is hyperdense on CT suggesting hemorrhage however MRI does not  confirm hemorrhage in the lesion. 2 mm enhancing nodule in the right frontal lobe within the white matter edema. This is consistent with a second brain metastasis. No other enhancing nodules identified. Extensive edema surrounding the right basal ganglia lesion with mass-effect on the lateral ventricle. 7 mm midline shift to the left. Generalized atrophy. Chronic microvascular ischemic changes in the white matter. Negative for acute infarct. Vascular: Normal arterial flow voids Skull and upper cervical spine: Negative Sinuses/Orbits: Negative Other: None IMPRESSION: Enhancing mass lesion right basal ganglia with extensive surrounding edema. Given history of lung cancer this is consistent with metastatic disease. Second 2 mm enhancing lesion  in the right frontal lobe also consistent with metastatic disease. There is mass-effect and 7 mm midline shift to the left due to extensive edema. Electronically Signed   By: Franchot Gallo M.D.   On: 07/13/2018 20:18   Nm Pet Image Initial (pi) Skull Base To Thigh  Result Date: 07/13/2018 CLINICAL DATA:  Initial treatment strategy for lung mass. EXAM: NUCLEAR MEDICINE PET SKULL BASE TO THIGH TECHNIQUE: 6.7 mCi F-18 FDG was injected intravenously. Full-ring PET imaging was performed from the skull base to thigh after the radiotracer. CT data was obtained and used for attenuation correction and anatomic localization. Fasting blood glucose: 99 mg/dl COMPARISON:  Chest CT 06/21/2018 FINDINGS: Mediastinal blood pool activity: SUV max 2.2 NECK: High-density lesion in the medial RIGHT frontal lobe has hypermetabolic activity above background white matter consistent with brain metastasis. No hypermetabolic lymph nodes in the neck. Incidental CT findings: none CHEST: Large RIGHT upper lobe mass measuring 8 cm is intensely hypermetabolic with SUV max 18. Portions of hypermetabolic central necrosis in the posterolateral aspect of mass. Enlarged hypermetabolic RIGHT lower paratracheal  lymph node. No hypermetabolic supraclavicular nodes Incidental CT findings: Coronary artery calcification and aortic atherosclerotic calcification. ABDOMEN/PELVIS: No abnormal hypermetabolic activity within the liver, pancreas, adrenal glands, or spleen. No hypermetabolic lymph nodes in the abdomen or pelvis. Incidental CT findings: Brachytherapy seeds the prostate gland SKELETON: No focal hypermetabolic activity to suggest skeletal metastasis. Incidental CT findings: Posterior lumbar fusion IMPRESSION: 1. Hypermetabolic RIGHT upper lobe mass consists primary bronchogenic carcinoma. 2. Ipsilateral hypermetabolic mediastinal nodal metastasis. 3. Metastatic brain lesion in the RIGHT frontal lobe partially imaged. See comparison head CT from 07/13/2018 4. No supraclavicular adenopathy. 5. No evidence of metastatic disease below the diaphragms. Electronically Signed   By: Suzy Bouchard M.D.   On: 07/13/2018 16:14   Ct Biopsy  Result Date: 07/16/2018 INDICATION: 84 year old male with a history of FDG avid right upper lobe mass EXAM: CT BIOPSY MEDICATIONS: None. ANESTHESIA/SEDATION: Moderate (conscious) sedation was employed during this procedure. A total of Versed 2.0 mg and Fentanyl 100 mcg was administered intravenously. Moderate Sedation Time: 20 minutes. The patient's level of consciousness and vital signs were monitored continuously by radiology nursing throughout the procedure under my direct supervision. FLUOROSCOPY TIME:  CT COMPLICATIONS: None PROCEDURE: The procedure, risks, benefits, and alternatives were explained to the patient and the patient's family. Specific risks that were addressed included bleeding, infection, pneumothorax, need for further procedure including chest tube placement, chance of delayed pneumothorax or hemorrhage, hemoptysis, nondiagnostic sample, cardiopulmonary collapse, death. Questions regarding the procedure were encouraged and answered. The patient understands and consents to  the procedure. Patient was positioned in the supine position on the CT gantry table and a scout CT of the chest was performed for planning purposes. Once angle of approach was determined, the skin and subcutaneous tissues this scan was prepped and draped in the usual sterile fashion, and a sterile drape was applied covering the operative field. A sterile gown and sterile gloves were used for the procedure. Local anesthesia was provided with 1% Lidocaine. The skin and subcutaneous tissues were infiltrated 1% lidocaine for local anesthesia, and a small stab incision was made with an 11 blade scalpel. Using CT guidance, a 17 gauge trocar needle was advanced into the right upper lobetarget. After confirmation of the tip, separate 18 gauge core biopsies were performed. These were placed into solution for transportation to the lab. Biosentry Device was deployed. A final CT image was performed. Patient tolerated the procedure  well and remained hemodynamically stable throughout. No complications were encountered and no significant blood loss was encounter IMPRESSION: Status post CT-guided biopsy of right upper lobe mass. Tissue specimen sent to pathology for complete histopathologic analysis. Signed, Dulcy Fanny. Dellia Nims, RPVI [facility] Electronically Signed   By: Corrie Mckusick D.O.   On: 07/16/2018 09:50   Dg Chest Port 1 View  Result Date: 07/16/2018 CLINICAL DATA:  Follow-up post CT directed biopsy of the right upper lobe mass. EXAM: PORTABLE CHEST 1 VIEW COMPARISON:  Chest x-ray of July 13, 2018 and chest CT scan from today's biopsy. FINDINGS: The soft tissue mass in the right upper lobe appears stable. No postprocedure pneumothorax or hemothorax is observed. The left lung is clear. The heart and pulmonary vascularity are normal. There is tortuosity of the ascending and descending thoracic aorta. The bony thorax exhibits no acute abnormality. IMPRESSION:  There is no postprocedure complication following CT directed right upper lobe lung mass biopsy. Electronically Signed   By: David  Martinique M.D.   On: 07/16/2018 10:31    All questions were answered. The patient knows to call the clinic with any problems, questions or concerns. No barriers to learning was detected.  I spent 25 minutes counseling the patient face to face. The total time spent in the appointment was 30 minutes and more than 50% was on counseling and review of test results  [name], MD 08/06/2018 5:51 PM

## 2018-08-06 NOTE — Assessment & Plan Note (Signed)
He is receiving radiation therapy to the brain and the chest Continue supportive care I plan to see him in 2 weeks at the end of his radiation therapy to plan on systemic chemotherapy.

## 2018-08-06 NOTE — Assessment & Plan Note (Signed)
He had recent nausea I recommend antiemetics before radiation treatment

## 2018-08-06 NOTE — Assessment & Plan Note (Signed)
He had recent seizure, falls and profound weakness We discussed the importance of physical therapy and rehab.

## 2018-08-06 NOTE — Telephone Encounter (Signed)
Gave pt avs and calendar  °

## 2018-08-06 NOTE — Assessment & Plan Note (Signed)
He is still smoking, down to less than 3 cigarettes/day He appears motivated to quit I recommend nicotine gum

## 2018-08-06 NOTE — Addendum Note (Signed)
Encounter addended by: Erline Levine, MD on: 08/06/2018 11:51 AM  Actions taken: Sign clinical note

## 2018-08-07 ENCOUNTER — Ambulatory Visit
Admission: RE | Admit: 2018-08-07 | Discharge: 2018-08-07 | Disposition: A | Discharge status: Home / Self Care | Payer: Medicare Other | Source: Ambulatory Visit | Attending: Radiation Oncology | Admitting: Radiation Oncology

## 2018-08-07 DIAGNOSIS — C3411 Malignant neoplasm of upper lobe, right bronchus or lung: Secondary | ICD-10-CM | POA: Diagnosis not present

## 2018-08-07 DIAGNOSIS — C7931 Secondary malignant neoplasm of brain: Secondary | ICD-10-CM | POA: Diagnosis not present

## 2018-08-07 DIAGNOSIS — Z510 Encounter for antineoplastic radiation therapy: Secondary | ICD-10-CM | POA: Diagnosis not present

## 2018-08-08 ENCOUNTER — Ambulatory Visit
Admission: RE | Admit: 2018-08-08 | Discharge: 2018-08-08 | Disposition: A | Discharge status: Home / Self Care | Payer: Medicare Other | Source: Ambulatory Visit | Attending: Radiation Oncology | Admitting: Radiation Oncology

## 2018-08-08 DIAGNOSIS — E440 Moderate protein-calorie malnutrition: Secondary | ICD-10-CM | POA: Diagnosis not present

## 2018-08-08 DIAGNOSIS — Z510 Encounter for antineoplastic radiation therapy: Secondary | ICD-10-CM | POA: Diagnosis not present

## 2018-08-08 DIAGNOSIS — R569 Unspecified convulsions: Secondary | ICD-10-CM | POA: Diagnosis not present

## 2018-08-08 DIAGNOSIS — D630 Anemia in neoplastic disease: Secondary | ICD-10-CM | POA: Diagnosis not present

## 2018-08-08 DIAGNOSIS — C3411 Malignant neoplasm of upper lobe, right bronchus or lung: Secondary | ICD-10-CM | POA: Diagnosis not present

## 2018-08-08 DIAGNOSIS — J449 Chronic obstructive pulmonary disease, unspecified: Secondary | ICD-10-CM | POA: Diagnosis not present

## 2018-08-08 DIAGNOSIS — R918 Other nonspecific abnormal finding of lung field: Secondary | ICD-10-CM | POA: Diagnosis not present

## 2018-08-08 DIAGNOSIS — C7931 Secondary malignant neoplasm of brain: Secondary | ICD-10-CM | POA: Diagnosis not present

## 2018-08-09 ENCOUNTER — Ambulatory Visit
Admission: RE | Admit: 2018-08-09 | Discharge: 2018-08-09 | Disposition: A | Discharge status: Home / Self Care | Payer: Medicare Other | Source: Ambulatory Visit | Attending: Radiation Oncology | Admitting: Radiation Oncology

## 2018-08-09 DIAGNOSIS — Z510 Encounter for antineoplastic radiation therapy: Secondary | ICD-10-CM | POA: Diagnosis not present

## 2018-08-09 DIAGNOSIS — C3411 Malignant neoplasm of upper lobe, right bronchus or lung: Secondary | ICD-10-CM | POA: Diagnosis not present

## 2018-08-09 DIAGNOSIS — C7931 Secondary malignant neoplasm of brain: Secondary | ICD-10-CM | POA: Diagnosis not present

## 2018-08-12 ENCOUNTER — Ambulatory Visit
Admission: RE | Admit: 2018-08-12 | Discharge: 2018-08-12 | Disposition: A | Discharge status: Home / Self Care | Payer: Medicare Other | Source: Ambulatory Visit | Attending: Radiation Oncology | Admitting: Radiation Oncology

## 2018-08-12 ENCOUNTER — Telehealth: Payer: Self-pay

## 2018-08-12 DIAGNOSIS — D630 Anemia in neoplastic disease: Secondary | ICD-10-CM | POA: Diagnosis not present

## 2018-08-12 DIAGNOSIS — R569 Unspecified convulsions: Secondary | ICD-10-CM | POA: Diagnosis not present

## 2018-08-12 DIAGNOSIS — J449 Chronic obstructive pulmonary disease, unspecified: Secondary | ICD-10-CM | POA: Diagnosis not present

## 2018-08-12 DIAGNOSIS — R918 Other nonspecific abnormal finding of lung field: Secondary | ICD-10-CM | POA: Diagnosis not present

## 2018-08-12 DIAGNOSIS — E440 Moderate protein-calorie malnutrition: Secondary | ICD-10-CM | POA: Diagnosis not present

## 2018-08-12 DIAGNOSIS — C7931 Secondary malignant neoplasm of brain: Secondary | ICD-10-CM | POA: Diagnosis not present

## 2018-08-12 DIAGNOSIS — C3411 Malignant neoplasm of upper lobe, right bronchus or lung: Secondary | ICD-10-CM | POA: Diagnosis not present

## 2018-08-12 DIAGNOSIS — Z510 Encounter for antineoplastic radiation therapy: Secondary | ICD-10-CM | POA: Diagnosis not present

## 2018-08-12 NOTE — Telephone Encounter (Signed)
Likely due to his poor mobility and steroids treatment Nothing to be done I have referred him to PT

## 2018-08-12 NOTE — Telephone Encounter (Signed)
[facility] nurse called to report new finding of bilat lower extremety +1 pitting edema.   Skin warm, dry, no pain no, SHOB.

## 2018-08-13 ENCOUNTER — Ambulatory Visit
Admission: RE | Admit: 2018-08-13 | Discharge: 2018-08-13 | Disposition: A | Discharge status: Home / Self Care | Payer: Medicare Other | Source: Ambulatory Visit | Attending: Radiation Oncology | Admitting: Radiation Oncology

## 2018-08-13 DIAGNOSIS — C3411 Malignant neoplasm of upper lobe, right bronchus or lung: Secondary | ICD-10-CM | POA: Insufficient documentation

## 2018-08-13 DIAGNOSIS — Z510 Encounter for antineoplastic radiation therapy: Secondary | ICD-10-CM | POA: Insufficient documentation

## 2018-08-13 DIAGNOSIS — D61818 Other pancytopenia: Secondary | ICD-10-CM | POA: Diagnosis not present

## 2018-08-13 DIAGNOSIS — C7931 Secondary malignant neoplasm of brain: Secondary | ICD-10-CM | POA: Diagnosis not present

## 2018-08-13 DIAGNOSIS — C7801 Secondary malignant neoplasm of right lung: Secondary | ICD-10-CM | POA: Diagnosis not present

## 2018-08-13 DIAGNOSIS — C104 Malignant neoplasm of branchial cleft: Secondary | ICD-10-CM | POA: Diagnosis not present

## 2018-08-13 DIAGNOSIS — R5381 Other malaise: Secondary | ICD-10-CM | POA: Insufficient documentation

## 2018-08-13 NOTE — Telephone Encounter (Signed)
Called wife and given below message. She verbalized understanding.

## 2018-08-14 ENCOUNTER — Ambulatory Visit
Admission: RE | Admit: 2018-08-14 | Discharge: 2018-08-14 | Disposition: A | Discharge status: Home / Self Care | Payer: Medicare Other | Source: Ambulatory Visit | Attending: Radiation Oncology | Admitting: Radiation Oncology

## 2018-08-14 DIAGNOSIS — Z510 Encounter for antineoplastic radiation therapy: Secondary | ICD-10-CM | POA: Diagnosis not present

## 2018-08-14 DIAGNOSIS — C104 Malignant neoplasm of branchial cleft: Secondary | ICD-10-CM | POA: Diagnosis not present

## 2018-08-14 DIAGNOSIS — C7801 Secondary malignant neoplasm of right lung: Secondary | ICD-10-CM | POA: Diagnosis not present

## 2018-08-14 DIAGNOSIS — D61818 Other pancytopenia: Secondary | ICD-10-CM | POA: Diagnosis not present

## 2018-08-14 DIAGNOSIS — C7931 Secondary malignant neoplasm of brain: Secondary | ICD-10-CM | POA: Diagnosis not present

## 2018-08-14 DIAGNOSIS — C3411 Malignant neoplasm of upper lobe, right bronchus or lung: Secondary | ICD-10-CM | POA: Diagnosis not present

## 2018-08-15 ENCOUNTER — Ambulatory Visit
Admission: RE | Admit: 2018-08-15 | Discharge: 2018-08-15 | Disposition: A | Discharge status: Home / Self Care | Payer: Medicare Other | Source: Ambulatory Visit | Attending: Radiation Oncology | Admitting: Radiation Oncology

## 2018-08-15 ENCOUNTER — Telehealth: Payer: Self-pay

## 2018-08-15 DIAGNOSIS — Z510 Encounter for antineoplastic radiation therapy: Secondary | ICD-10-CM | POA: Diagnosis not present

## 2018-08-15 DIAGNOSIS — R569 Unspecified convulsions: Secondary | ICD-10-CM | POA: Diagnosis not present

## 2018-08-15 DIAGNOSIS — D61818 Other pancytopenia: Secondary | ICD-10-CM | POA: Diagnosis not present

## 2018-08-15 DIAGNOSIS — E440 Moderate protein-calorie malnutrition: Secondary | ICD-10-CM | POA: Diagnosis not present

## 2018-08-15 DIAGNOSIS — R918 Other nonspecific abnormal finding of lung field: Secondary | ICD-10-CM | POA: Diagnosis not present

## 2018-08-15 DIAGNOSIS — C7931 Secondary malignant neoplasm of brain: Secondary | ICD-10-CM | POA: Diagnosis not present

## 2018-08-15 DIAGNOSIS — C7801 Secondary malignant neoplasm of right lung: Secondary | ICD-10-CM | POA: Diagnosis not present

## 2018-08-15 DIAGNOSIS — C3411 Malignant neoplasm of upper lobe, right bronchus or lung: Secondary | ICD-10-CM | POA: Diagnosis not present

## 2018-08-15 DIAGNOSIS — J449 Chronic obstructive pulmonary disease, unspecified: Secondary | ICD-10-CM | POA: Diagnosis not present

## 2018-08-15 DIAGNOSIS — C104 Malignant neoplasm of branchial cleft: Secondary | ICD-10-CM | POA: Diagnosis not present

## 2018-08-15 DIAGNOSIS — D630 Anemia in neoplastic disease: Secondary | ICD-10-CM | POA: Diagnosis not present

## 2018-08-15 NOTE — Telephone Encounter (Signed)
Clair Gulling, PT with [facility] called and left a message. They are discharging him from PT. Mr. Seto has reached his max potential.

## 2018-08-16 ENCOUNTER — Ambulatory Visit
Admission: RE | Admit: 2018-08-16 | Discharge: 2018-08-16 | Disposition: A | Discharge status: Home / Self Care | Payer: Medicare Other | Source: Ambulatory Visit | Attending: Radiation Oncology | Admitting: Radiation Oncology

## 2018-08-16 ENCOUNTER — Ambulatory Visit: Payer: Medicare Other | Admitting: Neurology

## 2018-08-16 ENCOUNTER — Encounter

## 2018-08-16 DIAGNOSIS — D61818 Other pancytopenia: Secondary | ICD-10-CM | POA: Diagnosis not present

## 2018-08-16 DIAGNOSIS — C7931 Secondary malignant neoplasm of brain: Secondary | ICD-10-CM | POA: Diagnosis not present

## 2018-08-16 DIAGNOSIS — Z510 Encounter for antineoplastic radiation therapy: Secondary | ICD-10-CM | POA: Diagnosis not present

## 2018-08-16 DIAGNOSIS — C7801 Secondary malignant neoplasm of right lung: Secondary | ICD-10-CM | POA: Diagnosis not present

## 2018-08-16 DIAGNOSIS — C104 Malignant neoplasm of branchial cleft: Secondary | ICD-10-CM | POA: Diagnosis not present

## 2018-08-16 DIAGNOSIS — C3411 Malignant neoplasm of upper lobe, right bronchus or lung: Secondary | ICD-10-CM | POA: Diagnosis not present

## 2018-08-19 ENCOUNTER — Ambulatory Visit
Admission: RE | Admit: 2018-08-19 | Discharge: 2018-08-19 | Disposition: A | Discharge status: Home / Self Care | Payer: Medicare Other | Source: Ambulatory Visit | Attending: Radiation Oncology | Admitting: Radiation Oncology

## 2018-08-19 DIAGNOSIS — Z510 Encounter for antineoplastic radiation therapy: Secondary | ICD-10-CM | POA: Diagnosis not present

## 2018-08-19 DIAGNOSIS — D61818 Other pancytopenia: Secondary | ICD-10-CM | POA: Diagnosis not present

## 2018-08-19 DIAGNOSIS — C7931 Secondary malignant neoplasm of brain: Secondary | ICD-10-CM | POA: Diagnosis not present

## 2018-08-19 DIAGNOSIS — C7801 Secondary malignant neoplasm of right lung: Secondary | ICD-10-CM | POA: Diagnosis not present

## 2018-08-19 DIAGNOSIS — C3411 Malignant neoplasm of upper lobe, right bronchus or lung: Secondary | ICD-10-CM | POA: Diagnosis not present

## 2018-08-19 DIAGNOSIS — C104 Malignant neoplasm of branchial cleft: Secondary | ICD-10-CM | POA: Diagnosis not present

## 2018-08-20 ENCOUNTER — Inpatient Hospital Stay: Payer: Medicare Other | Attending: Hematology and Oncology

## 2018-08-20 ENCOUNTER — Ambulatory Visit: Payer: Medicare Other

## 2018-08-20 ENCOUNTER — Other Ambulatory Visit: Payer: Self-pay | Admitting: Hematology and Oncology

## 2018-08-20 ENCOUNTER — Inpatient Hospital Stay (HOSPITAL_BASED_OUTPATIENT_CLINIC_OR_DEPARTMENT_OTHER): Payer: Medicare Other | Admitting: Hematology and Oncology

## 2018-08-20 ENCOUNTER — Encounter: Payer: Self-pay | Admitting: Radiation Oncology

## 2018-08-20 ENCOUNTER — Telehealth: Payer: Self-pay | Admitting: Hematology and Oncology

## 2018-08-20 ENCOUNTER — Encounter: Payer: Self-pay | Admitting: Hematology and Oncology

## 2018-08-20 ENCOUNTER — Ambulatory Visit
Admission: RE | Admit: 2018-08-20 | Discharge: 2018-08-20 | Disposition: A | Discharge status: Home / Self Care | Payer: Medicare Other | Source: Ambulatory Visit | Attending: Radiation Oncology | Admitting: Radiation Oncology

## 2018-08-20 VITALS — BP 151/61 | HR 69 | Temp 97.8°F | Resp 17 | Ht 68.0 in | Wt 153.2 lb

## 2018-08-20 DIAGNOSIS — Z8042 Family history of malignant neoplasm of prostate: Secondary | ICD-10-CM

## 2018-08-20 DIAGNOSIS — R5383 Other fatigue: Secondary | ICD-10-CM | POA: Diagnosis not present

## 2018-08-20 DIAGNOSIS — R5381 Other malaise: Secondary | ICD-10-CM | POA: Diagnosis not present

## 2018-08-20 DIAGNOSIS — I10 Essential (primary) hypertension: Secondary | ICD-10-CM

## 2018-08-20 DIAGNOSIS — R600 Localized edema: Secondary | ICD-10-CM | POA: Diagnosis not present

## 2018-08-20 DIAGNOSIS — M25552 Pain in left hip: Secondary | ICD-10-CM

## 2018-08-20 DIAGNOSIS — Z9221 Personal history of antineoplastic chemotherapy: Secondary | ICD-10-CM | POA: Insufficient documentation

## 2018-08-20 DIAGNOSIS — C3411 Malignant neoplasm of upper lobe, right bronchus or lung: Secondary | ICD-10-CM | POA: Diagnosis not present

## 2018-08-20 DIAGNOSIS — G4733 Obstructive sleep apnea (adult) (pediatric): Secondary | ICD-10-CM

## 2018-08-20 DIAGNOSIS — D61818 Other pancytopenia: Secondary | ICD-10-CM | POA: Diagnosis not present

## 2018-08-20 DIAGNOSIS — Z9989 Dependence on other enabling machines and devices: Secondary | ICD-10-CM

## 2018-08-20 DIAGNOSIS — I2510 Atherosclerotic heart disease of native coronary artery without angina pectoris: Secondary | ICD-10-CM

## 2018-08-20 DIAGNOSIS — J449 Chronic obstructive pulmonary disease, unspecified: Secondary | ICD-10-CM | POA: Diagnosis not present

## 2018-08-20 DIAGNOSIS — C3491 Malignant neoplasm of unspecified part of right bronchus or lung: Secondary | ICD-10-CM

## 2018-08-20 DIAGNOSIS — Z923 Personal history of irradiation: Secondary | ICD-10-CM | POA: Insufficient documentation

## 2018-08-20 DIAGNOSIS — Z510 Encounter for antineoplastic radiation therapy: Secondary | ICD-10-CM | POA: Diagnosis not present

## 2018-08-20 DIAGNOSIS — D751 Secondary polycythemia: Secondary | ICD-10-CM | POA: Diagnosis not present

## 2018-08-20 DIAGNOSIS — Z79899 Other long term (current) drug therapy: Secondary | ICD-10-CM

## 2018-08-20 DIAGNOSIS — F17210 Nicotine dependence, cigarettes, uncomplicated: Secondary | ICD-10-CM | POA: Diagnosis not present

## 2018-08-20 DIAGNOSIS — E871 Hypo-osmolality and hyponatremia: Secondary | ICD-10-CM

## 2018-08-20 DIAGNOSIS — C342 Malignant neoplasm of middle lobe, bronchus or lung: Secondary | ICD-10-CM | POA: Diagnosis not present

## 2018-08-20 DIAGNOSIS — C7931 Secondary malignant neoplasm of brain: Secondary | ICD-10-CM | POA: Diagnosis not present

## 2018-08-20 DIAGNOSIS — C104 Malignant neoplasm of branchial cleft: Secondary | ICD-10-CM | POA: Diagnosis not present

## 2018-08-20 DIAGNOSIS — R002 Palpitations: Secondary | ICD-10-CM

## 2018-08-20 DIAGNOSIS — C7801 Secondary malignant neoplasm of right lung: Secondary | ICD-10-CM | POA: Diagnosis not present

## 2018-08-20 DIAGNOSIS — R42 Dizziness and giddiness: Secondary | ICD-10-CM

## 2018-08-20 DIAGNOSIS — K219 Gastro-esophageal reflux disease without esophagitis: Secondary | ICD-10-CM

## 2018-08-20 DIAGNOSIS — C3490 Malignant neoplasm of unspecified part of unspecified bronchus or lung: Secondary | ICD-10-CM

## 2018-08-20 DIAGNOSIS — I7300 Raynaud's syndrome without gangrene: Secondary | ICD-10-CM

## 2018-08-20 DIAGNOSIS — Z9181 History of falling: Secondary | ICD-10-CM

## 2018-08-20 LAB — CBC WITH DIFFERENTIAL/PLATELET
ABS IMMATURE GRANULOCYTES: 0.07 10*3/uL (ref 0.00–0.07)
Basophils Absolute: 0 10*3/uL (ref 0.0–0.1)
Basophils Relative: 0 %
EOS PCT: 0 %
Eosinophils Absolute: 0 10*3/uL (ref 0.0–0.5)
HCT: 35.6 % — ABNORMAL LOW (ref 39.0–52.0)
HEMOGLOBIN: 12 g/dL — AB (ref 13.0–17.0)
Immature Granulocytes: 2 %
LYMPHS ABS: 0.4 10*3/uL — AB (ref 0.7–4.0)
LYMPHS PCT: 10 %
MCH: 32.4 pg (ref 26.0–34.0)
MCHC: 33.7 g/dL (ref 30.0–36.0)
MCV: 96.2 fL (ref 80.0–100.0)
MONO ABS: 0.4 10*3/uL (ref 0.1–1.0)
Monocytes Relative: 9 %
NEUTROS ABS: 3.3 10*3/uL (ref 1.7–7.7)
Neutrophils Relative %: 79 %
PLATELETS: 92 10*3/uL — AB (ref 150–400)
RBC: 3.7 MIL/uL — ABNORMAL LOW (ref 4.22–5.81)
RDW: 18.2 % — AB (ref 11.5–15.5)
WBC: 4.1 10*3/uL (ref 4.0–10.5)
nRBC: 0 % (ref 0.0–0.2)

## 2018-08-20 LAB — COMPREHENSIVE METABOLIC PANEL
ALBUMIN: 2.7 g/dL — AB (ref 3.5–5.0)
ALT: 19 U/L (ref 0–44)
AST: 12 U/L — AB (ref 15–41)
Alkaline Phosphatase: 152 U/L — ABNORMAL HIGH (ref 38–126)
Anion gap: 8 (ref 5–15)
BUN: 40 mg/dL — AB (ref 8–23)
CHLORIDE: 104 mmol/L (ref 98–111)
CO2: 26 mmol/L (ref 22–32)
CREATININE: 1.37 mg/dL — AB (ref 0.61–1.24)
Calcium: 8.6 mg/dL — ABNORMAL LOW (ref 8.9–10.3)
GFR calc Af Amer: 54 mL/min — ABNORMAL LOW (ref >60)
GFR, EST NON AFRICAN AMERICAN: 46 mL/min — AB (ref >60)
Glucose, Bld: 95 mg/dL (ref 70–99)
Potassium: 4.4 mmol/L (ref 3.5–5.1)
Sodium: 138 mmol/L (ref 135–145)
Total Bilirubin: 0.6 mg/dL (ref 0.3–1.2)
Total Protein: 5.6 g/dL — ABNORMAL LOW (ref 6.5–8.1)

## 2018-08-20 MED ORDER — LEVETIRACETAM 750 MG PO TABS: 750.0000 mg | ORAL_TABLET | Freq: Two times a day (BID) | ORAL | 9 refills | Status: DC

## 2018-08-20 NOTE — Patient Instructions (Signed)
Carboplatin injection What is this medicine? CARBOPLATIN (KAR boe pla tin) is a chemotherapy drug. It targets fast dividing cells, like cancer cells, and causes these cells to die. This medicine is used to treat ovarian cancer and many other cancers. This medicine may be used for other purposes; ask your health care provider or pharmacist if you have questions. COMMON BRAND NAME(S): Paraplatin What should I tell my health care provider before I take this medicine? They need to know if you have any of these conditions: -blood disorders -hearing problems -kidney disease -recent or ongoing radiation therapy -an unusual or allergic reaction to carboplatin, cisplatin, other chemotherapy, other medicines, foods, dyes, or preservatives -pregnant or trying to get pregnant -breast-feeding How should I use this medicine? This drug is usually given as an infusion into a vein. It is administered in a hospital or clinic by a specially trained health care professional. Talk to your pediatrician regarding the use of this medicine in children. Special care may be needed. Overdosage: If you think you have taken too much of this medicine contact a poison control center or emergency room at once. NOTE: This medicine is only for you. Do not share this medicine with others. What if I miss a dose? It is important not to miss a dose. Call your doctor or health care professional if you are unable to keep an appointment. What may interact with this medicine? -medicines for seizures -medicines to increase blood counts like filgrastim, pegfilgrastim, sargramostim -some antibiotics like amikacin, gentamicin, neomycin, streptomycin, tobramycin -vaccines Talk to your doctor or health care professional before taking any of these medicines: -acetaminophen -aspirin -ibuprofen -ketoprofen -naproxen This list may not describe all possible interactions. Give your health care provider a list of all the medicines, herbs,  non-prescription drugs, or dietary supplements you use. Also tell them if you smoke, drink alcohol, or use illegal drugs. Some items may interact with your medicine. What should I watch for while using this medicine? Your condition will be monitored carefully while you are receiving this medicine. You will need important blood work done while you are taking this medicine. This drug may make you feel generally unwell. This is not uncommon, as chemotherapy can affect healthy cells as well as cancer cells. Report any side effects. Continue your course of treatment even though you feel ill unless your doctor tells you to stop. In some cases, you may be given additional medicines to help with side effects. Follow all directions for their use. Call your doctor or health care professional for advice if you get a fever, chills or sore throat, or other symptoms of a cold or flu. Do not treat yourself. This drug decreases your body's ability to fight infections. Try to avoid being around people who are sick. This medicine may increase your risk to bruise or bleed. Call your doctor or health care professional if you notice any unusual bleeding. Be careful brushing and flossing your teeth or using a toothpick because you may get an infection or bleed more easily. If you have any dental work done, tell your dentist you are receiving this medicine. Avoid taking products that contain aspirin, acetaminophen, ibuprofen, naproxen, or ketoprofen unless instructed by your doctor. These medicines may hide a fever. Do not become pregnant while taking this medicine. Women should inform their doctor if they wish to become pregnant or think they might be pregnant. There is a potential for serious side effects to an unborn child. Talk to your health care professional or  pharmacist for more information. Do not breast-feed an infant while taking this medicine. What side effects may I notice from receiving this medicine? Side effects  that you should report to your doctor or health care professional as soon as possible: -allergic reactions like skin rash, itching or hives, swelling of the face, lips, or tongue -signs of infection - fever or chills, cough, sore throat, pain or difficulty passing urine -signs of decreased platelets or bleeding - bruising, pinpoint red spots on the skin, black, tarry stools, nosebleeds -signs of decreased red blood cells - unusually weak or tired, fainting spells, lightheadedness -breathing problems -changes in hearing -changes in vision -chest pain -high blood pressure -low blood counts - This drug may decrease the number of white blood cells, red blood cells and platelets. You may be at increased risk for infections and bleeding. -nausea and vomiting -pain, swelling, redness or irritation at the injection site -pain, tingling, numbness in the hands or feet -problems with balance, talking, walking -trouble passing urine or change in the amount of urine Side effects that usually do not require medical attention (report to your doctor or health care professional if they continue or are bothersome): -hair loss -loss of appetite -metallic taste in the mouth or changes in taste This list may not describe all possible side effects. Call your doctor for medical advice about side effects. You may report side effects to FDA at 1-800-FDA-1088. Where should I keep my medicine? This drug is given in a hospital or clinic and will not be stored at home. NOTE: This sheet is a summary. It may not cover all possible information. If you have questions about this medicine, talk to your doctor, pharmacist, or health care provider.  2018 Elsevier/Gold Standard (2008-02-04 14:38:05) Gemcitabine injection What is this medicine? GEMCITABINE (jem SIT a been) is a chemotherapy drug. This medicine is used to treat many types of cancer like breast cancer, lung cancer, pancreatic cancer, and ovarian cancer. This  medicine may be used for other purposes; ask your health care provider or pharmacist if you have questions. COMMON BRAND NAME(S): Gemzar What should I tell my health care provider before I take this medicine? They need to know if you have any of these conditions: -blood disorders -infection -kidney disease -liver disease -recent or ongoing radiation therapy -an unusual or allergic reaction to gemcitabine, other chemotherapy, other medicines, foods, dyes, or preservatives -pregnant or trying to get pregnant -breast-feeding How should I use this medicine? This drug is given as an infusion into a vein. It is administered in a hospital or clinic by a specially trained health care professional. Talk to your pediatrician regarding the use of this medicine in children. Special care may be needed. Overdosage: If you think you have taken too much of this medicine contact a poison control center or emergency room at once. NOTE: This medicine is only for you. Do not share this medicine with others. What if I miss a dose? It is important not to miss your dose. Call your doctor or health care professional if you are unable to keep an appointment. What may interact with this medicine? -medicines to increase blood counts like filgrastim, pegfilgrastim, sargramostim -some other chemotherapy drugs like cisplatin -vaccines Talk to your doctor or health care professional before taking any of these medicines: -acetaminophen -aspirin -ibuprofen -ketoprofen -naproxen This list may not describe all possible interactions. Give your health care provider a list of all the medicines, herbs, non-prescription drugs, or dietary supplements you use. Also  tell them if you smoke, drink alcohol, or use illegal drugs. Some items may interact with your medicine. What should I watch for while using this medicine? Visit your doctor for checks on your progress. This drug may make you feel generally unwell. This is not  uncommon, as chemotherapy can affect healthy cells as well as cancer cells. Report any side effects. Continue your course of treatment even though you feel ill unless your doctor tells you to stop. In some cases, you may be given additional medicines to help with side effects. Follow all directions for their use. Call your doctor or health care professional for advice if you get a fever, chills or sore throat, or other symptoms of a cold or flu. Do not treat yourself. This drug decreases your body's ability to fight infections. Try to avoid being around people who are sick. This medicine may increase your risk to bruise or bleed. Call your doctor or health care professional if you notice any unusual bleeding. Be careful brushing and flossing your teeth or using a toothpick because you may get an infection or bleed more easily. If you have any dental work done, tell your dentist you are receiving this medicine. Avoid taking products that contain aspirin, acetaminophen, ibuprofen, naproxen, or ketoprofen unless instructed by your doctor. These medicines may hide a fever. Women should inform their doctor if they wish to become pregnant or think they might be pregnant. There is a potential for serious side effects to an unborn child. Talk to your health care professional or pharmacist for more information. Do not breast-feed an infant while taking this medicine. What side effects may I notice from receiving this medicine? Side effects that you should report to your doctor or health care professional as soon as possible: -allergic reactions like skin rash, itching or hives, swelling of the face, lips, or tongue -low blood counts - this medicine may decrease the number of white blood cells, red blood cells and platelets. You may be at increased risk for infections and bleeding. -signs of infection - fever or chills, cough, sore throat, pain or difficulty passing urine -signs of decreased platelets or bleeding  - bruising, pinpoint red spots on the skin, black, tarry stools, blood in the urine -signs of decreased red blood cells - unusually weak or tired, fainting spells, lightheadedness -breathing problems -chest pain -mouth sores -nausea and vomiting -pain, swelling, redness at site where injected -pain, tingling, numbness in the hands or feet -stomach pain -swelling of ankles, feet, hands -unusual bleeding Side effects that usually do not require medical attention (report to your doctor or health care professional if they continue or are bothersome): -constipation -diarrhea -hair loss -loss of appetite -stomach upset This list may not describe all possible side effects. Call your doctor for medical advice about side effects. You may report side effects to FDA at 1-800-FDA-1088. Where should I keep my medicine? This drug is given in a hospital or clinic and will not be stored at home. NOTE: This sheet is a summary. It may not cover all possible information. If you have questions about this medicine, talk to your doctor, pharmacist, or health care provider.  2018 Elsevier/Gold Standard (2008-03-10 18:45:54)

## 2018-08-20 NOTE — Assessment & Plan Note (Signed)
This is due to recent treatment with radiation therapy Observe only for now

## 2018-08-20 NOTE — Telephone Encounter (Signed)
Gave patient avs and calendar.  Patient aware that [facility] will call with date/time of appt for [facility] Insertion and PET Scan.

## 2018-08-20 NOTE — Assessment & Plan Note (Signed)
The patient is quite debilitated He denies recent falls I recommend outpatient physical therapy and rehab and he agreed

## 2018-08-20 NOTE — Assessment & Plan Note (Signed)
He has significant bilateral leg edema This is likely due to reduced mobility, fluid retention from steroids and low protein status He is currently on a taper course of dexamethasone, will plan to stop on September 10, 2018 I encourage mobility as tolerated and hopefully, his leg swelling will improve by next month

## 2018-08-20 NOTE — Assessment & Plan Note (Signed)
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, only 1% I do not feel strongly he will benefit from pembrolizumab Due to recent radiation therapy exposure, background history of COPD, etc., his risk of pneumonitis with pembrolizumab is extremely high with unlikely beneficial effects on his treatment For the same reason, I do not feel strongly he would be a good candidate for clinical trial given his significant debility I plan to give him another few more weeks to recover from side effects of treatment I plan to order a PET CT scan early next month for staging before we proceed with chemotherapy I printed a copy of the current guidelines and shared with the patient and family The patient does not want treatment that could cause hair loss I think combination treatment with carboplatin and gemcitabine would be useful in this situation I will schedule port placement, chemo education class and I will see him before the first dose of treatment to review all test results and plan of care

## 2018-08-20 NOTE — Progress Notes (Signed)
[place] OFFICE PROGRESS NOTE  Patient Care Team: Wenda Low, MD as PCP - General (Internal Medicine)  ASSESSMENT & PLAN:  Bronchogenic cancer of right lung [facility]) 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, only 1% I do not feel strongly he will benefit from pembrolizumab Due to recent radiation therapy exposure, background history of COPD, etc., his risk of pneumonitis with pembrolizumab is extremely high with unlikely beneficial effects on his treatment For the same reason, I do not feel strongly he would be a good candidate for clinical trial given his significant debility I plan to give him another few more weeks to recover from side effects of treatment I plan to order a PET CT scan early next month for staging before we proceed with chemotherapy I printed a copy of the current guidelines and shared with the patient and family The patient does not want treatment that could cause hair loss I think combination treatment with carboplatin and gemcitabine would be useful in this situation I will schedule port placement, chemo education class and I will see him before the first dose of treatment to review all test results and plan of care  Acquired pancytopenia [facility]) This is due to recent treatment with radiation therapy Observe only for now  Brain metastases [facility]) He denies further seizures I refilled his prescription of antiseizure medications I would defer to radiation oncologist to schedule follow-up brain imaging study  Physical debility The patient is quite debilitated He denies recent falls I recommend outpatient physical therapy and rehab and he agreed  Bilateral leg edema He has significant bilateral leg edema This is likely due to reduced mobility, fluid retention from steroids and low protein status He is currently on a taper  course of dexamethasone, will plan to stop on September 10, 2018 I encourage mobility as tolerated and hopefully, his leg swelling will improve by next month   Orders Placed This Encounter  Procedures  . NM PET Image Restag (PS) Skull Base To Thigh    Standing Status:   Future    Standing Expiration Date:   08/21/2019    Order Specific Question:   If indicated for the ordered procedure, I authorize the administration of a radiopharmaceutical per Radiology protocol    Answer:   Yes    Order Specific Question:   Preferred imaging location?    Answer:   [facility]    Order Specific Question:   Radiology Contrast Protocol - do NOT remove file path    Answer:   [email or web address]  . IR IMAGING GUIDED PORT INSERTION    Standing Status:   Future    Standing Expiration Date:   10/21/2019    Order Specific Question:   Reason for Exam (SYMPTOM  OR DIAGNOSIS REQUIRED)    Answer:   need port for chemo to start 11/5    Order Specific Question:   Preferred Imaging Location?    Answer:   [facility]  . CBC with Differential ([place] Only)    Standing Status:   Standing    Number of Occurrences:   20    Standing Expiration Date:   08/21/2019  . CMP ([place] only)    Standing Status:   Standing    Number of Occurrences:   20    Standing Expiration Date:   08/21/2019  . Ambulatory referral to Physical  Therapy    Referral Priority:   Routine    Referral Type:   Physical Medicine    Referral Reason:   Specialty Services Required    Requested Specialty:   Physical Therapy    Number of Visits Requested:   1    INTERVAL HISTORY: Please see below for problem oriented charting. He returns for further follow-up with his wife He just completed radiation therapy He has some mild weight gain and bilateral leg edema due to fluid retention and reduced mobility No recent seizures He has reduced right hand function due to weakness No recent infection,  fever or chills.  SUMMARY OF ONCOLOGIC HISTORY: Oncology History   Squamous cell PD-L1 1%     Brain metastases ([place])   07/13/2018 Initial Diagnosis    Brain metastases ([place])    08/20/2018 -  Chemotherapy    The patient had palonosetron (ALOXI) injection 0.25 mg, 0.25 mg, Intravenous,  Once, 0 of 4 cycles CARBOplatin (PARAPLATIN) 330 mg in sodium chloride 0.9 % 100 mL chemo infusion, 330 mg (100 % of original dose 329.5 mg), Intravenous,  Once, 0 of 4 cycles Dose modification:   (original dose 329.5 mg, Cycle 1) gemcitabine (GEMZAR) 1,482 mg in sodium chloride 0.9 % 100 mL chemo infusion, 800 mg/m2 = 1,482 mg (80 % of original dose 1,000 mg/m2), Intravenous,  Once, 0 of 4 cycles Dose modification: 800 mg/m2 (80 % of original dose 1,000 mg/m2, Cycle 1, Reason: Patient Age)  for chemotherapy treatment.      Bronchogenic cancer of right lung ([place])   06/21/2018 Imaging    Large RIGHT upper lobe mass 9.6 x 5.5 x 8.4 cm in size consistent with a primary pulmonary neoplasm.  Mass abuts the anterior chest wall, mediastinum and superior RIGHT hilum and demonstrates central necrosis.  Postobstructive pneumonitis versus lymphangitic tumor spread in RIGHT upper lobe.  Underlying pan lobular emphysema.  Tiny scattered nonspecific ground-glass opacities in the lower lobes.  Mediastinal adenopathy as above.  Aortic Atherosclerosis (ICD10-I70.0) and Emphysema (ICD10-J43.9)    07/02/2018 Pathology Results    Endobronchial biopsy, RUL - COMPLETELY NECROTIC TISSUE, SUGGESTIVE OF A NECROTIC TUMOR.    07/02/2018 Procedure    Bronchoscopy Procedure Note  Date of Operation: 07/02/2018   Pre-op Diagnosis: RUL mass  Post-op Diagnosis: same  Surgeon: Christinia Gully  Anesthesia: Monitored Local Anesthesia with Sedation Time Started:  0814 versed 2.5 mg IV/ demerol 25 mg IV  Time Stopped:  0825  Operation: Video Flexible fiberoptic bronchoscopy, diagnostic   Findings: mass prolapsing  out of RUL apical segment completely obst it   Specimen:  BAL rul/ endobronchial bx x 2 plus portion of mass suctioned off   Estimated Blood Loss: min  Complications: none  Indications and History: See updated H and P same date. The risks, benefits, complications, treatment options and expected outcomes were discussed with the patient.  The possibilities of reaction to medication, pulmonary aspiration, perforation of a viscus, bleeding, failure to diagnose a condition and creating a complication requiring transfusion or operation were discussed with the patient who freely signed the consent.    Description of Procedure: The patient was re-examined in the bronchoscopy suite and the site of surgery properly noted/marked.  The patient was identified  and the procedure verified as Flexible Fiberoptic Bronchoscopy.  A Time Out was held and the above information confirmed.   After the induction of topical nasopharyngeal anesthesia, the patient was positioned  and the bronchoscope was passed through the R naris.  The vocal cords were visualized and  1% buffered lidocaine 5 ml was topically placed onto the cords. The cords were nl. The scope was then passed into the trachea.  1% buffered lidocaine given topically. Airways inspected bilaterally to the subsegmental level with the following findings:  Airways nl x for mass prolapsing out of RUL apical segment completely obst it    Interventions: BAL rul/ endobronchial bx x 2 RUl     07/12/2018 PET scan    1. Hypermetabolic RIGHT upper lobe mass consists primary bronchogenic carcinoma. 2. Ipsilateral hypermetabolic mediastinal nodal metastasis. 3. Metastatic brain lesion in the RIGHT frontal lobe partially imaged. See comparison head CT from 07/13/2018 4. No supraclavicular adenopathy. 5. No evidence of metastatic disease below the diaphragms.    07/13/2018 Imaging    CT head 16 x 18 mm hyperdense mass right basal ganglia with extensive  surrounding edema and 7 mm midline shift. Findings most compatible with hemorrhagic metastatic disease. Patient has a large lung mass which is hypermetabolic on PET scan yesterday compatible with lung carcinoma.  Moderate atrophy and moderate chronic microvascular ischemia     07/13/2018 Imaging    MRI brain Enhancing mass lesion right basal ganglia with extensive surrounding edema. Given history of lung cancer this is consistent with metastatic disease. Second 2 mm enhancing lesion in the right frontal lobe also consistent with metastatic disease. There is mass-effect and 7 mm midline shift to the left due to extensive edema.    07/16/2018 Procedure    Status post CT-guided biopsy of right upper lobe mass. Tissue specimen sent to pathology for complete histopathologic analysis.    07/16/2018 Pathology Results    Lung, biopsy, right upper lobe - NON-SMALL CELL CARCINOMA, SEE COMMENT. Microscopic Comment Immunohistochemistry is focally positive for cytokeratin 5/6 and p63. TTF-1 and Napsin A are negative. The immunoprofile slightly favors a squamous cell carcinoma. There is likely sufficient tissue for additional studies if requested (Block 1A). Dr. Lyndon Code has reviewed the case.    07/19/2018 Cancer Staging    Staging form: Lung, AJCC 8th Edition - Clinical: Stage IV (cT4, cN2, cM1) - Signed by [name], MD on 07/19/2018    08/20/2018 -  Chemotherapy    The patient had palonosetron (ALOXI) injection 0.25 mg, 0.25 mg, Intravenous,  Once, 0 of 4 cycles CARBOplatin (PARAPLATIN) 330 mg in sodium chloride 0.9 % 100 mL chemo infusion, 330 mg (100 % of original dose 329.5 mg), Intravenous,  Once, 0 of 4 cycles Dose modification:   (original dose 329.5 mg, Cycle 1) gemcitabine (GEMZAR) 1,482 mg in sodium chloride 0.9 % 100 mL chemo infusion, 800 mg/m2 = 1,482 mg (80 % of original dose 1,000 mg/m2), Intravenous,  Once, 0 of 4 cycles Dose modification: 800 mg/m2 (80 % of original dose 1,000 mg/m2, Cycle 1,  Reason: Patient Age)  for chemotherapy treatment.      REVIEW OF SYSTEMS:   Constitutional: Denies fevers, chills or abnormal weight loss Eyes: Denies blurriness of vision Ears, nose, mouth, throat, and face: Denies mucositis or sore throat Respiratory: Denies cough, dyspnea or wheezes Cardiovascular: Denies palpitation, chest discomfort  Gastrointestinal:  Denies nausea, heartburn or change in bowel habits Skin: Denies abnormal skin rashes Lymphatics: Denies new lymphadenopathy or easy bruising Behavioral/Psych: Mood is stable, no new changes  All other systems were reviewed with the patient and are negative.  I have reviewed the past medical history, past surgical history, social history and family history with the patient and they are unchanged  from previous note.  ALLERGIES:  is allergic to ace inhibitors and ramipril.  MEDICATIONS:  Current Outpatient Medications  Medication Sig Dispense Refill  . amLODipine (NORVASC) 5 MG tablet Take 5 mg by mouth daily.  5  . benzonatate (TESSALON) 200 MG capsule Take 1 capsule by mouth 3 (three) times daily as needed for cough.   0  . cholecalciferol (VITAMIN D) 1000 UNITS tablet Take 1,000 Units by mouth daily.    . [employer] B-1 100 MG tablet Take 100 mg by mouth daily.  11  . dexamethasone (DECADRON) 4 MG tablet Take 1 tablet (4 mg total) by mouth 4 (four) times daily. Taper per instructions 60 tablet 0  . latanoprost (XALATAN) 0.005 % ophthalmic solution Place 1 drop into both eyes at bedtime.   12  . levETIRAcetam (KEPPRA) 750 MG tablet Take 1 tablet (750 mg total) by mouth 2 (two) times daily. 60 tablet 9  . Magnesium 250 MG TABS Take 250 mg by mouth daily.     . Multiple Vitamins-Minerals (CENTRUM SILVER ADULT 50+) TABS Take 1 tablet by mouth daily.    . nicotine polacrilex (CVS NICOTINE POLACRILEX) 2 MG gum Take 1 each (2 mg total) by mouth as needed for smoking cessation. 100 tablet 0  . ondansetron (ZOFRAN) 8 MG tablet Take 1 tablet (8  mg total) by mouth every 8 (eight) hours as needed for nausea. 30 tablet 3  . prochlorperazine (COMPAZINE) 10 MG tablet Take 1 tablet (10 mg total) by mouth every 6 (six) hours as needed for nausea or vomiting. 30 tablet 3  . vitamin B-12 (CYANOCOBALAMIN) 1000 MCG tablet Take 1,000 mcg by mouth daily.     No current facility-administered medications for this visit.     PHYSICAL EXAMINATION: ECOG PERFORMANCE STATUS: 2 - Symptomatic, <50% confined to bed  Vitals:   08/20/18 1148  BP: (!) 151/61  Pulse: 69  Resp: 17  Temp: 97.8 F (36.6 C)  SpO2: 100%   Filed Weights   08/20/18 1148  Weight: 153 lb 3.2 oz (69.5 kg)    GENERAL:alert, no distress and comfortable.  He appears mildly cushingoid HEART: Significant bilateral lower extremity edema NEURO: alert & oriented x 3 with fluent speech  LABORATORY DATA:  I have reviewed the data as listed    Component Value Date/Time   NA 138 08/20/2018 1028   K 4.4 08/20/2018 1028   CL 104 08/20/2018 1028   CO2 26 08/20/2018 1028   GLUCOSE 95 08/20/2018 1028   BUN 40 (H) 08/20/2018 1028   CREATININE 1.37 (H) 08/20/2018 1028   CALCIUM 8.6 (L) 08/20/2018 1028   PROT 5.6 (L) 08/20/2018 1028   ALBUMIN 2.7 (L) 08/20/2018 1028   AST 12 (L) 08/20/2018 1028   ALT 19 08/20/2018 1028   ALKPHOS 152 (H) 08/20/2018 1028   BILITOT 0.6 08/20/2018 1028   GFRNONAA 46 (L) 08/20/2018 1028   GFRAA 54 (L) 08/20/2018 1028    No results found for: SPEP, UPEP  Lab Results  Component Value Date   WBC 4.1 08/20/2018   NEUTROABS 3.3 08/20/2018   HGB 12.0 (L) 08/20/2018   HCT 35.6 (L) 08/20/2018   MCV 96.2 08/20/2018   PLT 92 (L) 08/20/2018      Chemistry      Component Value Date/Time   NA 138 08/20/2018 1028   K 4.4 08/20/2018 1028   CL 104 08/20/2018 1028   CO2 26 08/20/2018 1028   BUN 40 (H) 08/20/2018 1028  CREATININE 1.37 (H) 08/20/2018 1028      Component Value Date/Time   CALCIUM 8.6 (L) 08/20/2018 1028   ALKPHOS 152 (H)  08/20/2018 1028   AST 12 (L) 08/20/2018 1028   ALT 19 08/20/2018 1028   BILITOT 0.6 08/20/2018 1028       RADIOGRAPHIC STUDIES: I have personally reviewed the radiological images as listed and agreed with the findings in the report. Ct Head Wo Contrast  Result Date: 07/31/2018 CLINICAL DATA:  Nausea and vomiting after radiation yesterday. EXAM: CT HEAD WITHOUT CONTRAST TECHNIQUE: Contiguous axial images were obtained from the base of the skull through the vertex without intravenous contrast. COMPARISON:  CT head 07/13/2018. MRI brain 07/24/2018. FINDINGS: Brain: Circumscribed hyperdense mass again demonstrated in the right basal ganglia measuring 2.7 x 1.7 cm. Large amount of surrounding white matter edema. Focal sub falcine right to left midline shift of about 7 mm. Effacement of the right lateral ventricles. Similar appearance to previous study. Underlying cerebral atrophy and ventricular dilatation consistent with central atrophy. No new intracranial hemorrhage. Vascular: Mild intracranial arterial vascular calcifications are present. Skull: Calvarium appears intact. Sinuses/Orbits: Paranasal sinuses and mastoid air cells are clear. Other: None. IMPRESSION: 1. Circumscribed hyperdense mass in the right basal ganglia measuring 2.7 x 1.7 cm with large amount of surrounding white matter edema. Focal sub falcine right to left midline shift of about 7 mm. No significant change since prior study. 2. No new intracranial hemorrhage. Electronically Signed   By: Lucienne Capers M.D.   On: 07/31/2018 21:53   Mr Wyatt Stein [facility] Contrast  Addendum Date: 07/30/2018   ADDENDUM REPORT: 07/30/2018 19:09 ADDENDUM: The dural-based lesion adjacent to the frontal lobe is on the left, not the right. It is stable in measurement since 2014 at 5 x 9 x 10 mm. This most likely represents a meningioma. The 2 right frontal lobe lesions are worrisome for metastatic disease. Electronically Signed   By: San Morelle M.D.    On: 07/30/2018 19:09   Result Date: 07/30/2018 CLINICAL DATA:  Lung cancer. 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 EXAM: MRI HEAD WITHOUT AND WITH CONTRAST TECHNIQUE: Multiplanar, multiecho pulse sequences of the brain and surrounding structures were obtained without and with intravenous contrast. CONTRAST:  48m MULTIHANCE GADOBENATE DIMEGLUMINE 529 MG/ML IV SOLN COMPARISON:  MRI brain 07/13/2018. FINDINGS: BRAIN New Lesions: Initial diagnosis of 2 lesions. 27 x 22 x 19 mm enhancing lesion located the anterior right basal ganglia. Lesion centered at caudate head anterior limb capsule with major surrounding edema and moderate mass effect seen on series 10, image 83. 2 mm enhancing lesion located more anteriorly within right frontal lobe white matter with major surrounding edema and no mass effect seen on series 10, image 83. Other Brain findings: A dural-based lesion anteriorly in the right frontal lobe measures 5 x 9 x 10 mm. Extensive vasogenic edema surrounding the right basal ganglia lesion results in significant mass effect with 6 mm right to left subfalcine herniation and midline shift. No acute infarct or hemorrhage is present. Moderate periventricular and subcortical white matter changes are present bilaterally Vascular: Flow is present in the major intracranial arteries. Skull and upper cervical spine: The skull base is within normal limits. The craniocervical junction is normal. Degenerative changes are present at C3-4 greater than C2-3. Marrow signal is within normal limits otherwise. Sinuses/Orbits: The paranasal sinuses mastoid air cells are clear. Globes and orbits are within normal limits. IMPRESSION: 1. Total of 2 enhancing brain  metastases, each annotated on series. 2. The dominant lesion is in the anterior basal ganglia involving the caudate head and anterior limb internal capsule measuring at 27 x 22 x 19 mm. This likely represents metastatic lesion is patient with known lung  cancer. 3. 2 mm anterior right frontal lobe lesion also likely represents focal metastasis. 4. Significant vasogenic edema surrounds the dominant lesion with mass effect including 6 mm of right-to-left subfalcine herniation and midline shift 5. Acute intracranial abnormality in the interval. 6. Extensive periventricular and subcortical white matter changes bilaterally likely reflects the sequela of chronic microvascular ischemia. Electronically Signed: By: San Morelle M.D. On: 07/24/2018 14:42    All questions were answered. The patient knows to call the clinic with any problems, questions or concerns. No barriers to learning was detected.  I spent 30 minutes counseling the patient face to face. The total time spent in the appointment was 40 minutes and more than 50% was on counseling and review of test results  [name], MD 08/20/2018 2:04 PM

## 2018-08-20 NOTE — Progress Notes (Signed)
START ON PATHWAY REGIMEN - Non-Small Cell Lung     A cycle is every 21 days:     Carboplatin      Gemcitabine   **Always confirm dose/schedule in your pharmacy ordering system**  Patient Characteristics: Stage IV Metastatic, Squamous, PS = 0, 1, First Line, Not a Candidate for Immunotherapy AJCC T Category: T1 Current Disease Status: Distant Metastases AJCC N Category: N2 AJCC M Category: M1 AJCC 8 Stage Grouping: IV Histology: Squamous Cell Line of therapy: First Line PD-L1 Expression Status: PD-L1 Negative Performance Status: PS = 0, 1 Immunotherapy Candidate Status: Not a Candidate for Immunotherapy Intent of Therapy: Non-Curative / Palliative Intent, Discussed with Patient

## 2018-08-20 NOTE — Assessment & Plan Note (Signed)
He denies further seizures I refilled his prescription of antiseizure medications I would defer to radiation oncologist to schedule follow-up brain imaging study

## 2018-08-21 ENCOUNTER — Ambulatory Visit: Payer: Medicare Other

## 2018-08-22 ENCOUNTER — Ambulatory Visit: Payer: Medicare Other

## 2018-08-23 ENCOUNTER — Ambulatory Visit: Payer: Medicare Other

## 2018-08-26 ENCOUNTER — Ambulatory Visit: Payer: Medicare Other

## 2018-08-27 ENCOUNTER — Ambulatory Visit: Payer: Medicare Other

## 2018-08-28 ENCOUNTER — Ambulatory Visit: Payer: Medicare Other

## 2018-08-29 ENCOUNTER — Ambulatory Visit: Payer: Medicare Other

## 2018-08-30 ENCOUNTER — Ambulatory Visit: Payer: Medicare Other

## 2018-09-02 ENCOUNTER — Telehealth: Payer: Self-pay | Admitting: Radiation Oncology

## 2018-09-02 ENCOUNTER — Telehealth: Payer: Self-pay | Admitting: *Deleted

## 2018-09-02 ENCOUNTER — Ambulatory Visit
Admission: RE | Admit: 2018-09-02 | Discharge: 2018-09-02 | Disposition: A | Discharge status: Home / Self Care | Payer: Medicare Other | Source: Ambulatory Visit | Attending: Radiation Oncology | Admitting: Radiation Oncology

## 2018-09-02 ENCOUNTER — Encounter: Payer: Self-pay | Admitting: Radiation Oncology

## 2018-09-02 ENCOUNTER — Ambulatory Visit: Payer: Medicare Other

## 2018-09-02 ENCOUNTER — Other Ambulatory Visit: Payer: Self-pay

## 2018-09-02 ENCOUNTER — Encounter: Payer: Self-pay | Admitting: *Deleted

## 2018-09-02 ENCOUNTER — Inpatient Hospital Stay: Payer: Medicare Other

## 2018-09-02 ENCOUNTER — Ambulatory Visit (HOSPITAL_COMMUNITY)
Admission: RE | Admit: 2018-09-02 | Discharge: 2018-09-02 | Disposition: A | Discharge status: Home / Self Care | Payer: Medicare Other | Source: Ambulatory Visit | Attending: Radiation Oncology | Admitting: Radiation Oncology

## 2018-09-02 VITALS — BP 141/75 | HR 85 | Temp 100.0°F | Resp 20 | Ht 68.0 in | Wt 144.4 lb

## 2018-09-02 DIAGNOSIS — C7931 Secondary malignant neoplasm of brain: Secondary | ICD-10-CM | POA: Diagnosis not present

## 2018-09-02 DIAGNOSIS — R600 Localized edema: Secondary | ICD-10-CM

## 2018-09-02 DIAGNOSIS — R569 Unspecified convulsions: Secondary | ICD-10-CM | POA: Insufficient documentation

## 2018-09-02 DIAGNOSIS — C3491 Malignant neoplasm of unspecified part of right bronchus or lung: Secondary | ICD-10-CM

## 2018-09-02 DIAGNOSIS — C3411 Malignant neoplasm of upper lobe, right bronchus or lung: Secondary | ICD-10-CM | POA: Diagnosis not present

## 2018-09-02 DIAGNOSIS — Z79899 Other long term (current) drug therapy: Secondary | ICD-10-CM | POA: Diagnosis not present

## 2018-09-02 DIAGNOSIS — I82411 Acute embolism and thrombosis of right femoral vein: Secondary | ICD-10-CM | POA: Diagnosis not present

## 2018-09-02 MED ORDER — RIVAROXABAN (XARELTO) VTE STARTER PACK (15 & 20 MG): ORAL_TABLET | ORAL | 0 refills | Status: DC

## 2018-09-02 NOTE — Telephone Encounter (Signed)
Confirmed with pharmacy they have rx for xarelto

## 2018-09-02 NOTE — Addendum Note (Signed)
Encounter addended by: Malena Edman, RN on: 09/02/2018 10:58 AM  Actions taken: Charge Capture section accepted

## 2018-09-02 NOTE — Telephone Encounter (Signed)
XXXX 

## 2018-09-02 NOTE — Telephone Encounter (Signed)
I spoke with radiology in vascular lab and the patient has a RLE DVT. We discussed the use of starting Xarelto for this and I've confirmed with Dr. Alvy Bimler we can start oral 10A inhibitors. This will be called to his pharmacy.

## 2018-09-02 NOTE — Progress Notes (Signed)
Right lower extremity venous duplex has been completed. There is evidence of acute deep vein thrombosis involving the femoral, popliteal veins, and the tibioperoneal trunk of the right lower extremity. Results were given to Shona Simpson PA.  09/02/18 1:13 PM Wyatt Stein RVT

## 2018-09-02 NOTE — Telephone Encounter (Signed)
CALLED PATIENT TO INFORM OF DOPPLER FOR 09-02-18 - REPORT TO [facility] ADMITTING @ 1:45 PM FOR 2 PM DOPPLER, LVM FOR A RETURN CALL

## 2018-09-02 NOTE — Telephone Encounter (Signed)
CALLED PATIENT TO INFORM OF APPT. FOR DOPPLER ON 09-02-18 - ARRIVAL TIME - 1:45 PM @ [facility] ADMITTING, GAVE PATIENT AN APPT. CARD

## 2018-09-02 NOTE — Progress Notes (Signed)
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  Interval Since Last Radiation:  2 weeks   08/07/18-08/20/18: 30 Gy in 10 fractions to the RUL  07/30/18 SRS Treatment:  PTV1 Rt Frontal 27mm 18 Gy PTV2 Rt Frontal 55mm   20 Gy  Narrative:  The patient returns today for routine follow-up. During treatment he did very well with radiotherapy and did not have significant desquamation.  In summary this is a pleasant 84 year old gentleman, retired Sales executive, who was diagnosed with stage IV lung cancer, squamous cell carcinoma.  He was found to have brain metastases at presentation.  Also of note he does have a history of alcoholism, as well as a history of focal seizures and episodes are described as shaking of the left arm with extension.  Apparently he has had episodes of this off and on since 2012.  He continues under the care of Dr. Delice Lesch and is currently on Keppra.  When he was found to have his lung cancer, he was started on dexamethasone due to edema from his disease, he underwent SRS on 07/30/2018, and completed a palliative course to the right upper lobe.  He comes today for follow-up, and is getting ready to pursue systemic therapy.             On review of systems, the patient states he is doing well.  He denies any esophageal irritation, chest wall disturbances of the skin, or headaches.  He reports that he has been tapering his steroid, and is currently taking dexamethasone 2 mg every other day, he ends this course next Tuesday, September 10, 2018.  He is anxious to proceed with his chemo education class and is questioning what types of symptoms and side effects he might anticipate.  He is scheduled to undergo  Port-A-Cath placement next Tuesday as well.  ALLERGIES:  is allergic to ace inhibitors and ramipril.  Meds: Current Outpatient Medications  Medication Sig Dispense Refill  . amLODipine (NORVASC) 5 MG tablet Take 5 mg by mouth daily.  5  . benzonatate (TESSALON) 200 MG capsule Take 1 capsule by mouth 3 (three) times daily as needed for cough.   0  . cholecalciferol (VITAMIN D) 1000 UNITS tablet Take 1,000 Units by mouth daily.    . [employer]S B-1 100 MG tablet Take 100 mg by mouth daily.  11  . dexamethasone (DECADRON) 4 MG tablet Take 1 tablet (4 mg total) by mouth 4 (four) times daily. Taper per instructions 60 tablet 0  . latanoprost (XALATAN) 0.005 % ophthalmic solution Place 1 drop into both eyes at bedtime.   12  . levETIRAcetam (KEPPRA) 750 MG tablet Take 1 tablet (750 mg total) by mouth 2 (two) times daily. 60 tablet 9  . Magnesium 250 MG TABS Take 250 mg by mouth daily.     . Multiple Vitamins-Minerals (CENTRUM SILVER ADULT 50+) TABS Take 1 tablet by mouth daily.    . nicotine polacrilex (CVS NICOTINE POLACRILEX) 2 MG gum Take 1 each (2 mg total) by mouth as needed for smoking cessation. 100 tablet 0  . vitamin B-12 (CYANOCOBALAMIN) 1000 MCG tablet Take 1,000 mcg by mouth daily.    . ondansetron (ZOFRAN) 8 MG tablet Take  1 tablet (8 mg total) by mouth every 8 (eight) hours as needed for nausea. (Patient not taking: Reported on 09/02/2018) 30 tablet 3  . prochlorperazine (COMPAZINE) 10 MG tablet Take 1 tablet (10 mg total) by mouth every 6 (six) hours as needed for nausea or vomiting. (Patient not taking: Reported on 09/02/2018) 30 tablet 3   No current facility-administered medications for this encounter.     Physical Findings:  height is 5\' 8"  (1.727 m) and weight is 144 lb 6.4 oz (65.5 kg). His oral temperature is 100 F (37.8 C). His blood pressure is 141/75 (abnormal) and his pulse is 85. His respiration is 20 and oxygen saturation is 100%.  Pain Assessment Pain Score: 0-No  pain/10 In general this is a well appearing African American male in no acute distress. She's alert and oriented x4 and appropriate throughout the examination. Cardiopulmonary assessment is negative for acute distress with RRR, no C/R/M are noted. Chest is clear to auscultation bilaterally. He has R>L LE edema 2+ vs. Trace to 1+ on the left. No deep calf tenderness is noted.  Lab Findings: Lab Results  Component Value Date   WBC 4.1 08/20/2018   HGB 12.0 (L) 08/20/2018   HCT 35.6 (L) 08/20/2018   MCV 96.2 08/20/2018   PLT 92 (L) 08/20/2018     Radiographic Findings: No results found.  Impression/Plan: 1. Stage IV, [id number], NSCLC, squamous cell carcinoma of the lung with brain metastases.  As above the patient is moving forward with his plans to begin systemic therapy.  We will follow this expectantly, and proceed with continued follow-up because of his brain disease in our brain oncology conference.  He will be due for his first MRI of the brain in December 2019.  I gave him precautions to call if he has any questions or concerns regarding his prior treatment to the lung and brain. 2. RLE edema.  The patient does have more noticeable right lower extremity edema.  Given his history of cancer, I have recommended that we rule out DVT, we also discussed the fact that he is on amlodipine and this is a common side effect of this drug regimen.  If he does not have a DVT, he has been encouraged to call and talk with his primary provider about whether he should continue this regimen or switch to a different antihypertensive. 3. Seizures.  The patient reports that he has been seizure-free, again he follows with Dr. Delice Lesch given his focal seizures and continues to take [place].  I will copy Dr. Delice Lesch as the patient missed his last appointment with her due to ongoing radiation treatment.    Carola Rhine, PAC

## 2018-09-03 ENCOUNTER — Encounter (HOSPITAL_COMMUNITY): Payer: Self-pay

## 2018-09-03 ENCOUNTER — Ambulatory Visit: Payer: Medicare Other

## 2018-09-03 ENCOUNTER — Other Ambulatory Visit: Payer: Self-pay

## 2018-09-03 ENCOUNTER — Telehealth: Payer: Self-pay

## 2018-09-03 ENCOUNTER — Emergency Department (HOSPITAL_COMMUNITY): Payer: Medicare Other

## 2018-09-03 ENCOUNTER — Telehealth: Payer: Self-pay | Admitting: Radiation Therapy

## 2018-09-03 ENCOUNTER — Emergency Department (HOSPITAL_COMMUNITY)
Admission: EM | Admit: 2018-09-03 | Discharge: 2018-09-03 | Disposition: A | Discharge status: Home / Self Care | Payer: Medicare Other | Attending: Emergency Medicine | Admitting: Emergency Medicine

## 2018-09-03 DIAGNOSIS — G40909 Epilepsy, unspecified, not intractable, without status epilepticus: Secondary | ICD-10-CM | POA: Diagnosis not present

## 2018-09-03 DIAGNOSIS — R55 Syncope and collapse: Secondary | ICD-10-CM | POA: Diagnosis not present

## 2018-09-03 DIAGNOSIS — F17210 Nicotine dependence, cigarettes, uncomplicated: Secondary | ICD-10-CM | POA: Diagnosis not present

## 2018-09-03 DIAGNOSIS — R112 Nausea with vomiting, unspecified: Secondary | ICD-10-CM | POA: Diagnosis not present

## 2018-09-03 DIAGNOSIS — R569 Unspecified convulsions: Secondary | ICD-10-CM | POA: Insufficient documentation

## 2018-09-03 DIAGNOSIS — R1110 Vomiting, unspecified: Secondary | ICD-10-CM | POA: Diagnosis not present

## 2018-09-03 DIAGNOSIS — Z85118 Personal history of other malignant neoplasm of bronchus and lung: Secondary | ICD-10-CM | POA: Diagnosis not present

## 2018-09-03 DIAGNOSIS — I10 Essential (primary) hypertension: Secondary | ICD-10-CM | POA: Insufficient documentation

## 2018-09-03 LAB — CBC WITH DIFFERENTIAL/PLATELET
ABS IMMATURE GRANULOCYTES: 0.12 10*3/uL — AB (ref 0.00–0.07)
Basophils Absolute: 0 10*3/uL (ref 0.0–0.1)
Basophils Relative: 0 %
EOS PCT: 0 %
Eosinophils Absolute: 0 10*3/uL (ref 0.0–0.5)
HCT: 39.7 % (ref 39.0–52.0)
HEMOGLOBIN: 13.4 g/dL (ref 13.0–17.0)
IMMATURE GRANULOCYTES: 2 %
LYMPHS ABS: 0.3 10*3/uL — AB (ref 0.7–4.0)
LYMPHS PCT: 4 %
MCH: 32.3 pg (ref 26.0–34.0)
MCHC: 33.8 g/dL (ref 30.0–36.0)
MCV: 95.7 fL (ref 80.0–100.0)
MONO ABS: 0.5 10*3/uL (ref 0.1–1.0)
Monocytes Relative: 7 %
NEUTROS ABS: 5.7 10*3/uL (ref 1.7–7.7)
Neutrophils Relative %: 87 %
Platelets: UNDETERMINED 10*3/uL (ref 150–400)
RBC: 4.15 MIL/uL — ABNORMAL LOW (ref 4.22–5.81)
RDW: 17.3 % — ABNORMAL HIGH (ref 11.5–15.5)
WBC: 6.6 10*3/uL (ref 4.0–10.5)
nRBC: 0 % (ref 0.0–0.2)

## 2018-09-03 LAB — COMPREHENSIVE METABOLIC PANEL
ALK PHOS: 102 U/L (ref 38–126)
ALT: 22 U/L (ref 0–44)
AST: 30 U/L (ref 15–41)
Albumin: 3.6 g/dL (ref 3.5–5.0)
Anion gap: 10 (ref 5–15)
BUN: 34 mg/dL — AB (ref 8–23)
CALCIUM: 9.1 mg/dL (ref 8.9–10.3)
CHLORIDE: 95 mmol/L — AB (ref 98–111)
CO2: 28 mmol/L (ref 22–32)
CREATININE: 1.46 mg/dL — AB (ref 0.61–1.24)
GFR calc non Af Amer: 43 mL/min — ABNORMAL LOW (ref >60)
GFR, EST AFRICAN AMERICAN: 50 mL/min — AB (ref >60)
Glucose, Bld: 129 mg/dL — ABNORMAL HIGH (ref 70–99)
Potassium: 4 mmol/L (ref 3.5–5.1)
SODIUM: 133 mmol/L — AB (ref 135–145)
Total Bilirubin: 1 mg/dL (ref 0.3–1.2)
Total Protein: 7.2 g/dL (ref 6.5–8.1)

## 2018-09-03 LAB — TYPE AND SCREEN
ABO/RH(D): B POS
Antibody Screen: NEGATIVE

## 2018-09-03 LAB — LIPASE, BLOOD: Lipase: 37 U/L (ref 11–51)

## 2018-09-03 MED ORDER — LIDOCAINE-PRILOCAINE 2.5-2.5 % EX CREA: 1.0000 "application " | TOPICAL_CREAM | CUTANEOUS | 0 refills | Status: DC | PRN

## 2018-09-03 MED ORDER — ONDANSETRON HCL 4 MG/2ML IJ SOLN
4.0000 mg | Freq: Once | INTRAMUSCULAR | Status: AC
  Administered 2018-09-03: 4 mg via INTRAVENOUS
  Filled 2018-09-03: qty 2

## 2018-09-03 MED ORDER — LORAZEPAM 2 MG/ML IJ SOLN
0.5000 mg | Freq: Once | INTRAMUSCULAR | Status: AC
  Administered 2018-09-03: 0.5 mg via INTRAVENOUS
  Filled 2018-09-03: qty 1

## 2018-09-03 MED ORDER — SODIUM CHLORIDE 0.9 % IV SOLN
INTRAVENOUS | Status: DC
  Administered 2018-09-03: 13:00:00 via INTRAVENOUS

## 2018-09-03 MED ORDER — LEVETIRACETAM IN NACL 500 MG/100ML IV SOLN
500.0000 mg | Freq: Once | INTRAVENOUS | Status: AC
  Administered 2018-09-03: 500 mg via INTRAVENOUS
  Filled 2018-09-03: qty 100

## 2018-09-03 NOTE — ED Notes (Signed)
Occult blood resulted by a Non mini lab trained person. I cannot process results I did not run

## 2018-09-03 NOTE — ED Notes (Signed)
ED Provider at bedside. 

## 2018-09-03 NOTE — ED Notes (Signed)
Bed: BZ96 Expected date:  Expected time:  Means of arrival:  Comments: Triage

## 2018-09-03 NOTE — ED Provider Notes (Signed)
I received this patient in signout from Dr. Zenia Resides.  He had been evaluated for some vomiting today that was concerning for possible coffee-ground emesis.  Later the vomiting was observed and it was noted that the emesis did not contain blood.  His work-up was reassuring against acute GI bleeding.  Concern for possible seizure activity and he has had episode in the past similar that was related to seizures therefore was given Keppra.  Plan was to observe for return to normal mentation.  On reassessment, the patient is awake and alert.  He has been able to ambulate with a walker which family reports is his baseline.  His wife feels that he has significantly improved and she feels comfortable taking him home.  Patient wants to go home.  I have instructed to contact his neurologist for follow-up appointment.  I have extensively reviewed return precautions with the family and they have voiced understanding.   Abigail Teall, Wenda Overland, MD 09/03/18 [phone]

## 2018-09-03 NOTE — ED Notes (Signed)
MD made aware POC occult blood results will not result in chart.

## 2018-09-03 NOTE — Telephone Encounter (Signed)
Wife called and left a message to call her about her husband.  Called back. Her husband vomited at around 0630 this morning about a 1/3 of a cup of dark brown emesis. Nausea better after vomiting. He is very weak. Above message given to Dr. Alvy Bimler. Instructed wife to take him to ER to be evaluated. She verbalized understanding.

## 2018-09-03 NOTE — ED Triage Notes (Signed)
Pt completed radiation on brain and chest for lung CA on 08/23/18. This AM, pt was disoriented per wife. Pt states that wife found pt on floor, laying on left leg. Per wife, pt had dark brown emesis. Pt also c/o weakness.

## 2018-09-03 NOTE — Discharge Instructions (Signed)
RETURN TO THE ER IF ANY OF YOUR SYMPTOMS WORSEN, IF YOU HAVE MORE SEIZURE-LIKE ACTIVITY, OR IF YOU HAVE ANY SUDDEN NEW SYMPTOMS LIKE STROKE-LIKE SYMPTOMS, FEVER, ABDOMINAL PAIN, OR BLEEDING.

## 2018-09-03 NOTE — ED Provider Notes (Addendum)
[facility] DEPT Provider Note   CSN: [id number] Arrival date & time: 09/03/18  1118     History   Chief Complaint Chief Complaint  Patient presents with  . Weakness  . Hemoptysis    HPI Wyatt Stein is a 84 y.o. male.  84 year old male with history of lung cancer presents with hematemesis x1.  Denies any black stools.  No prior history of upper GI bleeding.  Patient has been experiencing weakness times several days that is worse with standing.  He does take Xarelto.  Has not had any further episodes.  No treatment used prior to arrival.     Past Medical History:  Diagnosis Date  . Cancer [facility]) 2008   prostate, sees dr [name] once a year  . COPD (chronic obstructive pulmonary disease) ([place])   . GERD (gastroesophageal reflux disease)   . History of blood transfusion 2006  . Hypertension   . PONV (postoperative nausea and vomiting) 40 yrs ago   aspiration pneumonia after tooth extraxtion    Patient Active Problem List   Diagnosis Date Noted  . Acquired pancytopenia ([place]) 08/20/2018  . Physical debility 08/20/2018  . Bilateral leg edema 08/20/2018  . Nausea without vomiting 08/06/2018  . Bronchogenic cancer of right lung ([place]) 07/19/2018  . Goals of care, counseling/discussion 07/19/2018  . Other constipation 07/19/2018  . Brain metastases ([place]) 07/13/2018  . Lung mass 06/26/2018  . Memory changes 08/08/2017  . Localization-related idiopathic epilepsy and epileptic syndromes with seizures of localized onset, not intractable, without status epilepticus ([place]) 11/01/2015  . Edema 09/05/2015  . COPD GOLD 0/ still smoking 08/27/2015  . Postoperative anemia due to acute blood loss 08/27/2015  . Vitamin D deficiency 08/27/2015  . Malnutrition of moderate degree 08/20/2015  . Convulsions ([place])   . Syncope and collapse 08/15/2015  . Anemia of chronic disease 08/15/2015  . Hypokalemia 08/15/2015  . Dehydration 08/15/2015  . Diarrhea  08/15/2015  . QT prolongation 08/15/2015  . Alcohol abuse 08/15/2015  . Anxiety 08/15/2015  . Syncope 08/15/2015  . Closed left hip fracture ([place])   . Seizure [facility])     Past Surgical History:  Procedure Laterality Date  . back fracture surgery  Lumbar 2006   screws and , back brace prn  . BACK SURGERY    . COLONOSCOPY WITH PROPOFOL N/A 07/01/2013   Procedure: COLONOSCOPY WITH PROPOFOL;  Surgeon: Garlan Fair, MD;  Location: [facility];  Service: Endoscopy;  Laterality: N/A;  . ESOPHAGOGASTRODUODENOSCOPY (EGD) WITH PROPOFOL N/A 07/01/2013   Procedure: ESOPHAGOGASTRODUODENOSCOPY (EGD) WITH PROPOFOL;  Surgeon: Garlan Fair, MD;  Location: [facility];  Service: Endoscopy;  Laterality: N/A;  . INTRAMEDULLARY (IM) NAIL INTERTROCHANTERIC Left 08/16/2015   Procedure: INTRAMEDULLARY (IM) NAIL INTERTROCHANTRIC;  Surgeon: Mcarthur Rossetti, MD;  Location: [facility];  Service: Orthopedics;  Laterality: Left;  . seed implant for prostate cancer    . TONSILLECTOMY    . VIDEO BRONCHOSCOPY Bilateral 07/02/2018   Procedure: VIDEO BRONCHOSCOPY WITH FLUORO;  Surgeon: Tanda Rockers, MD;  Location: [facility];  Service: Cardiopulmonary;  Laterality: Bilateral;        Home Medications    Prior to Admission medications   Medication Sig Start Date End Date Taking? Authorizing Provider  amLODipine (NORVASC) 5 MG tablet Take 5 mg by mouth daily. 01/18/18  Yes [provider]  benzonatate (TESSALON) 200 MG capsule Take 1 capsule by mouth 3 (three) times daily as needed for cough.  06/19/18  Yes  [provider]  cholecalciferol (VITAMIN D) 1000 UNITS tablet Take 1,000 Units by mouth daily.   Yes [provider]  CVS B-1 100 MG tablet Take 100 mg by mouth daily. 07/17/15  Yes [provider]  dexamethasone (DECADRON) 4 MG tablet Take 1 tablet (4 mg total) by mouth 4 (four) times daily. Taper per instructions 07/31/18  Yes Hayden Pedro, PA-C  latanoprost  (XALATAN) 0.005 % ophthalmic solution Place 1 drop into both eyes at bedtime.  10/20/15  Yes [provider]  levETIRAcetam (KEPPRA) 750 MG tablet Take 1 tablet (750 mg total) by mouth 2 (two) times daily. 08/20/18  Yes Gorsuch, Ni, MD  Magnesium 250 MG TABS Take 250 mg by mouth daily.    Yes [provider]  Multiple Vitamins-Minerals (CENTRUM SILVER ADULT 50+) TABS Take 1 tablet by mouth daily.   Yes [provider]  Rivaroxaban 15 & 20 MG TBPK Take one 15mg  tablet by mouth twice a day with food. On Day 22, switch to one 20mg  tablet once a day with food. 09/02/18  Yes Hayden Pedro, PA-C  vitamin B-12 (CYANOCOBALAMIN) 1000 MCG tablet Take 1,000 mcg by mouth daily.   Yes [provider]  nicotine polacrilex (CVS NICOTINE POLACRILEX) 2 MG gum Take 1 each (2 mg total) by mouth as needed for smoking cessation. Patient not taking: Reported on 09/03/2018 08/06/18   [name], MD  ondansetron (ZOFRAN) 8 MG tablet Take 1 tablet (8 mg total) by mouth every 8 (eight) hours as needed for nausea. Patient not taking: Reported on 09/02/2018 08/06/18   [name], MD  prochlorperazine (COMPAZINE) 10 MG tablet Take 1 tablet (10 mg total) by mouth every 6 (six) hours as needed for nausea or vomiting. Patient not taking: Reported on 09/02/2018 08/06/18   [name], MD    Family History Family History  Problem Relation Age of Onset  . Heart Problems Mother   . Colon cancer Father     Social History Social History   Tobacco Use  . Smoking status: Current Every Day Smoker    Packs/day: 0.50    Years: 60.00    Pack years: 30.00    Types: Cigarettes  . Smokeless tobacco: Never Used  Substance Use Topics  . Alcohol use: No    Alcohol/week: 14.0 standard drinks    Types: 14 Shots of liquor per week    Comment: Former--pt states none since Oct  . Drug use: No     Allergies   Ace inhibitors and Ramipril   Review of Systems Review of Systems  All other  systems reviewed and are negative.    Physical Exam Updated Vital Signs BP (!) 170/99 (BP Location: Right Arm)   Pulse (!) 102   Temp 98.3 F (36.8 C) (Oral)   Resp 16   Ht 1.727 m (5\' 8" )   Wt 65.5 kg   SpO2 100%   BMI 21.96 kg/m   Physical Exam  Constitutional: He is oriented to person, place, and time. He appears well-developed and well-nourished.  Non-toxic appearance. No distress.  HENT:  Head: Normocephalic and atraumatic.  Eyes: Pupils are equal, round, and reactive to light. Conjunctivae, EOM and lids are normal.  Neck: Normal range of motion. Neck supple. No tracheal deviation present. No thyroid mass present.  Cardiovascular: Regular rhythm and normal heart sounds. Tachycardia present. Exam reveals no gallop.  No murmur heard. Pulmonary/Chest: Effort normal and breath sounds normal. No stridor. No respiratory distress. He  has no decreased breath sounds. He has no wheezes. He has no rhonchi. He has no rales.  Abdominal: Soft. Normal appearance and bowel sounds are normal. He exhibits no distension. There is no tenderness. There is no rigidity, no rebound, no guarding and no CVA tenderness.  Musculoskeletal: Normal range of motion. He exhibits no edema or tenderness.  Neurological: He is alert and oriented to person, place, and time. He has normal strength. No cranial nerve deficit or sensory deficit. GCS eye subscore is 4. GCS verbal subscore is 5. GCS motor subscore is 6.  Skin: Skin is warm and dry. No abrasion and no rash noted.  Psychiatric: His speech is normal. His affect is blunt. He is withdrawn.  Nursing note and vitals reviewed.    ED Treatments / Results  Labs (all labs ordered are listed, but only abnormal results are displayed) Labs Reviewed  CBC WITH DIFFERENTIAL/PLATELET  COMPREHENSIVE METABOLIC PANEL  LIPASE, BLOOD  TYPE AND SCREEN    EKG None  Radiology No results found.  Procedures Procedures (including critical care time)  Medications  Ordered in ED Medications  0.9 %  sodium chloride infusion (has no administration in time range)     Initial Impression / Assessment and Plan / ED Course  I have reviewed the triage vital signs and the nursing notes.  Pertinent labs & imaging results that were available during my care of the patient were reviewed by me and considered in my medical decision making (see chart for details).     Patient had emesis here that was nonbilious or bloody.  Patient denied any abdominal discomfort.  Patient did have some body shaking and appeared to be confused afterwards.  The tremors were localized to the patient's right upper extremity.  He did receive 0.5 mg of Ativan.  Wife states that when he had a seizure in the past this was very similar.  Head CT performed and did not show any evidence of worsening intracranial pathology.  Patient was guaiac negative from below.  Suspect that patient possibly had a seizure today.  Has been on Keppra.  Will IV load with Keppra here and have patient follow-up at the cancer center.  Care will be signed out to Dr. Rex Kras  CRITICAL CARE Performed by: Leota Jacobsen Total critical care time: 60 minutes Critical care time was exclusive of separately billable procedures and treating other patients. Critical care was necessary to treat or prevent imminent or life-threatening deterioration. Critical care was time spent personally by me on the following activities: development of treatment plan with patient and/or surrogate as well as nursing, discussions with consultants, evaluation of patient's response to treatment, examination of patient, obtaining history from patient or surrogate, ordering and performing treatments and interventions, ordering and review of laboratory studies, ordering and review of radiographic studies, pulse oximetry and re-evaluation of patient's condition.   Final Clinical Impressions(s) / ED Diagnoses   Final diagnoses:  None    ED  Discharge Orders    None       Lacretia Leigh, MD 09/03/18 1610    Lacretia Leigh, MD 09/03/18 [phone]

## 2018-09-03 NOTE — ED Notes (Signed)
CT made aware MD Zenia Resides requesting CT head STAT.

## 2018-09-03 NOTE — Telephone Encounter (Addendum)
I received a call from Ms. Ohn Bostic letting us know that Wyatt Stein is very weak and not feeling well today. Earlier this morning he vomited and she is concerned that it may be dried blood, she described it as dark brown and resembling coffee grounds. He had not eaten this morning or had anything to drink prior to this, and it only happened once. She said it was a "medium" amount and asked if anything needs to be done.   This has been shared with Dr. Lisbeth Renshaw, Shona Simpson PA-C, and nurse Phineas Real, for advisement.     Mont Dutton R.T.(R)(T) Special Procedures Navigator

## 2018-09-04 ENCOUNTER — Ambulatory Visit: Admission: RE | Admit: 2018-09-04 | Payer: Medicare Other | Source: Ambulatory Visit

## 2018-09-05 ENCOUNTER — Telehealth: Payer: Self-pay

## 2018-09-05 ENCOUNTER — Ambulatory Visit: Payer: Medicare Other

## 2018-09-05 NOTE — Telephone Encounter (Signed)
-----   Message from Cameron Sprang, MD sent at 09/05/2018 12:23 AM EDT ----- Regarding: f/u He was in ER recently, I have an opening on 10/29 Tues at 1:30 if he can come in with his wife, thanks!

## 2018-09-05 NOTE — Telephone Encounter (Signed)
Called pt to see if he could come to the office at date/time listed below.  No answer.  VM box full.  Unable to leave message.

## 2018-09-05 NOTE — Progress Notes (Signed)
  Radiation Oncology         [phone]) [phone] ________________________________  Name: Wyatt Stein MRN: [id number]  Date: 08/20/2018  DOB: 1934-04-02  End of Treatment Note  Diagnosis:   84 y.o. 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  He experienced increased fatigue and non-productive cough. His appetite is diminished; his wife is encouraging nutritional supplements. No significant skin changes.  Plan: The patient has completed radiation treatment. The patient will return to radiation oncology clinic for routine followup in one month. I advised them to call or return sooner if they have any questions or concerns related to their recovery or treatment.  ------------------------------------------------  Jodelle Gross, MD, PhD  This document serves as a record of services personally performed by Kyung Rudd, MD. It was created on his behalf by Rae Lips, a trained medical scribe. The creation of this record is based on the scribe's personal observations and the provider's statements to them. This document has been checked and approved by the attending provider.

## 2018-09-06 ENCOUNTER — Ambulatory Visit: Payer: Medicare Other

## 2018-09-08 ENCOUNTER — Emergency Department (HOSPITAL_COMMUNITY): Payer: Medicare Other

## 2018-09-08 ENCOUNTER — Encounter (HOSPITAL_COMMUNITY): Payer: Self-pay | Admitting: Internal Medicine

## 2018-09-08 ENCOUNTER — Inpatient Hospital Stay (HOSPITAL_COMMUNITY)
Admission: EM | Admit: 2018-09-08 | Discharge: 2018-09-13 | DRG: 054 | Disposition: A | Discharge status: Skilled Nursing Facility | Payer: Medicare Other | Attending: Internal Medicine | Admitting: Internal Medicine

## 2018-09-08 ENCOUNTER — Other Ambulatory Visit: Payer: Self-pay | Admitting: Radiology

## 2018-09-08 DIAGNOSIS — G936 Cerebral edema: Secondary | ICD-10-CM | POA: Diagnosis not present

## 2018-09-08 DIAGNOSIS — R509 Fever, unspecified: Secondary | ICD-10-CM | POA: Diagnosis not present

## 2018-09-08 DIAGNOSIS — N183 Chronic kidney disease, stage 3 (moderate): Secondary | ICD-10-CM | POA: Diagnosis present

## 2018-09-08 DIAGNOSIS — R569 Unspecified convulsions: Secondary | ICD-10-CM

## 2018-09-08 DIAGNOSIS — G9340 Encephalopathy, unspecified: Secondary | ICD-10-CM | POA: Diagnosis present

## 2018-09-08 DIAGNOSIS — R627 Adult failure to thrive: Secondary | ICD-10-CM | POA: Diagnosis present

## 2018-09-08 DIAGNOSIS — Z86718 Personal history of other venous thrombosis and embolism: Secondary | ICD-10-CM

## 2018-09-08 DIAGNOSIS — Z66 Do not resuscitate: Secondary | ICD-10-CM | POA: Diagnosis present

## 2018-09-08 DIAGNOSIS — R531 Weakness: Secondary | ICD-10-CM | POA: Diagnosis not present

## 2018-09-08 DIAGNOSIS — C3491 Malignant neoplasm of unspecified part of right bronchus or lung: Secondary | ICD-10-CM | POA: Diagnosis not present

## 2018-09-08 DIAGNOSIS — R0689 Other abnormalities of breathing: Secondary | ICD-10-CM | POA: Diagnosis not present

## 2018-09-08 DIAGNOSIS — R0902 Hypoxemia: Secondary | ICD-10-CM | POA: Diagnosis not present

## 2018-09-08 DIAGNOSIS — Z79899 Other long term (current) drug therapy: Secondary | ICD-10-CM

## 2018-09-08 DIAGNOSIS — Z888 Allergy status to other drugs, medicaments and biological substances status: Secondary | ICD-10-CM

## 2018-09-08 DIAGNOSIS — C7931 Secondary malignant neoplasm of brain: Principal | ICD-10-CM | POA: Diagnosis present

## 2018-09-08 DIAGNOSIS — E875 Hyperkalemia: Secondary | ICD-10-CM | POA: Diagnosis present

## 2018-09-08 DIAGNOSIS — Z923 Personal history of irradiation: Secondary | ICD-10-CM

## 2018-09-08 DIAGNOSIS — C3490 Malignant neoplasm of unspecified part of unspecified bronchus or lung: Secondary | ICD-10-CM

## 2018-09-08 DIAGNOSIS — R4182 Altered mental status, unspecified: Secondary | ICD-10-CM | POA: Diagnosis not present

## 2018-09-08 DIAGNOSIS — F17210 Nicotine dependence, cigarettes, uncomplicated: Secondary | ICD-10-CM | POA: Diagnosis present

## 2018-09-08 DIAGNOSIS — Z7901 Long term (current) use of anticoagulants: Secondary | ICD-10-CM

## 2018-09-08 DIAGNOSIS — G40909 Epilepsy, unspecified, not intractable, without status epilepticus: Secondary | ICD-10-CM | POA: Diagnosis present

## 2018-09-08 DIAGNOSIS — K219 Gastro-esophageal reflux disease without esophagitis: Secondary | ICD-10-CM | POA: Diagnosis present

## 2018-09-08 DIAGNOSIS — E440 Moderate protein-calorie malnutrition: Secondary | ICD-10-CM | POA: Diagnosis not present

## 2018-09-08 DIAGNOSIS — J449 Chronic obstructive pulmonary disease, unspecified: Secondary | ICD-10-CM | POA: Diagnosis not present

## 2018-09-08 DIAGNOSIS — G9341 Metabolic encephalopathy: Secondary | ICD-10-CM | POA: Diagnosis present

## 2018-09-08 DIAGNOSIS — C801 Malignant (primary) neoplasm, unspecified: Secondary | ICD-10-CM | POA: Diagnosis not present

## 2018-09-08 DIAGNOSIS — Z6820 Body mass index (BMI) 20.0-20.9, adult: Secondary | ICD-10-CM

## 2018-09-08 DIAGNOSIS — D630 Anemia in neoplastic disease: Secondary | ICD-10-CM | POA: Diagnosis present

## 2018-09-08 DIAGNOSIS — R404 Transient alteration of awareness: Secondary | ICD-10-CM | POA: Diagnosis not present

## 2018-09-08 DIAGNOSIS — I160 Hypertensive urgency: Secondary | ICD-10-CM | POA: Diagnosis present

## 2018-09-08 DIAGNOSIS — Z515 Encounter for palliative care: Secondary | ICD-10-CM | POA: Diagnosis not present

## 2018-09-08 DIAGNOSIS — I82409 Acute embolism and thrombosis of unspecified deep veins of unspecified lower extremity: Secondary | ICD-10-CM | POA: Diagnosis present

## 2018-09-08 DIAGNOSIS — I129 Hypertensive chronic kidney disease with stage 1 through stage 4 chronic kidney disease, or unspecified chronic kidney disease: Secondary | ICD-10-CM | POA: Diagnosis present

## 2018-09-08 LAB — CBC WITH DIFFERENTIAL/PLATELET
Abs Immature Granulocytes: 0.15 10*3/uL — ABNORMAL HIGH (ref 0.00–0.07)
BASOS ABS: 0 10*3/uL (ref 0.0–0.1)
BASOS PCT: 0 %
Eosinophils Absolute: 0 10*3/uL (ref 0.0–0.5)
Eosinophils Relative: 0 %
HCT: 38.5 % — ABNORMAL LOW (ref 39.0–52.0)
Hemoglobin: 12.8 g/dL — ABNORMAL LOW (ref 13.0–17.0)
IMMATURE GRANULOCYTES: 2 %
Lymphocytes Relative: 8 %
Lymphs Abs: 0.5 10*3/uL — ABNORMAL LOW (ref 0.7–4.0)
MCH: 32.1 pg (ref 26.0–34.0)
MCHC: 33.2 g/dL (ref 30.0–36.0)
MCV: 96.5 fL (ref 80.0–100.0)
Monocytes Absolute: 0.8 10*3/uL (ref 0.1–1.0)
Monocytes Relative: 11 %
NEUTROS PCT: 79 %
NRBC: 0 % (ref 0.0–0.2)
Neutro Abs: 5.4 10*3/uL (ref 1.7–7.7)
PLATELETS: 227 10*3/uL (ref 150–400)
RBC: 3.99 MIL/uL — ABNORMAL LOW (ref 4.22–5.81)
RDW: 17.2 % — AB (ref 11.5–15.5)
WBC: 6.8 10*3/uL (ref 4.0–10.5)

## 2018-09-08 LAB — INFLUENZA PANEL BY PCR (TYPE A & B)
INFLBPCR: NEGATIVE
Influenza A By PCR: NEGATIVE

## 2018-09-08 LAB — URINALYSIS, ROUTINE W REFLEX MICROSCOPIC
BACTERIA UA: NONE SEEN
Bilirubin Urine: NEGATIVE
GLUCOSE, UA: NEGATIVE mg/dL
Ketones, ur: NEGATIVE mg/dL
Leukocytes, UA: NEGATIVE
Nitrite: NEGATIVE
Protein, ur: 100 mg/dL — AB
Specific Gravity, Urine: 1.013 (ref 1.005–1.030)
pH: 7 (ref 5.0–8.0)

## 2018-09-08 LAB — COMPREHENSIVE METABOLIC PANEL
ALK PHOS: 83 U/L (ref 38–126)
ALT: 21 U/L (ref 0–44)
ANION GAP: 10 (ref 5–15)
AST: 50 U/L — ABNORMAL HIGH (ref 15–41)
Albumin: 3.1 g/dL — ABNORMAL LOW (ref 3.5–5.0)
BILIRUBIN TOTAL: 1.7 mg/dL — AB (ref 0.3–1.2)
BUN: 31 mg/dL — ABNORMAL HIGH (ref 8–23)
CO2: 22 mmol/L (ref 22–32)
Calcium: 8.8 mg/dL — ABNORMAL LOW (ref 8.9–10.3)
Chloride: 102 mmol/L (ref 98–111)
Creatinine, Ser: 1.39 mg/dL — ABNORMAL HIGH (ref 0.61–1.24)
GFR calc non Af Amer: 46 mL/min — ABNORMAL LOW (ref >60)
GFR, EST AFRICAN AMERICAN: 53 mL/min — AB (ref >60)
Glucose, Bld: 114 mg/dL — ABNORMAL HIGH (ref 70–99)
Potassium: 5.8 mmol/L — ABNORMAL HIGH (ref 3.5–5.1)
SODIUM: 134 mmol/L — AB (ref 135–145)
TOTAL PROTEIN: 6.2 g/dL — AB (ref 6.5–8.1)

## 2018-09-08 LAB — I-STAT TROPONIN, ED: TROPONIN I, POC: 0.04 ng/mL (ref 0.00–0.08)

## 2018-09-08 LAB — I-STAT CG4 LACTIC ACID, ED: Lactic Acid, Venous: 1.08 mmol/L (ref 0.5–1.9)

## 2018-09-08 LAB — LIPASE, BLOOD: LIPASE: 31 U/L (ref 11–51)

## 2018-09-08 LAB — CBG MONITORING, ED: Glucose-Capillary: 124 mg/dL — ABNORMAL HIGH (ref 70–99)

## 2018-09-08 MED ORDER — SODIUM CHLORIDE 0.9 % IV SOLN
2.0000 g | Freq: Once | INTRAVENOUS | Status: AC
  Administered 2018-09-08: 2 g via INTRAVENOUS
  Filled 2018-09-08: qty 2

## 2018-09-08 MED ORDER — ACETAMINOPHEN 325 MG PO TABS: 650.0000 mg | ORAL_TABLET | Freq: Four times a day (QID) | ORAL | Status: DC | PRN

## 2018-09-08 MED ORDER — LATANOPROST 0.005 % OP SOLN
1.0000 [drp] | Freq: Every day | OPHTHALMIC | Status: DC
  Administered 2018-09-09 – 2018-09-12 (×3): 1 [drp] via OPHTHALMIC
  Filled 2018-09-08 (×2): qty 2.5

## 2018-09-08 MED ORDER — ACETAMINOPHEN 650 MG RE SUPP: 650.0000 mg | Freq: Four times a day (QID) | RECTAL | Status: DC | PRN

## 2018-09-08 MED ORDER — SODIUM CHLORIDE 0.9 % IV BOLUS
500.0000 mL | Freq: Once | INTRAVENOUS | Status: AC
  Administered 2018-09-08: 500 mL via INTRAVENOUS

## 2018-09-08 MED ORDER — VANCOMYCIN HCL IN DEXTROSE 1-5 GM/200ML-% IV SOLN
1000.0000 mg | Freq: Once | INTRAVENOUS | Status: AC
  Administered 2018-09-08: 1000 mg via INTRAVENOUS
  Filled 2018-09-08: qty 200

## 2018-09-08 MED ORDER — SODIUM CHLORIDE 0.9 % IV SOLN
Freq: Once | INTRAVENOUS | Status: AC
  Administered 2018-09-08: 22:00:00 via INTRAVENOUS

## 2018-09-08 NOTE — ED Provider Notes (Signed)
[facility] DEPT Provider Note   CSN: [id number] Arrival date & time: 09/08/18  1803     History   Chief Complaint Chief Complaint  Patient presents with  . Fever  . Weakness    HPI Wyatt Stein is a 84 y.o. male.  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  Patient was seen 5 days ago.  At that time family members reports that he was generally very weak.  He had thrown up once and it had bloody appearance.  After evaluation patient was discharged home.  Family members report he has continued to decline significantly.  For the past 3 days he is really minimally been able to get out of his recliner chair.  He was requiring full assistance.  Of last night, his wife reports he could not get up at all.  He had to leave him to sleep in the chair.  Reports she kept an eye on him and is been checking on it.  She reports he has become much more confused.  He is not making any sense and at times not responding at all.  They measured a temperature up to 101.  She has managed at times to get him to eat some food.  He reports is been very small amounts.  She reports he had radiation therapy that they said might swell his esophagus.  She reports he seems to have some difficulty swallowing when he eats but he has not choked on it. Past Medical History:  Diagnosis Date  . Cancer [facility]) 2008   prostate, sees dr [name] once a year  . COPD (chronic obstructive pulmonary disease) ([place])   . GERD (gastroesophageal reflux disease)   . History of blood transfusion 2006  . Hypertension   . PONV (postoperative nausea and vomiting) 40 yrs ago   aspiration pneumonia after tooth extraxtion    Patient Active Problem List   Diagnosis Date Noted  . Acquired pancytopenia ([place]) 08/20/2018  . Physical debility 08/20/2018  . Bilateral leg edema 08/20/2018  . Nausea without vomiting 08/06/2018  . Bronchogenic cancer of right lung ([place]) 07/19/2018  . Goals of care,  counseling/discussion 07/19/2018  . Other constipation 07/19/2018  . Brain metastases ([place]) 07/13/2018  . Lung mass 06/26/2018  . Memory changes 08/08/2017  . Localization-related idiopathic epilepsy and epileptic syndromes with seizures of localized onset, not intractable, without status epilepticus ([place]) 11/01/2015  . Edema 09/05/2015  . COPD GOLD 0/ still smoking 08/27/2015  . Postoperative anemia due to acute blood loss 08/27/2015  . Vitamin D deficiency 08/27/2015  . Malnutrition of moderate degree 08/20/2015  . Convulsions ([place])   . Syncope and collapse 08/15/2015  . Anemia of chronic disease 08/15/2015  . Hypokalemia 08/15/2015  . Dehydration 08/15/2015  . Diarrhea 08/15/2015  . QT prolongation 08/15/2015  . Alcohol abuse 08/15/2015  . Anxiety 08/15/2015  . Syncope 08/15/2015  . Closed left hip fracture ([place])   . Seizure [facility])     Past Surgical History:  Procedure Laterality Date  . back fracture surgery  Lumbar 2006   screws and , back brace prn  . BACK SURGERY    . COLONOSCOPY WITH PROPOFOL N/A 07/01/2013   Procedure: COLONOSCOPY WITH PROPOFOL;  Surgeon: Garlan Fair, MD;  Location: [facility] ENDOSCOPY;  Service: Endoscopy;  Laterality: N/A;  . ESOPHAGOGASTRODUODENOSCOPY (EGD) WITH PROPOFOL N/A 07/01/2013   Procedure: ESOPHAGOGASTRODUODENOSCOPY (EGD) WITH PROPOFOL;  Surgeon: Garlan Fair, MD;  Location: [facility] ENDOSCOPY;  Service:  Endoscopy;  Laterality: N/A;  . INTRAMEDULLARY (IM) NAIL INTERTROCHANTERIC Left 08/16/2015   Procedure: INTRAMEDULLARY (IM) NAIL INTERTROCHANTRIC;  Surgeon: Mcarthur Rossetti, MD;  Location: [facility] ORS;  Service: Orthopedics;  Laterality: Left;  . seed implant for prostate cancer    . TONSILLECTOMY    . VIDEO BRONCHOSCOPY Bilateral 07/02/2018   Procedure: VIDEO BRONCHOSCOPY WITH FLUORO;  Surgeon: Tanda Rockers, MD;  Location: [facility] ENDOSCOPY;  Service: Cardiopulmonary;  Laterality: Bilateral;        Home Medications    Prior to Admission  medications   Medication Sig Start Date End Date Taking? Authorizing Provider  amLODipine (NORVASC) 5 MG tablet Take 5 mg by mouth daily. 01/18/18  Yes [provider]  benzonatate (TESSALON) 200 MG capsule Take 1 capsule by mouth 3 (three) times daily as needed for cough.  06/19/18  Yes [provider]  cholecalciferol (VITAMIN D) 1000 UNITS tablet Take 1,000 Units by mouth daily.   Yes [provider]  [employer] B-1 100 MG tablet Take 100 mg by mouth daily. 07/17/15  Yes [provider]  dexamethasone (DECADRON) 4 MG tablet Take 1 tablet (4 mg total) by mouth 4 (four) times daily. Taper per instructions Patient taking differently: Take 2 mg by mouth every other day. Taper per instructions 07/31/18  Yes Hayden Pedro, PA-C  latanoprost (XALATAN) 0.005 % ophthalmic solution Place 1 drop into both eyes at bedtime.  10/20/15  Yes [provider]  levETIRAcetam (KEPPRA) 750 MG tablet Take 1 tablet (750 mg total) by mouth 2 (two) times daily. 08/20/18  Yes Gorsuch, Ni, MD  Magnesium 250 MG TABS Take 250 mg by mouth daily.    Yes [provider]  Multiple Vitamins-Minerals (CENTRUM SILVER ADULT 50+) TABS Take 1 tablet by mouth daily.   Yes [provider]  Rivaroxaban 15 & 20 MG TBPK Take one 15mg  tablet by mouth twice a day with food. On Day 22, switch to one 20mg  tablet once a day with food. 09/02/18  Yes Hayden Pedro, PA-C  vitamin B-12 (CYANOCOBALAMIN) 1000 MCG tablet Take 1,000 mcg by mouth daily.   Yes [provider]  lidocaine-prilocaine (EMLA) cream Apply 1 application topically as needed. 09/03/18   [name], MD  nicotine polacrilex ([employer]S NICOTINE POLACRILEX) 2 MG gum Take 1 each (2 mg total) by mouth as needed for smoking cessation. Patient not taking: Reported on 09/03/2018 08/06/18   [name], MD  ondansetron (ZOFRAN) 8 MG tablet Take 1 tablet (8 mg total) by mouth every 8 (eight) hours as needed for  nausea. Patient not taking: Reported on 09/02/2018 08/06/18   [name], MD  prochlorperazine (COMPAZINE) 10 MG tablet Take 1 tablet (10 mg total) by mouth every 6 (six) hours as needed for nausea or vomiting. Patient not taking: Reported on 09/02/2018 08/06/18   [name], MD    Family History Family History  Problem Relation Age of Onset  . Heart Problems Mother   . Colon cancer Father     Social History Social History   Tobacco Use  . Smoking status: Current Every Day Smoker    Packs/day: 0.50    Years: 60.00    Pack years: 30.00    Types: Cigarettes  . Smokeless tobacco: Never Used  Substance Use Topics  . Alcohol use: No    Alcohol/week: 14.0 standard drinks    Types: 14 Shots of liquor per week    Comment: Former--pt states none since Oct  . Drug  use: No     Allergies   Ace inhibitors and Ramipril   Review of Systems Review of Systems 5 caveat cannot obtain review of systems due to patient condition.  Physical Exam Updated Vital Signs BP (!) 161/106   Pulse (!) 112   Temp 100.1 F (37.8 C) (Rectal)   Resp (!) 22   SpO2 99%   Physical Exam  Constitutional:  Patient is very debilitated and ill-appearing.  He is lying in the stretcher with eyes closed.  Family members give history.  He does not appear to have respiratory distress at rest.  HENT:  Head: Normocephalic and atraumatic.  Mucous  membranes have thick, slimey saliva on them.  Eyes: Pupils are equal, round, and reactive to light. EOM are normal.  Neck: Neck supple.  Cardiovascular:   Tachycardia.  2\6 systolic ejection murmur.  Pulmonary/Chest:  Breath sounds are slightly coarse at the bases.  Adequate airflow.  Abdominal: Soft. He exhibits no distension. There is no tenderness. There is no guarding.  Musculoskeletal:  Musculature is very atrophic.  No significant wounds or ulcers to the extremities.  Neurological:  Patient is very somnolent.  He mumbles some answers but is difficult to  understand.  He is not following any commands.  Skin:  Skin is warm and dry.     ED Treatments / Results  Labs (all labs ordered are listed, but only abnormal results are displayed) Labs Reviewed  COMPREHENSIVE METABOLIC PANEL - Abnormal; Notable for the following components:      Result Value   Sodium 134 (*)    Potassium 5.8 (*)    Glucose, Bld 114 (*)    BUN 31 (*)    Creatinine, Ser 1.39 (*)    Calcium 8.8 (*)    Total Protein 6.2 (*)    Albumin 3.1 (*)    AST 50 (*)    Total Bilirubin 1.7 (*)    GFR calc non Af Amer 46 (*)    GFR calc Af Amer 53 (*)    All other components within normal limits  CBC WITH DIFFERENTIAL/PLATELET - Abnormal; Notable for the following components:   RBC 3.99 (*)    Hemoglobin 12.8 (*)    HCT 38.5 (*)    RDW 17.2 (*)    Lymphs Abs 0.5 (*)    Abs Immature Granulocytes 0.15 (*)    All other components within normal limits  CULTURE, BLOOD (ROUTINE X 2)  CULTURE, BLOOD (ROUTINE X 2)  URINE CULTURE  LIPASE, BLOOD  URINALYSIS, ROUTINE W REFLEX MICROSCOPIC  INFLUENZA PANEL BY PCR (TYPE A & B)  I-STAT CG4 LACTIC ACID, ED  I-STAT TROPONIN, ED  I-STAT CG4 LACTIC ACID, ED    EKG EKG Interpretation  Date/Time:  Sunday September 08 2018 18:44:39 EDT Ventricular Rate:  93 PR Interval:    QRS Duration: 84 QT Interval:  348 QTC Calculation: 433 R Axis:   -43 Text Interpretation:  Sinus rhythm Probable left atrial enlargement Left anterior fascicular block normal, no change from old Confirmed by Charlesetta Shanks [phone]) on 09/08/2018 9:48:36 PM   Radiology Ct Head Wo Contrast  Result Date: 09/08/2018 CLINICAL DATA:  History of lung cancer post radiation two weeks ago. Weakness past 5 days with fever. Known brain metastases. EXAM: CT HEAD WITHOUT CONTRAST TECHNIQUE: Contiguous axial images were obtained from the base of the skull through the vertex without intravenous contrast. COMPARISON:  09/03/2018 and 07/31/2018 FINDINGS: Brain: Examination  demonstrates evidence patient's known right frontal brain  metastasis measuring approximately 2.4 cm in diameter with moderate surrounding edema and mass effect without significant change. There is subtle midline shift to the left of 5 mm without significant change. Chronic ischemic microvascular disease is present. No new findings. Vascular: No hyperdense vessel or unexpected calcification. Skull: Normal. Negative for fracture or focal lesion. Sinuses/Orbits: Orbits are within normal. Minimal mucosal membrane thickening over the right maxillary sinus. Other: None. IMPRESSION: Known 2.4 cm right frontal metastasis with surrounding edema and mild mass effect with midline shift to the left of 5 mm as these findings are unchanged. Chronic ischemic microvascular disease. Minimal chronic sinus inflammatory change. Electronically Signed   By: Marin Olp M.D.   On: 09/08/2018 21:39   Dg Chest Port 1 View  Result Date: 09/08/2018 CLINICAL DATA:  History of lung cancer with last radiation treatment 2 weeks ago. Weakness over the past 5 days with new onset fever. Possible aspiration. EXAM: PORTABLE CHEST 1 VIEW COMPARISON:  07/16/2018 as well as PET-CT 07/12/2018 FINDINGS: Lungs are adequately inflated demonstrate mild interval decrease in size of known 7 cm right upper lobe lung cancer. Remainder of the lungs are clear. Cardiomediastinal silhouette and remainder of the exam is unchanged. IMPRESSION: No acute findings. Slight interval decrease in size of known 7 cm right upper lobe lung cancer. Electronically Signed   By: Marin Olp M.D.   On: 09/08/2018 20:01    Procedures Procedures (including critical care time)  Medications Ordered in ED Medications  ceFEPIme (MAXIPIME) 2 g in sodium chloride 0.9 % 100 mL IVPB (has no administration in time range)  vancomycin (VANCOCIN) IVPB 1000 mg/200 mL premix (has no administration in time range)  sodium chloride 0.9 % bolus 500 mL (0 mLs Intravenous Stopped  09/08/18 2157)  0.9 %  sodium chloride infusion ( Intravenous New Bag/Given 09/08/18 2201)     Initial Impression / Assessment and Plan / ED Course  I have reviewed the triage vital signs and the nursing notes.  Pertinent labs & imaging results that were available during my care of the patient were reviewed by me and considered in my medical decision making (see chart for details).  Clinical Course as of Sep 08 2205  Sun Sep 08, 2018  2152 Consult to oncology ordered   [MP]  2159 Consult: Reviewed with Dr. Alen Blew.  Suggest empiric antibiotic broad-spectrum coverage with cultures obtained.  Admit for monitoring until culture results.  May need hospice placement.  Would not recommend LP at this time.  Consult Dr. Simeon Craft such in the morning.   [MP]    Clinical Course User Index [MP] Charlesetta Shanks, MD   Sepsis evaluation initiated.  Patient however does not have elevated lactic acid no hypotension max temperature measured at this time is 100.1.  I have reviewed the findings with Dr. Alen Blew.  He suggests empiric antibiotic coverage.  30 cc/kg fluid resuscitation not initiated due to no hypotension, no lactic acidosis, no fever and no focal source of infection.  Appears patient is having a progressive decline with increasing disability.  Plan for admission with recommendation for consultation from oncology for the a.m.  Final Clinical Impressions(s) / ED Diagnoses   Final diagnoses:  Generalized weakness  Altered mental status, unspecified altered mental status type  Primary malignant neoplasm of lung metastatic to other site, unspecified laterality ([place])  Fever, unspecified fever cause    ED Discharge Orders    None       Charlesetta Shanks, MD 09/08/18 2208

## 2018-09-08 NOTE — ED Triage Notes (Signed)
Pt BIB EMS from home with wife.  Pt with hx of lung cancer, last radiation tx was 2 weeks ago.  Pt recently discharged from hospital, complaints of weakness for last 5 days.  Wife reports new onset fever of 101.0 today, did not give PO meds due to concern for aspiration. Wife reports pt has difficulty swallowing.

## 2018-09-08 NOTE — ED Notes (Signed)
Patient transported to CT 

## 2018-09-08 NOTE — H&P (Signed)
History and Physical    Wyatt Stein HDQ:[id number] DOB: 1934/02/10 DOA: 09/08/2018  PCP: Wenda Low, MD  Patient coming from: Home.  Chief Complaint: Increasing confusion and fever.  History obtained from patient's wife.  Patient is encephalopathic.  HPI: DRAGAN TAMBURRINO is a 84 y.o. 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  At that time patient also was noticed to have some seizure as per the patient's wife.  Patient was discharged home and since then as per the patient wife has been gradually progressively getting more weak less ambulatory and getting lethargic.  Since patient became more febrile patient was brought to the ER.  Per patient's wife patient did take his medications this morning and did try to eat something.  Has not had any diarrhea.  ED Course: In the ER patient is found to be lethargic.  CT head was showing normal metastasis with edema and midline shift nothing new.  Chest x-ray and UA were unremarkable.  She had a temperature of 100.1 in the ER nurse tachycardic.  And on my exam patient is minimally responsive at times responding to his name but episodic.  Pupils are reacting to light.  Abdomen appears soft.  Chest has some wheezes.  Review of Systems: As per HPI, rest all negative.   Past Medical History:  Diagnosis Date  . Cancer [facility]) 2008   prostate, sees dr [name] once a year  . COPD (chronic obstructive pulmonary disease) ([place])   . GERD (gastroesophageal reflux disease)   . History of blood transfusion 2006  . Hypertension   . PONV (postoperative nausea and vomiting) 40 yrs ago   aspiration pneumonia after tooth extraxtion    Past Surgical History:  Procedure Laterality Date  . back fracture surgery  Lumbar 2006   screws and , back brace  prn  . BACK SURGERY    . COLONOSCOPY WITH PROPOFOL N/A 07/01/2013   Procedure: COLONOSCOPY WITH PROPOFOL;  Surgeon: Garlan Fair, MD;  Location: [facility];  Service: Endoscopy;  Laterality: N/A;  . ESOPHAGOGASTRODUODENOSCOPY (EGD) WITH PROPOFOL N/A 07/01/2013   Procedure: ESOPHAGOGASTRODUODENOSCOPY (EGD) WITH PROPOFOL;  Surgeon: Garlan Fair, MD;  Location: [facility];  Service: Endoscopy;  Laterality: N/A;  . INTRAMEDULLARY (IM) NAIL INTERTROCHANTERIC Left 08/16/2015   Procedure: INTRAMEDULLARY (IM) NAIL INTERTROCHANTRIC;  Surgeon: Mcarthur Rossetti, MD;  Location: [facility];  Service: Orthopedics;  Laterality: Left;  . seed implant for prostate cancer    . TONSILLECTOMY    . VIDEO BRONCHOSCOPY Bilateral 07/02/2018   Procedure: VIDEO BRONCHOSCOPY WITH FLUORO;  Surgeon: Tanda Rockers, MD;  Location: [facility];  Service: Cardiopulmonary;  Laterality: Bilateral;     reports that he has been smoking cigarettes. He has a 30.00 pack-year smoking history. He has never used smokeless tobacco. He reports that he does not drink alcohol or use drugs.  Allergies  Allergen Reactions  . Ace Inhibitors Anaphylaxis and Swelling  . Ramipril Anaphylaxis and Itching    Family History  Problem Relation Age of Onset  . Heart Problems Mother   . Colon cancer Father     Prior to Admission medications   Medication Sig Start Date End Date Taking? Authorizing Provider  amLODipine (NORVASC) 5 MG tablet Take 5 mg by mouth daily. 01/18/18  Yes [provider]  benzonatate (TESSALON) 200 MG capsule Take 1 capsule by mouth 3 (three) times daily as needed for cough.  06/19/18  Yes [provider]  cholecalciferol (VITAMIN D) 1000 UNITS tablet Take 1,000 Units by mouth daily.   Yes [provider]  [employer] B-1 100 MG tablet Take 100 mg by mouth daily. 07/17/15  Yes [provider]  dexamethasone (DECADRON) 4 MG tablet Take 1 tablet (4 mg total) by mouth 4 (four) times  daily. Taper per instructions Patient taking differently: Take 2 mg by mouth every other day. Taper per instructions 07/31/18  Yes Hayden Pedro, PA-C  latanoprost (XALATAN) 0.005 % ophthalmic solution Place 1 drop into both eyes at bedtime.  10/20/15  Yes [provider]  levETIRAcetam (KEPPRA) 750 MG tablet Take 1 tablet (750 mg total) by mouth 2 (two) times daily. 08/20/18  Yes Gorsuch, Ni, MD  Magnesium 250 MG TABS Take 250 mg by mouth daily.    Yes [provider]  Multiple Vitamins-Minerals (CENTRUM SILVER ADULT 50+) TABS Take 1 tablet by mouth daily.   Yes [provider]  Rivaroxaban 15 & 20 MG TBPK Take one 15mg  tablet by mouth twice a day with food. On Day 22, switch to one 20mg  tablet once a day with food. 09/02/18  Yes Hayden Pedro, PA-C  vitamin B-12 (CYANOCOBALAMIN) 1000 MCG tablet Take 1,000 mcg by mouth daily.   Yes [provider]  lidocaine-prilocaine (EMLA) cream Apply 1 application topically as needed. 09/03/18   [name], MD  nicotine polacrilex (CVS NICOTINE POLACRILEX) 2 MG gum Take 1 each (2 mg total) by mouth as needed for smoking cessation. Patient not taking: Reported on 09/03/2018 08/06/18   [name], MD  ondansetron (ZOFRAN) 8 MG tablet Take 1 tablet (8 mg total) by mouth every 8 (eight) hours as needed for nausea. Patient not taking: Reported on 09/02/2018 08/06/18   [name], MD  prochlorperazine (COMPAZINE) 10 MG tablet Take 1 tablet (10 mg total) by mouth every 6 (six) hours as needed for nausea or vomiting. Patient not taking: Reported on 09/02/2018 08/06/18   [name], MD    Physical Exam: Vitals:   09/08/18 2002 09/08/18 2030 09/08/18 2115 09/08/18 2158  BP: (!) 170/107 (!) 170/106  (!) 161/106  Pulse: 100 (!) 107 (!) 104 (!) 112  Resp: 16 (!) 23 (!) 22 (!) 22  Temp:      TempSrc:      SpO2: 99% 97% 100% 99%      Constitutional: Moderately built and nourished. Vitals:   09/08/18 2002  09/08/18 2030 09/08/18 2115 09/08/18 2158  BP: (!) 170/107 (!) 170/106  (!) 161/106  Pulse: 100 (!) 107 (!) 104 (!) 112  Resp: 16 (!) 23 (!) 22 (!) 22  Temp:      TempSrc:      SpO2: 99% 97% 100% 99%   Eyes: Anicteric no pallor. ENMT: No discharge from the ears eyes nose or mouth. Neck: No mass felt.  Mild neck rigidity.  No JVD appreciated. Respiratory: Mild bilateral expiratory wheeze and no crepitations. Cardiovascular: S1-S2 heard no murmurs appreciated. Abdomen: Soft nontender bowel sounds present. Musculoskeletal: Edema both lower extremities. Skin: Chronic skin changes. Neurologic: Patient is lethargic does not follow commands pupils are reacting to light. Psychiatric: Lethargic.   Labs on Admission: I have personally reviewed following labs and imaging studies  CBC: Recent Labs  Lab 09/03/18 1240 09/08/18 1901  WBC 6.6 6.8  NEUTROABS 5.7 5.4  HGB  13.4 12.8*  HCT 39.7 38.5*  MCV 95.7 96.5  PLT PLATELET CLUMPS NOTED ON SMEAR, UNABLE TO ESITMATE 540   Basic Metabolic Panel: Recent Labs  Lab 09/03/18 1240 09/08/18 1901  NA 133* 134*  K 4.0 5.8*  CL 95* 102  CO2 28 22  GLUCOSE 129* 114*  BUN 34* 31*  CREATININE 1.46* 1.39*  CALCIUM 9.1 8.8*   GFR: Estimated Creatinine Clearance: 38 mL/min (A) (by C-G formula based on SCr of 1.39 mg/dL (H)). Liver Function Tests: Recent Labs  Lab 09/03/18 1240 09/08/18 1901  AST 30 50*  ALT 22 21  ALKPHOS 102 83  BILITOT 1.0 1.7*  PROT 7.2 6.2*  ALBUMIN 3.6 3.1*   Recent Labs  Lab 09/03/18 1240 09/08/18 2013  LIPASE 37 31   No results for input(s): AMMONIA in the last 168 hours. Coagulation Profile: No results for input(s): INR, PROTIME in the last 168 hours. Cardiac Enzymes: No results for input(s): CKTOTAL, CKMB, CKMBINDEX, TROPONINI in the last 168 hours. BNP (last 3 results) No results for input(s): PROBNP in the last 8760 hours. HbA1C: No results for input(s): HGBA1C in the last 72 hours. CBG: No  results for input(s): GLUCAP in the last 168 hours. Lipid Profile: No results for input(s): CHOL, HDL, LDLCALC, TRIG, CHOLHDL, LDLDIRECT in the last 72 hours. Thyroid Function Tests: No results for input(s): TSH, T4TOTAL, FREET4, T3FREE, THYROIDAB in the last 72 hours. Anemia Panel: No results for input(s): VITAMINB12, FOLATE, FERRITIN, TIBC, IRON, RETICCTPCT in the last 72 hours. Urine analysis:    Component Value Date/Time   COLORURINE YELLOW 08/15/2015 2102   APPEARANCEUR CLEAR 08/15/2015 2102   LABSPEC 1.009 08/15/2015 2102   PHURINE 6.0 08/15/2015 2102   GLUCOSEU NEGATIVE 08/15/2015 2102   HGBUR NEGATIVE 08/15/2015 2102   BILIRUBINUR NEGATIVE 08/15/2015 2102   [place] NEGATIVE 08/15/2015 2102   PROTEINUR NEGATIVE 08/15/2015 2102   UROBILINOGEN 4.0 (H) 08/15/2015 2102   NITRITE NEGATIVE 08/15/2015 2102   LEUKOCYTESUR TRACE (A) 08/15/2015 2102   Sepsis Labs: @LABRCNTIP (procalcitonin:4,lacticidven:4) )No results found for this or any previous visit (from the past 240 hour(s)).   Radiological Exams on Admission: Ct Head Wo Contrast  Result Date: 09/08/2018 CLINICAL DATA:  History of lung cancer post radiation two weeks ago. Weakness past 5 days with fever. Known brain metastases. EXAM: CT HEAD WITHOUT CONTRAST TECHNIQUE: Contiguous axial images were obtained from the base of the skull through the vertex without intravenous contrast. COMPARISON:  09/03/2018 and 07/31/2018 FINDINGS: Brain: Examination demonstrates evidence patient's known right frontal brain metastasis measuring approximately 2.4 cm in diameter with moderate surrounding edema and mass effect without significant change. There is subtle midline shift to the left of 5 mm without significant change. Chronic ischemic microvascular disease is present. No new findings. Vascular: No hyperdense vessel or unexpected calcification. Skull: Normal. Negative for fracture or focal lesion. Sinuses/Orbits: Orbits are within normal.  Minimal mucosal membrane thickening over the right maxillary sinus. Other: None. IMPRESSION: Known 2.4 cm right frontal metastasis with surrounding edema and mild mass effect with midline shift to the left of 5 mm as these findings are unchanged. Chronic ischemic microvascular disease. Minimal chronic sinus inflammatory change. Electronically Signed   By: Marin Olp M.D.   On: 09/08/2018 21:39   Dg Chest Port 1 View  Result Date: 09/08/2018 CLINICAL DATA:  History of lung cancer with last radiation treatment 2 weeks ago. Weakness over the past 5 days with new onset fever. Possible aspiration. EXAM: PORTABLE CHEST 1  VIEW COMPARISON:  07/16/2018 as well as PET-CT 07/12/2018 FINDINGS: Lungs are adequately inflated demonstrate mild interval decrease in size of known 7 cm right upper lobe lung cancer. Remainder of the lungs are clear. Cardiomediastinal silhouette and remainder of the exam is unchanged. IMPRESSION: No acute findings. Slight interval decrease in size of known 7 cm right upper lobe lung cancer. Electronically Signed   By: Marin Olp M.D.   On: 09/08/2018 20:01    EKG: Independently reviewed.  Normal sinus rhythm.  Assessment/Plan Principal Problem:   Acute encephalopathy Active Problems:   Seizure (HCC)   COPD GOLD 0/ still smoking   Bronchogenic cancer of right lung (HCC)   Fever   DVT (deep venous thrombosis) ([place])    1. Acute encephalopathy with fever with history of metastatic brain lesions and history of seizures -I did discuss with on-call neurologist Dr. Malen Gauze who at this time advised patient to be on IV Decadron 4 mg every 6 and give 1 dose of Keppra loading dose along with Keppra 750 every 12 IV.  Get EEG and MRI brain and will need lumbar puncture to rule out carcinomatosis meningitis.  For now patient is empirically on antibiotics.  Follow cultures.  Consult pulmonary critical care.  Check ABG and ammonia levels. 2. History of seizures on Keppra see #1. 3. COPD  continue inhalers and nebulizer current 4. Hypertensive urgency we will keep patient on IV hydralazine for now until patient passes swallow. 5. History of DVT on Xarelto.  Will keep patient on heparin infusion since patient is lethargic and encephalopathic and cannot swallow and also patient may need lumbar puncture. 6. Anemia appears to be chronic follow CBC.   DVT prophylaxis: Heparin. Code Status: Full code. Family Communication: Patient's wife. Disposition Plan: To be determined. Consults called: Pulmonary critical care and discussed with neurologist. Admission status: Observation.   Rise Patience MD [facility] Pager [phone].  If 7PM-7AM, please contact night-coverage [email or web address] Password [facility]  09/08/2018, 10:22 PM

## 2018-09-09 ENCOUNTER — Observation Stay (HOSPITAL_COMMUNITY): Payer: Medicare Other

## 2018-09-09 ENCOUNTER — Other Ambulatory Visit: Payer: Self-pay

## 2018-09-09 ENCOUNTER — Ambulatory Visit: Payer: Medicare Other

## 2018-09-09 ENCOUNTER — Inpatient Hospital Stay (HOSPITAL_COMMUNITY)
Admit: 2018-09-09 | Discharge: 2018-09-09 | Disposition: A | Discharge status: Home / Self Care | Payer: Medicare Other | Attending: Internal Medicine | Admitting: Internal Medicine

## 2018-09-09 ENCOUNTER — Other Ambulatory Visit: Payer: Self-pay | Admitting: Radiation Therapy

## 2018-09-09 DIAGNOSIS — F17210 Nicotine dependence, cigarettes, uncomplicated: Secondary | ICD-10-CM | POA: Diagnosis present

## 2018-09-09 DIAGNOSIS — I82409 Acute embolism and thrombosis of unspecified deep veins of unspecified lower extremity: Secondary | ICD-10-CM | POA: Diagnosis not present

## 2018-09-09 DIAGNOSIS — G40909 Epilepsy, unspecified, not intractable, without status epilepticus: Secondary | ICD-10-CM | POA: Diagnosis present

## 2018-09-09 DIAGNOSIS — I160 Hypertensive urgency: Secondary | ICD-10-CM | POA: Diagnosis present

## 2018-09-09 DIAGNOSIS — C7931 Secondary malignant neoplasm of brain: Secondary | ICD-10-CM | POA: Diagnosis present

## 2018-09-09 DIAGNOSIS — R531 Weakness: Secondary | ICD-10-CM | POA: Diagnosis not present

## 2018-09-09 DIAGNOSIS — N183 Chronic kidney disease, stage 3 (moderate): Secondary | ICD-10-CM | POA: Diagnosis present

## 2018-09-09 DIAGNOSIS — Z515 Encounter for palliative care: Secondary | ICD-10-CM | POA: Diagnosis present

## 2018-09-09 DIAGNOSIS — N189 Chronic kidney disease, unspecified: Secondary | ICD-10-CM | POA: Diagnosis not present

## 2018-09-09 DIAGNOSIS — R569 Unspecified convulsions: Secondary | ICD-10-CM | POA: Diagnosis not present

## 2018-09-09 DIAGNOSIS — Z6820 Body mass index (BMI) 20.0-20.9, adult: Secondary | ICD-10-CM | POA: Diagnosis not present

## 2018-09-09 DIAGNOSIS — J449 Chronic obstructive pulmonary disease, unspecified: Secondary | ICD-10-CM | POA: Diagnosis present

## 2018-09-09 DIAGNOSIS — E875 Hyperkalemia: Secondary | ICD-10-CM | POA: Diagnosis present

## 2018-09-09 DIAGNOSIS — G9341 Metabolic encephalopathy: Secondary | ICD-10-CM | POA: Diagnosis present

## 2018-09-09 DIAGNOSIS — Z66 Do not resuscitate: Secondary | ICD-10-CM | POA: Diagnosis present

## 2018-09-09 DIAGNOSIS — Z923 Personal history of irradiation: Secondary | ICD-10-CM | POA: Diagnosis not present

## 2018-09-09 DIAGNOSIS — I129 Hypertensive chronic kidney disease with stage 1 through stage 4 chronic kidney disease, or unspecified chronic kidney disease: Secondary | ICD-10-CM | POA: Diagnosis present

## 2018-09-09 DIAGNOSIS — Z7901 Long term (current) use of anticoagulants: Secondary | ICD-10-CM | POA: Diagnosis not present

## 2018-09-09 DIAGNOSIS — E440 Moderate protein-calorie malnutrition: Secondary | ICD-10-CM | POA: Diagnosis present

## 2018-09-09 DIAGNOSIS — R627 Adult failure to thrive: Secondary | ICD-10-CM | POA: Diagnosis present

## 2018-09-09 DIAGNOSIS — D630 Anemia in neoplastic disease: Secondary | ICD-10-CM | POA: Diagnosis present

## 2018-09-09 DIAGNOSIS — Z7189 Other specified counseling: Secondary | ICD-10-CM | POA: Diagnosis not present

## 2018-09-09 DIAGNOSIS — Z79899 Other long term (current) drug therapy: Secondary | ICD-10-CM | POA: Diagnosis not present

## 2018-09-09 DIAGNOSIS — R7881 Bacteremia: Secondary | ICD-10-CM | POA: Diagnosis not present

## 2018-09-09 DIAGNOSIS — C3490 Malignant neoplasm of unspecified part of unspecified bronchus or lung: Secondary | ICD-10-CM | POA: Diagnosis not present

## 2018-09-09 DIAGNOSIS — Z6821 Body mass index (BMI) 21.0-21.9, adult: Secondary | ICD-10-CM | POA: Diagnosis not present

## 2018-09-09 DIAGNOSIS — G9340 Encephalopathy, unspecified: Secondary | ICD-10-CM | POA: Diagnosis not present

## 2018-09-09 DIAGNOSIS — K219 Gastro-esophageal reflux disease without esophagitis: Secondary | ICD-10-CM | POA: Diagnosis present

## 2018-09-09 DIAGNOSIS — R410 Disorientation, unspecified: Secondary | ICD-10-CM | POA: Diagnosis not present

## 2018-09-09 DIAGNOSIS — Z888 Allergy status to other drugs, medicaments and biological substances status: Secondary | ICD-10-CM | POA: Diagnosis not present

## 2018-09-09 DIAGNOSIS — C3491 Malignant neoplasm of unspecified part of right bronchus or lung: Secondary | ICD-10-CM | POA: Diagnosis present

## 2018-09-09 DIAGNOSIS — I10 Essential (primary) hypertension: Secondary | ICD-10-CM | POA: Diagnosis not present

## 2018-09-09 DIAGNOSIS — Z86718 Personal history of other venous thrombosis and embolism: Secondary | ICD-10-CM | POA: Diagnosis not present

## 2018-09-09 DIAGNOSIS — G936 Cerebral edema: Secondary | ICD-10-CM | POA: Diagnosis present

## 2018-09-09 LAB — BLOOD GAS, ARTERIAL
Acid-base deficit: 0.8 mmol/L (ref 0.0–2.0)
BICARBONATE: 21.7 mmol/L (ref 20.0–28.0)
Drawn by: 308601
FIO2: 21
O2 SAT: 95.7 %
PATIENT TEMPERATURE: 98.6
PO2 ART: 79.6 mmHg — AB (ref 83.0–108.0)
pCO2 arterial: 30.7 mmHg — ABNORMAL LOW (ref 32.0–48.0)
pH, Arterial: 7.463 — ABNORMAL HIGH (ref 7.350–7.450)

## 2018-09-09 LAB — BLOOD CULTURE ID PANEL (REFLEXED)
Acinetobacter baumannii: NOT DETECTED
CANDIDA ALBICANS: NOT DETECTED
CANDIDA GLABRATA: NOT DETECTED
CANDIDA KRUSEI: NOT DETECTED
CANDIDA TROPICALIS: NOT DETECTED
Candida parapsilosis: NOT DETECTED
ENTEROBACTER CLOACAE COMPLEX: NOT DETECTED
ESCHERICHIA COLI: NOT DETECTED
Enterobacteriaceae species: NOT DETECTED
Enterococcus species: NOT DETECTED
Haemophilus influenzae: NOT DETECTED
KLEBSIELLA PNEUMONIAE: NOT DETECTED
Klebsiella oxytoca: NOT DETECTED
Listeria monocytogenes: NOT DETECTED
Methicillin resistance: NOT DETECTED
NEISSERIA MENINGITIDIS: NOT DETECTED
PROTEUS SPECIES: NOT DETECTED
Pseudomonas aeruginosa: NOT DETECTED
STAPHYLOCOCCUS SPECIES: DETECTED — AB
STREPTOCOCCUS AGALACTIAE: NOT DETECTED
STREPTOCOCCUS SPECIES: DETECTED — AB
Serratia marcescens: NOT DETECTED
Staphylococcus aureus (BCID): NOT DETECTED
Streptococcus pneumoniae: NOT DETECTED
Streptococcus pyogenes: NOT DETECTED

## 2018-09-09 LAB — CBC WITH DIFFERENTIAL/PLATELET
ABS IMMATURE GRANULOCYTES: 0.33 10*3/uL — AB (ref 0.00–0.07)
BASOS ABS: 0 10*3/uL (ref 0.0–0.1)
Basophils Relative: 0 %
Eosinophils Absolute: 0 10*3/uL (ref 0.0–0.5)
Eosinophils Relative: 0 %
HEMATOCRIT: 35.5 % — AB (ref 39.0–52.0)
HEMOGLOBIN: 11.6 g/dL — AB (ref 13.0–17.0)
IMMATURE GRANULOCYTES: 4 %
LYMPHS ABS: 0.4 10*3/uL — AB (ref 0.7–4.0)
Lymphocytes Relative: 4 %
MCH: 31.9 pg (ref 26.0–34.0)
MCHC: 32.7 g/dL (ref 30.0–36.0)
MCV: 97.5 fL (ref 80.0–100.0)
Monocytes Absolute: 0.5 10*3/uL (ref 0.1–1.0)
Monocytes Relative: 6 %
NEUTROS ABS: 7.8 10*3/uL — AB (ref 1.7–7.7)
NEUTROS PCT: 86 %
NRBC: 0 % (ref 0.0–0.2)
PLATELETS: 197 10*3/uL (ref 150–400)
RBC: 3.64 MIL/uL — ABNORMAL LOW (ref 4.22–5.81)
RDW: 16.9 % — ABNORMAL HIGH (ref 11.5–15.5)
WBC: 9.1 10*3/uL (ref 4.0–10.5)

## 2018-09-09 LAB — BASIC METABOLIC PANEL
ANION GAP: 10 (ref 5–15)
BUN: 26 mg/dL — ABNORMAL HIGH (ref 8–23)
CHLORIDE: 105 mmol/L (ref 98–111)
CO2: 20 mmol/L — ABNORMAL LOW (ref 22–32)
CREATININE: 1.22 mg/dL (ref 0.61–1.24)
Calcium: 8.3 mg/dL — ABNORMAL LOW (ref 8.9–10.3)
GFR calc non Af Amer: 53 mL/min — ABNORMAL LOW (ref >60)
Glucose, Bld: 132 mg/dL — ABNORMAL HIGH (ref 70–99)
Potassium: 3.9 mmol/L (ref 3.5–5.1)
Sodium: 135 mmol/L (ref 135–145)

## 2018-09-09 LAB — GLUCOSE, CAPILLARY
GLUCOSE-CAPILLARY: 117 mg/dL — AB (ref 70–99)
GLUCOSE-CAPILLARY: 111 mg/dL — AB (ref 70–99)
Glucose-Capillary: 134 mg/dL — ABNORMAL HIGH (ref 70–99)
Glucose-Capillary: 129 mg/dL — ABNORMAL HIGH (ref 70–99)

## 2018-09-09 LAB — HEPATIC FUNCTION PANEL
ALBUMIN: 2.7 g/dL — AB (ref 3.5–5.0)
ALT: 20 U/L (ref 0–44)
AST: 22 U/L (ref 15–41)
Alkaline Phosphatase: 76 U/L (ref 38–126)
BILIRUBIN DIRECT: 0.2 mg/dL (ref 0.0–0.2)
Indirect Bilirubin: 0.7 mg/dL (ref 0.3–0.9)
Total Bilirubin: 0.9 mg/dL (ref 0.3–1.2)
Total Protein: 5.7 g/dL — ABNORMAL LOW (ref 6.5–8.1)

## 2018-09-09 LAB — APTT
APTT: 53 s — AB (ref 24–36)
APTT: 108 s — AB (ref 24–36)

## 2018-09-09 LAB — HEPARIN LEVEL (UNFRACTIONATED): HEPARIN UNFRACTIONATED: 1.18 [IU]/mL — AB (ref 0.30–0.70)

## 2018-09-09 LAB — MRSA PCR SCREENING: MRSA BY PCR: NEGATIVE

## 2018-09-09 LAB — AMMONIA: Ammonia: 23 umol/L (ref 9–35)

## 2018-09-09 MED ORDER — HEPARIN (PORCINE) IN NACL 100-0.45 UNIT/ML-% IJ SOLN
1150.0000 [IU]/h | INTRAMUSCULAR | Status: DC
  Administered 2018-09-09: 1000 [IU]/h via INTRAVENOUS
  Filled 2018-09-09: qty 250

## 2018-09-09 MED ORDER — LEVETIRACETAM IN NACL 1000 MG/100ML IV SOLN
1000.0000 mg | Freq: Once | INTRAVENOUS | Status: AC
  Administered 2018-09-09: 1000 mg via INTRAVENOUS
  Filled 2018-09-09: qty 100

## 2018-09-09 MED ORDER — HYDRALAZINE HCL 20 MG/ML IJ SOLN: 5.0000 mg | INTRAMUSCULAR | Status: DC | PRN

## 2018-09-09 MED ORDER — SODIUM CHLORIDE 0.9 % IV SOLN
2.5000 mg/kg | INTRAVENOUS | Status: DC
  Administered 2018-09-09 – 2018-09-12 (×4): 150 mg via INTRAVENOUS
  Filled 2018-09-09 (×4): qty 3

## 2018-09-09 MED ORDER — HEPARIN (PORCINE) IN NACL 100-0.45 UNIT/ML-% IJ SOLN
1100.0000 [IU]/h | INTRAMUSCULAR | Status: DC
  Administered 2018-09-09 (×2): 1150 [IU]/h via INTRAVENOUS
  Filled 2018-09-09: qty 250

## 2018-09-09 MED ORDER — SODIUM CHLORIDE 0.9 % IV SOLN
INTRAVENOUS | Status: DC | PRN
  Administered 2018-09-09: 1000 mL via INTRAVENOUS

## 2018-09-09 MED ORDER — METOPROLOL TARTRATE 5 MG/5ML IV SOLN
5.0000 mg | INTRAVENOUS | Status: AC | PRN
  Administered 2018-09-09 (×2): 5 mg via INTRAVENOUS
  Filled 2018-09-09 (×2): qty 5

## 2018-09-09 MED ORDER — ORAL CARE MOUTH RINSE
15.0000 mL | Freq: Two times a day (BID) | OROMUCOSAL | Status: DC
  Administered 2018-09-09 – 2018-09-12 (×6): 15 mL via OROMUCOSAL

## 2018-09-09 MED ORDER — GADOBUTROL 1 MMOL/ML IV SOLN
6.0000 mL | Freq: Once | INTRAVENOUS | Status: AC | PRN
  Administered 2018-09-09: 6 mL via INTRAVENOUS

## 2018-09-09 MED ORDER — HYDRALAZINE HCL 20 MG/ML IJ SOLN
10.0000 mg | INTRAMUSCULAR | Status: DC | PRN
  Administered 2018-09-10: 10 mg via INTRAVENOUS
  Filled 2018-09-09: qty 1

## 2018-09-09 MED ORDER — HYDRALAZINE HCL 20 MG/ML IJ SOLN
10.0000 mg | Freq: Once | INTRAMUSCULAR | Status: AC
  Administered 2018-09-09: 10 mg via INTRAVENOUS
  Filled 2018-09-09: qty 1

## 2018-09-09 MED ORDER — SODIUM CHLORIDE 0.9 % IV SOLN: 2.0000 g | INTRAVENOUS | Status: DC

## 2018-09-09 MED ORDER — SODIUM CHLORIDE 0.9 % IV SOLN
2.0000 g | Freq: Four times a day (QID) | INTRAVENOUS | Status: DC
  Administered 2018-09-09 – 2018-09-13 (×17): 2 g via INTRAVENOUS
  Filled 2018-09-09: qty 2000
  Filled 2018-09-09 (×7): qty 2
  Filled 2018-09-09: qty 2000
  Filled 2018-09-09 (×2): qty 2
  Filled 2018-09-09: qty 2000
  Filled 2018-09-09 (×7): qty 2
  Filled 2018-09-09: qty 2000

## 2018-09-09 MED ORDER — DEXAMETHASONE SODIUM PHOSPHATE 4 MG/ML IJ SOLN
4.0000 mg | Freq: Four times a day (QID) | INTRAMUSCULAR | Status: DC
  Administered 2018-09-09 – 2018-09-13 (×19): 4 mg via INTRAVENOUS
  Filled 2018-09-09 (×22): qty 1

## 2018-09-09 MED ORDER — SODIUM CHLORIDE 0.9 % IV SOLN
1.0000 g | Freq: Two times a day (BID) | INTRAVENOUS | Status: DC
  Filled 2018-09-09: qty 1

## 2018-09-09 MED ORDER — SODIUM CHLORIDE 0.9 % IV SOLN
750.0000 mg | Freq: Two times a day (BID) | INTRAVENOUS | Status: DC
  Administered 2018-09-09 – 2018-09-13 (×9): 750 mg via INTRAVENOUS
  Filled 2018-09-09 (×2): qty 7.5
  Filled 2018-09-09: qty 5
  Filled 2018-09-09 (×8): qty 7.5

## 2018-09-09 MED ORDER — VANCOMYCIN HCL IN DEXTROSE 1-5 GM/200ML-% IV SOLN: 1000.0000 mg | INTRAVENOUS | Status: DC

## 2018-09-09 MED ORDER — VANCOMYCIN HCL 10 G IV SOLR
1250.0000 mg | INTRAVENOUS | Status: DC
  Administered 2018-09-09 – 2018-09-11 (×2): 1250 mg via INTRAVENOUS
  Filled 2018-09-09 (×2): qty 1250

## 2018-09-09 MED ORDER — SODIUM CHLORIDE 0.9 % IV SOLN
2.0000 g | Freq: Two times a day (BID) | INTRAVENOUS | Status: DC
  Administered 2018-09-09 – 2018-09-12 (×7): 2 g via INTRAVENOUS
  Filled 2018-09-09 (×7): qty 2

## 2018-09-09 NOTE — Progress Notes (Signed)
ANTICOAGULATION CONSULT NOTE - Initial Consult  Pharmacy Consult for Heparin (bridge rivaroxaban) Indication: DVT  Allergies  Allergen Reactions  . Ace Inhibitors Anaphylaxis and Swelling  . Ramipril Anaphylaxis and Itching    Patient Measurements: Height: 5\' 7"  (170.2 cm) Weight: 142 lb (64.4 kg) IBW/kg (Calculated) : 66.1 Heparin Dosing Weight:   Vital Signs: Temp: 99.2 F (37.3 C) (10/27 2257) Temp Source: Oral (10/27 2257) BP: 155/102 (10/27 2330) Pulse Rate: 112 (10/28 0015)  Labs: Recent Labs    09/08/18 1901  HGB 12.8*  HCT 38.5*  PLT 227  CREATININE 1.39*    Estimated Creatinine Clearance: 37.3 mL/min (A) (by C-G formula based on SCr of 1.39 mg/dL (H)).   Medical History: Past Medical History:  Diagnosis Date  . Cancer [facility]) 2008   prostate, sees dr [name] once a year  . COPD (chronic obstructive pulmonary disease) ([place])   . GERD (gastroesophageal reflux disease)   . History of blood transfusion 2006  . Hypertension   . PONV (postoperative nausea and vomiting) 40 yrs ago   aspiration pneumonia after tooth extraxtion    Medications:   (Not in a hospital admission) Infusions:  . levETIRAcetam      Assessment: Patient on rivaroxaban for hx of DVT, patient taking per med rec 15mg  po bid with last dose 10/27 at 0900---  PTT ordered with Heparin level until both correlate due to possible drug-lab interaction between oral anticoagulant (rivaroxaban, edoxaban, or apixaban) and anti-Xa level (aka heparin level)   Goal of Therapy:  Heparin level 0.3-0.7 units/ml aPTT 66-102 seconds Monitor platelets by anticoagulation protocol: Yes   Plan:  Start heparin without bolus Heparin at 1000 units/hr infusion Heparin level and PTT at 1000 CBC daily  Tyler Deis, Shea Stakes [place] 09/09/2018,12:44 AM

## 2018-09-09 NOTE — Progress Notes (Signed)
Pt opened his eyes for his wife and tracked, but did not talk.  Squeezed hand but did not release.  pts bp remains high in spite of lopressor doses.  [place] NP notified.  Orders received

## 2018-09-09 NOTE — Procedures (Signed)
ELECTROENCEPHALOGRAM REPORT   Patient: Wyatt Stein       Room #: 5809 EEG No. ID: [id number] Age: 84 y.o.        Sex: male Referring Physician: Verlon Au Report Date:  09/09/2018        Interpreting Physician: Alexis Goodell  History: [name] is an 84 y.o. male with metastatic lung cancer and questionable seizure activity  Medications:  Omnipen, Rocephin, Decadron, Cytovene, Keppra, Vancomycin, Heparin  Conditions of Recording:  This is a 21 channel routine scalp EEG performed with bipolar and monopolar montages arranged in accordance to the international 10/20 system of electrode placement. One channel was dedicated to EKG recording.  The patient is in the lethargic state.  Description:  The waking background activity is slow and poorly organized.  This slow activity is most pronounced intermittently over the right hemisphere, most notably the right temporal region where a frequent intermittent polymorphic delta rhythm is noted.  The left hemisphere is also slow but consists of a mixture of poorly organized delta and theta activity.  There are also frequent periods where a rhythmical beta activity is noted over the left hemisphere, particularly in the fronto-central region.    Along with the slowing there are noted intermittent periodic discharges of triphasic morphology.  These are seen over both hemispheres.    Hyperventilation and intermittent photic stimulation were not performed.   IMPRESSION: This is an abnormal electroencephalogram secondary to background slowing and disorganization that is most prominent over the left temporal region.  Also noted are periodic discharges of triphasic morphology over both hemispheres.  Clinical correlation recommended.      Alexis Goodell, MD Neurology [phone] 09/09/2018, 6:42 PM

## 2018-09-09 NOTE — Progress Notes (Signed)
ANTICOAGULATION CONSULT NOTE - Follow Up Consult  Pharmacy Consult for Heparin  Indication: DVT, rivaroxaban PTA  Allergies  Allergen Reactions  . Ace Inhibitors Anaphylaxis and Swelling  . Ramipril Anaphylaxis and Itching    Patient Measurements: Height: 5\' 7"  (170.2 cm) Weight: 134 lb 0.6 oz (60.8 kg) IBW/kg (Calculated) : 66.1 Heparin Dosing Weight:   Vital Signs: Temp: 98.1 F (36.7 C) (10/28 1200) Temp Source: Oral (10/28 1200) BP: 153/107 (10/28 1100)  Labs: Recent Labs    09/08/18 1901 09/09/18 0340 09/09/18 1008  HGB 12.8* 11.6*  --   HCT 38.5* 35.5*  --   PLT 227 197  --   APTT  --   --  53*  HEPARINUNFRC  --   --  1.18*  CREATININE 1.39* 1.22  --     Estimated Creatinine Clearance: 40.1 mL/min (by C-G formula based on SCr of 1.22 mg/dL).   Medications:  rivaroxaban 15 mg bid PTA last dose 10/27 @ 0900  Assessment: 84 y/o M with a h/o stage IV lung cancer with brain metastases and DVT previously on rivaroxaban transitioned to heparin infusion due to encephalopathy and possible need for lumbar puncture.   09/09/18 - HL remains elevated as expected with last dose 10/27 - aPTT below goal  Goal of Therapy:  aPTT 66-102s  Heparin level 0.3-0.7 units/ml Monitor platelets by anticoagulation protocol: Yes   Plan:  Will increase heparin infusion to 1150 units/hr.  - aPTT in 6 hous - daily HL until levels correlate - daily CBC  Ulice Dash D 09/09/2018,12:58 PM

## 2018-09-09 NOTE — Progress Notes (Signed)
MDT

## 2018-09-09 NOTE — Progress Notes (Signed)
Pt more lethargic than when he arrived in SDU at 0100.   ABG, ammonia level ordered.  Will monitor closely

## 2018-09-09 NOTE — Care Management Note (Signed)
Case Management Note  Patient Details  Name: Wyatt Stein MRN: [id number] Date of Birth: [birth year]  Subjective/Objective:                  Hx of lung and brain ca, increased weakness and ams, temp-100,o2 [place] at 21%,iv decaron, iv keppra,iv ampicillin, iv rocephin, iv cytovene,  Action/Plan: Will follow for progression of care and clinical status. Will follow for case management needs none present at this time.  Expected Discharge Date:  09/12/18               Expected Discharge Plan:  Home/Self Care  In-House Referral:     Discharge planning Services  CM Consult  Post Acute Care Choice:    Choice offered to:     DME Arranged:    DME Agency:     HH Arranged:    HH Agency:     Status of Service:  In process, will continue to follow  If discussed at Long Length of Stay Meetings, dates discussed:    Additional Comments:  Leeroy Cha, RN 09/09/2018, 8:41 AM

## 2018-09-09 NOTE — Progress Notes (Addendum)
Pharmacy Antibiotic Note  Wyatt Stein is a 84 y.o. male with a h/o stage IV lung cancer with brain metastases admitted on 09/08/2018 with encephalopathy.  Pharmacy has been consulted for vancomycin and ganciclovir dosing to be used along with ceftriaxone and ampicillin empirically for r/o meningitis.   Plan: Ganciclovir 2.5 mg/kg iv q24h.   Will increase vancomycin dosing to 1250 mg iv q36h for a predicted AUC of 520 using IBW/TBW.   Goal AUC > 500  - F/U SCr, culture results - Levels as indicated  Height: 5\' 7"  (170.2 cm) Weight: 134 lb 0.6 oz (60.8 kg) IBW/kg (Calculated) : 66.1  Temp (24hrs), Avg:99.2 F (37.3 C), Min:98 F (36.7 C), Max:100.1 F (37.8 C)  Recent Labs  Lab 09/03/18 1240 09/08/18 1901 09/08/18 1902 09/09/18 0340  WBC 6.6 6.8  --  9.1  CREATININE 1.46* 1.39*  --  1.22  LATICACIDVEN  --   --  1.08  --     Estimated Creatinine Clearance: 40.1 mL/min (by C-G formula based on SCr of 1.22 mg/dL).    Allergies  Allergen Reactions  . Ace Inhibitors Anaphylaxis and Swelling  . Ramipril Anaphylaxis and Itching    Antimicrobials this admission: Cefepime 09/08/2018  Vancomycin 09/08/2018 >> Ceftriaxone 10/28 >> Ampicillin 10/28 >> Ganciclovir 10/28 >>  Dose adjustments this admission: 10/28 vancomycin 1000 mg iv q36h to 1250 mg q36h  Microbiology results: 10/27 UCx:  10/27 BCx: 10/28 MRSA PCR: negative  Thank you for allowing pharmacy to be a part of this patient's care.  Ulice Dash D 09/09/2018 8:57 AM

## 2018-09-09 NOTE — Progress Notes (Signed)
EEG completed, results pending. 

## 2018-09-09 NOTE — Progress Notes (Signed)
[facility] PRO[facility]ESS NOTE  Wyatt Stein GHW:[id number] DOB: 1934/08/23 DOA: 09/08/2018 PCP: Wenda Low, MD   Narrative: 44 m  Bronchogenic CA right lung status post radiation therapy to brain and chest early October, Gold COPD stage II, nausea It is documented that he has had seizures followed and profound weakness -per Dr. Alvy Bimler 10/8 suggest  PET scan was being contemplated and may be combination carboplatin gemcitabine as rx further line of therapy  Presented on 10/22 with one episode of hematemesis and mild confusion but was discharged home-since then minimally able to get out of recliner requiring full assistance and much more confused not making sense noted temperature of 101 poor appetite  Oncology was consulted and recommended broad-spectrum antibiotics although no focal source noted  Neurology Dr. Malen Gauze was consulted and recommended Decadron 4 mg every 6 and Keppra loading dose along with some 50 every 12 recommended MRI brain EEG and eventually need for lumbar puncture      A & Plan Metabolic encephalopathy-Infectious with meningitis vs Midline shift from Ca-he is already improving-has been on Xarelto--Evidence favours more Mass effect > infectious etiology--could also have misse dhis meds for Sz prophylaxis as well [Keppra]--might consider LP in 1-2 days-although after 2 days would be lower yield for infection--could do post-washout xarelto over 72 hr--Dr. Alvy Bimler aware and will see later this admit Cont Decadron 4mg  q6, Keppra 750 bid iv  Bronchogenic CA right lung with metastases to brain-see above note-may require rpt fractions of XRT? Hyperkalemia on admission-resolved now Hypertensive urgency-continue hydralazine and metoprolol  COPD-syable currentyl no wheeze DVT on Xarelto at home-now on IV heparin Chronic anemia   Heparin gtt, full code, ip pending furthe rd/w Keturah Barre, MD  [facility] Direct contact: [phone] --[place] [place]  --[email or web address]; password [email or web address]  7PM-7AM contact night coverage as above 09/09/2018, 7:39 AM  LOS: 0 days   Consultants:  Oncology  ccm  Procedures:  Ct head  Antimicrobials:  Ceftriaxone   Vancomycin  Ampicillin  valacyclovir  Interval history/Subjective:  more awake Coherent  Talking more Nursing reports lethargic earlier this am-now tells me " I am in hopsital" [place] name Very weak  Objective:  Vitals:  Vitals:   09/09/18 0500 09/09/18 0600  BP: (!) 180/103 (!) 184/107  Pulse:    Resp: (!) 21 (!) 25  Temp:    SpO2:      Exam:  Weak -arucs senilis cta b s1 s2 no m abd sof tnt dn  Neuro-power limited by condition but 5/5 In arms nnd thighs Reflexes equivocal Finger nose finger grossly intact  I have personally reviewed the following:   Labs:  K down from 5.8->3.9  CO2 20  Bun /cre from 31/1.39->26/1.2  WBC 91, PLT 197  Imaging studies:  CT head shows some mass effectr  Medical tests:   n   Test discussed with performing physician:  n  Decision to obtain old records:  n  Review and summation of old records:  y  Scheduled Meds: . dexamethasone  4 mg Intravenous Q6H  . latanoprost  1 drop Both Eyes QHS  . mouth rinse  15 mL Mouth Rinse BID   Continuous Infusions: . sodium chloride 1,000 mL (09/09/18 0635)  . cefTRIAXone (ROCEPHIN)  IV    . heparin 1,000 Units/hr (09/09/18 0400)  . levETIRAcetam    . vancomycin      Principal Problem:   Acute encephalopathy Active Problems:   Seizure (HCC)   COPD GOLD  0/ still smoking   Bronchogenic cancer of right lung (HCC)   Fever   DVT (deep venous thrombosis) ([place])   LOS: 0 days

## 2018-09-09 NOTE — Progress Notes (Signed)
ANTICOAGULATION CONSULT NOTE - Follow Up Consult  Pharmacy Consult for Heparin  Indication: DVT, rivaroxaban PTA  Allergies  Allergen Reactions  . Ace Inhibitors Anaphylaxis and Swelling  . Ramipril Anaphylaxis and Itching    Patient Measurements: Height: 5\' 7"  (170.2 cm) Weight: 134 lb 0.6 oz (60.8 kg) IBW/kg (Calculated) : 66.1 Heparin Dosing Weight:   Vital Signs: Temp: 98.2 F (36.8 C) (10/28 1956) Temp Source: Oral (10/28 1956) BP: 149/103 (10/28 1800) Pulse Rate: 96 (10/28 1300)  Labs: Recent Labs    09/08/18 1901 09/09/18 0340 09/09/18 1008 09/09/18 1936  HGB 12.8* 11.6*  --   --   HCT 38.5* 35.5*  --   --   PLT 227 197  --   --   APTT  --   --  53* 108*  HEPARINUNFRC  --   --  1.18*  --   CREATININE 1.39* 1.22  --   --     Estimated Creatinine Clearance: 40.1 mL/min (by C-G formula based on SCr of 1.22 mg/dL).   Medications:  rivaroxaban 15 mg bid PTA last dose 10/27 @ 0900  Assessment: 84 y/o M with a h/o stage IV lung cancer with brain metastases and DVT previously on rivaroxaban transitioned to heparin infusion due to encephalopathy and possible need for lumbar puncture.   09/09/18 - aPTT now slightly above goal (108sec) on heparin 1150 units/hr -No bleeding or infusion related issues reported by RN  Goal of Therapy:  aPTT 66-102s  Heparin level 0.3-0.7 units/ml Monitor platelets by anticoagulation protocol: Yes   Plan:  Will decrease heparin infusion to 1100 units/hr.  - aPTT in 8 hours (with morning labs) - daily HL until levels correlate - daily CBC  Areta Terwilliger, Lavonia Drafts 09/09/2018,8:36 PM

## 2018-09-09 NOTE — Progress Notes (Signed)
PHARMACY - PHYSICIAN COMMUNICATION CRITICAL VALUE ALERT - BLOOD CULTURE IDENTIFICATION (BCID)  Wyatt Stein is an 84 y.o. male who presented to [facility] on 09/08/2018 with a chief complaint of increasing confusion & fever.   Assessment:  Patient with a h/o stage IV lung cancer with brain metastases admitted on 09/08/2018 with encephalopathy.   Name of physician (or Provider) Contacted: Samtani Current antibiotics: Rocephin/Ampicillin/Vancomycin/Ganciclovir  Changes to prescribed antibiotics recommended: None  Results for orders placed or performed during the hospital encounter of 09/08/18  Blood Culture ID Panel (Reflexed) (Collected: 09/08/2018  8:12 PM)  Result Value Ref Range   Enterococcus species NOT DETECTED NOT DETECTED   Listeria monocytogenes NOT DETECTED NOT DETECTED   Staphylococcus species DETECTED (A) NOT DETECTED   Staphylococcus aureus (BCID) NOT DETECTED NOT DETECTED   Methicillin resistance NOT DETECTED NOT DETECTED   Streptococcus species DETECTED (A) NOT DETECTED   Streptococcus agalactiae NOT DETECTED NOT DETECTED   Streptococcus pneumoniae NOT DETECTED NOT DETECTED   Streptococcus pyogenes NOT DETECTED NOT DETECTED   Acinetobacter baumannii NOT DETECTED NOT DETECTED   Enterobacteriaceae species NOT DETECTED NOT DETECTED   Enterobacter cloacae complex NOT DETECTED NOT DETECTED   Escherichia coli NOT DETECTED NOT DETECTED   Klebsiella oxytoca NOT DETECTED NOT DETECTED   Klebsiella pneumoniae NOT DETECTED NOT DETECTED   Proteus species NOT DETECTED NOT DETECTED   Serratia marcescens NOT DETECTED NOT DETECTED   Haemophilus influenzae NOT DETECTED NOT DETECTED   Neisseria meningitidis NOT DETECTED NOT DETECTED   Pseudomonas aeruginosa NOT DETECTED NOT DETECTED   Candida albicans NOT DETECTED NOT DETECTED   Candida glabrata NOT DETECTED NOT DETECTED   Candida krusei NOT DETECTED NOT DETECTED   Candida parapsilosis NOT DETECTED NOT DETECTED   Candida  tropicalis NOT DETECTED NOT DETECTED    Biagio Borg 09/09/2018  5:19 PM

## 2018-09-09 NOTE — Progress Notes (Signed)
Pharmacy Antibiotic Note  Wyatt Stein is a 84 y.o. male admitted on 09/08/2018 with sepsis.  Pharmacy has been consulted for Vancomycin, cefepime dosing.  Plan: Cefepime 2gm iv x1, then 1gm iv q12hr  Vancomycin 1gm iv x1, then ~24hr later begin 1gm iv q36hr  Goal AUC = 400 - 500 for all indications, except meningitis (goal AUC > 500 and Cmin 15-20 mcg/mL)   Height: 5\' 7"  (170.2 cm) Weight: 142 lb (64.4 kg) IBW/kg (Calculated) : 66.1  Temp (24hrs), Avg:99.7 F (37.6 C), Min:99.2 F (37.3 C), Max:100.1 F (37.8 C)  Recent Labs  Lab 09/03/18 1240 09/08/18 1901 09/08/18 1902  WBC 6.6 6.8  --   CREATININE 1.46* 1.39*  --   LATICACIDVEN  --   --  1.08    Estimated Creatinine Clearance: 37.3 mL/min (A) (by C-G formula based on SCr of 1.39 mg/dL (H)).    Allergies  Allergen Reactions  . Ace Inhibitors Anaphylaxis and Swelling  . Ramipril Anaphylaxis and Itching    Antimicrobials this admission: Vancomycin 09/08/2018 >> Cefepime 09/08/2018 >>   Dose adjustments this admission: =  Microbiology results: =  Thank you for allowing pharmacy to be a part of this patient's care.  Wyatt Stein 09/09/2018 12:55 AM

## 2018-09-09 NOTE — Progress Notes (Signed)
   09/09/18 1100  Clinical Encounter Type  Visited With Family  Visit Type Initial;Psychological support;Spiritual support;Critical Care  Referral From Nurse  Consult/Referral To Chaplain  Spiritual Encounters  Spiritual Needs Emotional;Other (Comment) (Spiritual Care Conversation/Support)  Stress Factors  Patient Stress Factors Not reviewed  Family Stress Factors Health changes;Major life changes   I spoke with the patient's daughter per spiritual care consult. The patient's daughter stated that they did not have any needs at this time. The patient was out of the room due to a procedure. The patient's daughter stated that they have a local pastor that has provided support.   Please, contact Spiritual Care for further assistance. I will follow-up with the patient once he is back in his room.   Chaplain Shanon Ace M.Div., [facility]

## 2018-09-09 NOTE — ED Notes (Signed)
ED TO INPATIENT HANDOFF REPORT  Name/Age/Gender Wyatt Stein 84 y.o. male  Code Status    Code Status Orders  (From admission, onward)         Start     Ordered   09/08/18 2222  Full code  Continuous     09/08/18 2222        Code Status History    Date Active Date Inactive Code Status Order ID Comments User Context   07/13/2018 1903 07/16/2018 1638 Full Code [id number]  Mendel Corning, MD Inpatient   08/16/2015 2219 08/20/2015 2132 Full Code [id number]  Mcarthur Rossetti, MD Inpatient   08/15/2015 2303 08/16/2015 2219 Full Code [id number]  Toy Baker, MD Inpatient      Home/SNF/Other Home  Chief Complaint Sepsis  Level of Care/Admitting Diagnosis ED Disposition    ED Disposition Condition [place]: [facility] [[id number]]  Level of Care: Stepdown [14]  Admit to SDU based on following criteria: Severe physiological/psychological symptoms:  Any diagnosis requiring assessment & intervention at least every 4 hours on an ongoing basis to obtain desired patient outcomes including stability and rehabilitation  Diagnosis: Acute encephalopathy [462703]  Admitting Physician: Rise Patience [phone]  Attending Physician: Rise Patience [phone]  PT Class (Do Not Modify): Observation [104]  PT Acc Code (Do Not Modify): Observation [10022]       Medical History Past Medical History:  Diagnosis Date  . Cancer [facility]) 2008   prostate, sees dr [name] once a year  . COPD (chronic obstructive pulmonary disease) ([place])   . GERD (gastroesophageal reflux disease)   . History of blood transfusion 2006  . Hypertension   . PONV (postoperative nausea and vomiting) 40 yrs ago   aspiration pneumonia after tooth extraxtion    Allergies Allergies  Allergen Reactions  . Ace Inhibitors Anaphylaxis and Swelling  . Ramipril Anaphylaxis and Itching    IV Location/Drains/Wounds Patient Lines/Drains/Airways Status   Active  Line/Drains/Airways    Name:   Placement date:   Placement time:   Site:   Days:   Peripheral IV 09/08/18 Right Wrist   09/08/18    -    Wrist   1   Peripheral IV 09/08/18 Left Forearm   09/08/18    -    Forearm   1   External Urinary Catheter   09/08/18    2029    -   1          Labs/Imaging Results for orders placed or performed during the hospital encounter of 09/08/18 (from the past 48 hour(s))  I-stat troponin, ED (not at [facility], [facility])     Status: None   Collection Time: 09/08/18  7:00 PM  Result Value Ref Range   Troponin i, poc 0.04 0.00 - 0.08 ng/mL   Comment 3            Comment: Due to the release kinetics of cTnI, a negative result within the first hours of the onset of symptoms does not rule out myocardial infarction with certainty. If myocardial infarction is still suspected, repeat the test at appropriate intervals.   Comprehensive metabolic panel     Status: Abnormal   Collection Time: 09/08/18  7:01 PM  Result Value Ref Range   Sodium 134 (L) 135 - 145 mmol/L   Potassium 5.8 (H) 3.5 - 5.1 mmol/L   Chloride 102 98 - 111 mmol/L   CO2 22 22 - 32 mmol/L  Glucose, Bld 114 (H) 70 - 99 mg/dL   BUN 31 (H) 8 - 23 mg/dL   Creatinine, Ser 1.39 (H) 0.61 - 1.24 mg/dL   Calcium 8.8 (L) 8.9 - 10.3 mg/dL   Total Protein 6.2 (L) 6.5 - 8.1 g/dL   Albumin 3.1 (L) 3.5 - 5.0 g/dL   AST 50 (H) 15 - 41 U/L   ALT 21 0 - 44 U/L   Alkaline Phosphatase 83 38 - 126 U/L   Total Bilirubin 1.7 (H) 0.3 - 1.2 mg/dL   GFR calc non Af Amer 46 (L) >60 mL/min   GFR calc Af Amer 53 (L) >60 mL/min    Comment: (NOTE) The eGFR has been calculated using the CKD EPI equation. This calculation has not been validated in all clinical situations. eGFR's persistently <60 mL/min signify possible Chronic Kidney Disease.    Anion gap 10 5 - 15    Comment: Performed at [facility], [place] [address], [place], [place] [address]  CBC WITH DIFFERENTIAL     Status: Abnormal   Collection  Time: 09/08/18  7:01 PM  Result Value Ref Range   WBC 6.8 4.0 - 10.5 K/uL   RBC 3.99 (L) 4.22 - 5.81 MIL/uL   Hemoglobin 12.8 (L) 13.0 - 17.0 g/dL   HCT 38.5 (L) 39.0 - 52.0 %   MCV 96.5 80.0 - 100.0 fL   MCH 32.1 26.0 - 34.0 pg   MCHC 33.2 30.0 - 36.0 g/dL   RDW 17.2 (H) 11.5 - 15.5 %   Platelets 227 150 - 400 K/uL   nRBC 0.0 0.0 - 0.2 %   Neutrophils Relative % 79 %   Neutro Abs 5.4 1.7 - 7.7 K/uL   Lymphocytes Relative 8 %   Lymphs Abs 0.5 (L) 0.7 - 4.0 K/uL   Monocytes Relative 11 %   Monocytes Absolute 0.8 0.1 - 1.0 K/uL   Eosinophils Relative 0 %   Eosinophils Absolute 0.0 0.0 - 0.5 K/uL   Basophils Relative 0 %   Basophils Absolute 0.0 0.0 - 0.1 K/uL   Immature Granulocytes 2 %   Abs Immature Granulocytes 0.15 (H) 0.00 - 0.07 K/uL    Comment: Performed at [facility], [place] [address], [place], [place] [address]  I-Stat CG4 Lactic Acid, ED  (not at  [facility])     Status: None   Collection Time: 09/08/18  7:02 PM  Result Value Ref Range   Lactic Acid, Venous 1.08 0.5 - 1.9 mmol/L  Lipase, blood     Status: None   Collection Time: 09/08/18  8:13 PM  Result Value Ref Range   Lipase 31 11 - 51 U/L    Comment: Performed at [facility], [place] [address] [place], [place] [address]  Influenza panel by PCR (type A & B)     Status: None   Collection Time: 09/08/18  8:50 PM  Result Value Ref Range   Influenza A By PCR NEGATIVE NEGATIVE   Influenza B By PCR NEGATIVE NEGATIVE    Comment: (NOTE) The Xpert Xpress Flu assay is intended as an aid in the diagnosis of  influenza and should not be used as a sole basis for treatment.  This  assay is FDA approved for nasopharyngeal swab specimens only. Nasal  washings and aspirates are unacceptable for Xpert Xpress Flu testing. Performed at [facility], [place] [address] [place],  [address]   Urinalysis, Routine w reflex microscopic  Status: Abnormal   Collection Time:  09/08/18  9:57 PM  Result Value Ref Range   Color, Urine STRAW (A) YELLOW   APPearance CLEAR CLEAR   Specific Gravity, Urine 1.013 1.005 - 1.030   pH 7.0 5.0 - 8.0   Glucose, UA NEGATIVE NEGATIVE mg/dL   Hgb urine dipstick MODERATE (A) NEGATIVE   Bilirubin Urine NEGATIVE NEGATIVE   Ketones, ur NEGATIVE NEGATIVE mg/dL   Protein, ur 100 (A) NEGATIVE mg/dL   Nitrite NEGATIVE NEGATIVE   Leukocytes, UA NEGATIVE NEGATIVE   RBC / HPF 6-10 0 - 5 RBC/hpf   WBC, UA 0-5 0 - 5 WBC/hpf   Bacteria, UA NONE SEEN NONE SEEN    Comment: Performed at [facility], [place] [address] [place], [place] [address]  CBG monitoring, ED     Status: Abnormal   Collection Time: 09/08/18 11:48 PM  Result Value Ref Range   Glucose-Capillary 124 (H) 70 - 99 mg/dL   Ct Head Wo Contrast  Result Date: 09/08/2018 CLINICAL DATA:  History of lung cancer post radiation two weeks ago. Weakness past 5 days with fever. Known brain metastases. EXAM: CT HEAD WITHOUT CONTRAST TECHNIQUE: Contiguous axial images were obtained from the base of the skull through the vertex without intravenous contrast. COMPARISON:  09/03/2018 and 07/31/2018 FINDINGS: Brain: Examination demonstrates evidence patient's known right frontal brain metastasis measuring approximately 2.4 cm in diameter with moderate surrounding edema and mass effect without significant change. There is subtle midline shift to the left of 5 mm without significant change. Chronic ischemic microvascular disease is present. No new findings. Vascular: No hyperdense vessel or unexpected calcification. Skull: Normal. Negative for fracture or focal lesion. Sinuses/Orbits: Orbits are within normal. Minimal mucosal membrane thickening over the right maxillary sinus. Other: None. IMPRESSION: Known 2.4 cm right frontal metastasis with surrounding edema and mild mass effect with midline shift to the left of 5 mm as these findings are unchanged. Chronic ischemic microvascular  disease. Minimal chronic sinus inflammatory change. Electronically Signed   By: Marin Olp M.D.   On: 09/08/2018 21:39   Dg Chest Port 1 View  Result Date: 09/08/2018 CLINICAL DATA:  History of lung cancer with last radiation treatment 2 weeks ago. Weakness over the past 5 days with new onset fever. Possible aspiration. EXAM: PORTABLE CHEST 1 VIEW COMPARISON:  07/16/2018 as well as PET-CT 07/12/2018 FINDINGS: Lungs are adequately inflated demonstrate mild interval decrease in size of known 7 cm right upper lobe lung cancer. Remainder of the lungs are clear. Cardiomediastinal silhouette and remainder of the exam is unchanged. IMPRESSION: No acute findings. Slight interval decrease in size of known 7 cm right upper lobe lung cancer. Electronically Signed   By: Marin Olp M.D.   On: 09/08/2018 20:01   EKG Interpretation  Date/Time:  'Sunday September 08 2018 18:44:39 EDT Ventricular Rate:  93 PR Interval:    QRS Duration: 84 QT Interval:  348 QTC Calculation: 433 R Axis:   -43 Text Interpretation:  Sinus rhythm Probable left atrial enlargement Left anterior fascicular block normal, no change from old Confirmed by Pfeiffer, Marcy ([id number]) on 09/08/2018 9:48:36 PM   Pending Labs Unresulted Labs (From admission, onward)    Start     Ordered   09/10/18 1230  Protime-INR  STAT,   R     09/08/18 1843   09/09/18 0500  Basic metabolic panel  Tomorrow morning,   R     10'$ /27/19 2222   09/09/18 0500  CBC  WITH DIFFERENTIAL  Tomorrow morning,   R     09/08/18 2222   09/09/18 0500  Hepatic function panel  Tomorrow morning,   R     09/08/18 2222   09/08/18 2222  TSH  Once,   R     09/08/18 2222   09/08/18 1931  Urine culture  STAT,   STAT     09/08/18 1931   09/08/18 1849  Blood Culture (routine x 2)  BLOOD CULTURE X 2,   STAT     09/08/18 1849          Vitals/Pain Today's Vitals   09/08/18 2257 09/08/18 2330 09/08/18 2345 09/09/18 0006  BP: (!) 175/111 (!) 155/102    Pulse: (!) 114 (!)  113 (!) 109   Resp: (!) '22 20 18   '$ Temp: 99.2 F (37.3 C)     TempSrc: Oral     SpO2: 97% 97% 98%   Weight: 142 lb (64.4 kg)     Height: '5\' 7"'$  (1.702 m)     PainSc:    0-No pain    Isolation Precautions No active isolations  Medications Medications  latanoprost (XALATAN) 0.005 % ophthalmic solution 1 drop (has no administration in time range)  acetaminophen (TYLENOL) tablet 650 mg (has no administration in time range)    Or  acetaminophen (TYLENOL) suppository 650 mg (has no administration in time range)  levETIRAcetam (KEPPRA) IVPB 1000 mg/100 mL premix (has no administration in time range)  dexamethasone (DECADRON) injection 4 mg (has no administration in time range)  sodium chloride 0.9 % bolus 500 mL (0 mLs Intravenous Stopped 09/08/18 2157)  0.9 %  sodium chloride infusion ( Intravenous New Bag/Given 09/08/18 2201)  ceFEPIme (MAXIPIME) 2 g in sodium chloride 0.9 % 100 mL IVPB (2 g Intravenous New Bag/Given 09/08/18 2217)  vancomycin (VANCOCIN) IVPB 1000 mg/200 mL premix (1,000 mg Intravenous New Bag/Given 09/08/18 2219)    Mobility walks with device

## 2018-09-10 ENCOUNTER — Other Ambulatory Visit (HOSPITAL_COMMUNITY): Payer: Medicare Other

## 2018-09-10 ENCOUNTER — Ambulatory Visit: Payer: Medicare Other

## 2018-09-10 ENCOUNTER — Other Ambulatory Visit: Payer: Self-pay | Admitting: Hematology and Oncology

## 2018-09-10 DIAGNOSIS — C3490 Malignant neoplasm of unspecified part of unspecified bronchus or lung: Secondary | ICD-10-CM

## 2018-09-10 DIAGNOSIS — R531 Weakness: Secondary | ICD-10-CM

## 2018-09-10 DIAGNOSIS — E440 Moderate protein-calorie malnutrition: Secondary | ICD-10-CM

## 2018-09-10 DIAGNOSIS — R7881 Bacteremia: Secondary | ICD-10-CM

## 2018-09-10 DIAGNOSIS — N183 Chronic kidney disease, stage 3 (moderate): Secondary | ICD-10-CM

## 2018-09-10 DIAGNOSIS — R569 Unspecified convulsions: Secondary | ICD-10-CM

## 2018-09-10 DIAGNOSIS — Z6821 Body mass index (BMI) 21.0-21.9, adult: Secondary | ICD-10-CM

## 2018-09-10 DIAGNOSIS — G9340 Encephalopathy, unspecified: Secondary | ICD-10-CM

## 2018-09-10 DIAGNOSIS — I82409 Acute embolism and thrombosis of unspecified deep veins of unspecified lower extremity: Secondary | ICD-10-CM

## 2018-09-10 DIAGNOSIS — C7931 Secondary malignant neoplasm of brain: Principal | ICD-10-CM

## 2018-09-10 LAB — CBC
HCT: 34.7 % — ABNORMAL LOW (ref 39.0–52.0)
HEMOGLOBIN: 11.8 g/dL — AB (ref 13.0–17.0)
MCH: 32.3 pg (ref 26.0–34.0)
MCHC: 34 g/dL (ref 30.0–36.0)
MCV: 95.1 fL (ref 80.0–100.0)
Platelets: 224 10*3/uL (ref 150–400)
RBC: 3.65 MIL/uL — ABNORMAL LOW (ref 4.22–5.81)
RDW: 16.6 % — ABNORMAL HIGH (ref 11.5–15.5)
WBC: 7.9 10*3/uL (ref 4.0–10.5)
nRBC: 0 % (ref 0.0–0.2)

## 2018-09-10 LAB — URINE CULTURE

## 2018-09-10 LAB — RENAL FUNCTION PANEL
ALBUMIN: 2.7 g/dL — AB (ref 3.5–5.0)
ANION GAP: 13 (ref 5–15)
BUN: 35 mg/dL — AB (ref 8–23)
CO2: 18 mmol/L — ABNORMAL LOW (ref 22–32)
Calcium: 8.5 mg/dL — ABNORMAL LOW (ref 8.9–10.3)
Chloride: 107 mmol/L (ref 98–111)
Creatinine, Ser: 1.39 mg/dL — ABNORMAL HIGH (ref 0.61–1.24)
GFR, EST AFRICAN AMERICAN: 53 mL/min — AB (ref >60)
GFR, EST NON AFRICAN AMERICAN: 46 mL/min — AB (ref >60)
Glucose, Bld: 144 mg/dL — ABNORMAL HIGH (ref 70–99)
PHOSPHORUS: 4.9 mg/dL — AB (ref 2.5–4.6)
Potassium: 3.9 mmol/L (ref 3.5–5.1)
Sodium: 138 mmol/L (ref 135–145)

## 2018-09-10 LAB — GLUCOSE, CAPILLARY
GLUCOSE-CAPILLARY: 123 mg/dL — AB (ref 70–99)
Glucose-Capillary: 141 mg/dL — ABNORMAL HIGH (ref 70–99)
Glucose-Capillary: 118 mg/dL — ABNORMAL HIGH (ref 70–99)

## 2018-09-10 LAB — APTT
aPTT: 146 seconds — ABNORMAL HIGH (ref 24–36)
aPTT: 97 seconds — ABNORMAL HIGH (ref 24–36)

## 2018-09-10 LAB — PROTIME-INR
INR: 1.08
Prothrombin Time: 13.9 seconds (ref 11.4–15.2)

## 2018-09-10 LAB — HEPARIN LEVEL (UNFRACTIONATED)
HEPARIN UNFRACTIONATED: 1.4 [IU]/mL — AB (ref 0.30–0.70)
Heparin Unfractionated: 1.56 IU/mL — ABNORMAL HIGH (ref 0.30–0.70)

## 2018-09-10 LAB — TSH: TSH: 0.095 u[IU]/mL — AB (ref 0.350–4.500)

## 2018-09-10 MED ORDER — HEPARIN (PORCINE) IN NACL 100-0.45 UNIT/ML-% IJ SOLN
900.0000 [IU]/h | INTRAMUSCULAR | Status: DC
  Administered 2018-09-10: 900 [IU]/h via INTRAVENOUS
  Filled 2018-09-10: qty 250

## 2018-09-10 MED ORDER — RIVAROXABAN 15 MG PO TABS
15.0000 mg | ORAL_TABLET | Freq: Two times a day (BID) | ORAL | Status: DC
  Administered 2018-09-11: 15 mg via ORAL
  Filled 2018-09-10 (×2): qty 1

## 2018-09-10 NOTE — Progress Notes (Signed)
Wyatt Stein   DOB:12-28-1933   GD#:[id number]    Assessment & Plan:  Metastatic lung cancer to brain He has significant decline in overall performance status and now has altered mental status. I recommend family meeting at 3 PM today to discuss goals of care I do not believe he is currently a candidate for systemic chemotherapy.  I have coancelled port placement  New brain metastases causing altered mental status and recurrent seizures Mental status has improved with high-dose dexamethasone and IV antiseizure medicine With new recurrent brain mets, he is a poor candidate for any treatment Continue antiseizure medications for now until after family meeting  Gram positive bacteremia Contaminant versus real bacteremia Continue broad-spectrum IV antibiotics  CKD stage III Stable.  Moderate protein calorie malnutrition Due to overall health.  Nutritional supplement as tolerated  Severe weakness Multifactorial, likely due to new brain lesions  Recent DVT Continue anticoagulation therapy as directed  Goals of care discussion I have spoken with his wife this morning and will plan family meeting at 3 PM to discuss goals of care He is currently not a chemotherapy candidate and I would recommend potentially palliative care/hospice only at this point  Discharge planning Unknown, pending further discussion later today  I will add addendum after family meeting. [name], MD 09/10/2018  7:50 AM   Subjective: The patient is awake and alert.  However, he is not oriented.  He could not tell me the date, time, and did not recognize who I was.  Nursing staff did not report any seizures.  Family members are not available.   Patient was readmitted on 10/27 with altered mental status. Repeat brain imaging showed new intracranial metastasis. Blood culture came back positive for gram positive cocci.  Oncology History   Squamous cell PD-L1 1%     Brain metastases ([place])   07/13/2018  Initial Diagnosis    Brain metastases (HCC)     Bronchogenic cancer of right lung ([place])   06/21/2018 Imaging    Large RIGHT upper lobe mass 9.6 x 5.5 x 8.4 cm in size consistent with a primary pulmonary neoplasm.  Mass abuts the anterior chest wall, mediastinum and superior RIGHT hilum and demonstrates central necrosis.  Postobstructive pneumonitis versus lymphangitic tumor spread in RIGHT upper lobe.  Underlying pan lobular emphysema.  Tiny scattered nonspecific ground-glass opacities in the lower lobes.  Mediastinal adenopathy as above.  Aortic Atherosclerosis (ICD10-I70.0) and Emphysema (ICD10-J43.9)    07/02/2018 Pathology Results    Endobronchial biopsy, RUL - COMPLETELY NECROTIC TISSUE, SUGGESTIVE OF A NECROTIC TUMOR.    07/02/2018 Procedure    Bronchoscopy Procedure Note  Date of Operation: 07/02/2018   Pre-op Diagnosis: RUL mass  Post-op Diagnosis: same  Surgeon: Christinia Gully  Anesthesia: Monitored Local Anesthesia with Sedation Time Started:  0814 versed 2.5 mg IV/ demerol 25 mg IV  Time Stopped:  0825  Operation: Video Flexible fiberoptic bronchoscopy, diagnostic   Findings: mass prolapsing out of RUL apical segment completely obst it   Specimen:  BAL rul/ endobronchial bx x 2 plus portion of mass suctioned off   Estimated Blood Loss: min  Complications: none  Indications and History: See updated H and P same date. The risks, benefits, complications, treatment options and expected outcomes were discussed with the patient.  The possibilities of reaction to medication, pulmonary aspiration, perforation of a viscus, bleeding, failure to diagnose a condition and creating a complication requiring transfusion or operation were discussed with the patient who freely signed the consent.  Description of Procedure: The patient was re-examined in the bronchoscopy suite and the site of surgery properly noted/marked.  The patient was identified  and the  procedure verified as Flexible Fiberoptic Bronchoscopy.  A Time Out was held and the above information confirmed.   After the induction of topical nasopharyngeal anesthesia, the patient was positioned  and the bronchoscope was passed through the R naris. The vocal cords were visualized and  1% buffered lidocaine 5 ml was topically placed onto the cords. The cords were nl. The scope was then passed into the trachea.  1% buffered lidocaine given topically. Airways inspected bilaterally to the subsegmental level with the following findings:  Airways nl x for mass prolapsing out of RUL apical segment completely obst it    Interventions: BAL rul/ endobronchial bx x 2 RUl     07/12/2018 PET scan    1. Hypermetabolic RIGHT upper lobe mass consists primary bronchogenic carcinoma. 2. Ipsilateral hypermetabolic mediastinal nodal metastasis. 3. Metastatic brain lesion in the RIGHT frontal lobe partially imaged. See comparison head CT from 07/13/2018 4. No supraclavicular adenopathy. 5. No evidence of metastatic disease below the diaphragms.    07/13/2018 Imaging    CT head 16 x 18 mm hyperdense mass right basal ganglia with extensive surrounding edema and 7 mm midline shift. Findings most compatible with hemorrhagic metastatic disease. Patient has a large lung mass which is hypermetabolic on PET scan yesterday compatible with lung carcinoma.  Moderate atrophy and moderate chronic microvascular ischemia     07/13/2018 Imaging    MRI brain Enhancing mass lesion right basal ganglia with extensive surrounding edema. Given history of lung cancer this is consistent with metastatic disease. Second 2 mm enhancing lesion in the right frontal lobe also consistent with metastatic disease. There is mass-effect and 7 mm midline shift to the left due to extensive edema.    07/16/2018 Procedure    Status post CT-guided biopsy of right upper lobe mass. Tissue specimen sent to pathology for complete histopathologic  analysis.    07/16/2018 Pathology Results    Lung, biopsy, right upper lobe - NON-SMALL CELL CARCINOMA, SEE COMMENT. Microscopic Comment Immunohistochemistry is focally positive for cytokeratin 5/6 and p63. TTF-1 and Napsin A are negative. The immunoprofile slightly favors a squamous cell carcinoma. There is likely sufficient tissue for additional studies if requested (Block 1A). Dr. Lyndon Code has reviewed the case.    07/19/2018 Cancer Staging    Staging form: Lung, AJCC 8th Edition - Clinical: Stage IV (cT4, cN2, cM1) - Signed by [name], MD on 07/19/2018    09/09/2018 Imaging    MRI brain 1. Four new subcentimeter enhancing lesions in the cerebellum and right lentiform nucleus consistent with metastases. Cerebellar edema without posterior fossa mass effect. 2. Unchanged size of treated anterior right basal ganglia and right frontal metastases. 3. 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 4. Worsening T2 hyperintensity throughout the right greater than left cerebral white matter compared to the prior MRI. This may reflect a combination of persistent severe right-sided vasogenic edema related to metastatic disease and underlying chronic white matter disease as well as a superimposed more acute process such as transependymal CSF flow related to the hydrocephalus, infectious encephalitis, and/or post treatment changes.       Objective:  Vitals:   09/10/18 0500 09/10/18 0600  BP: (!) 158/98 (!) 145/98  Pulse:  85  Resp: 20 20  Temp:    SpO2:  94%     Intake/Output Summary (  Last 24 hours) at 09/10/2018 0750 Last data filed at 09/10/2018 5053 Gross per 24 hour  Intake 1450.26 ml  Output 1150 ml  Net 300.26 ml    GENERAL:alert, no distress and comfortable SKIN: skin color, texture, turgor are normal, no rashes or significant lesions EYES: normal, Conjunctiva are pink and non-injected, sclera clear OROPHARYNX:no exudate, no  erythema and lips, buccal mucosa, and tongue normal  NECK: supple, thyroid normal size, non-tender, without nodularity LYMPH:  no palpable lymphadenopathy in the cervical, axillary or inguinal LUNGS: clear to auscultation and percussion with normal breathing effort HEART: regular rate & rhythm and no murmurs and no lower extremity edema ABDOMEN:abdomen soft, non-tender and normal bowel sounds Musculoskeletal:no cyanosis of digits and no clubbing  NEURO: alert but not fully oriented, mildly dysarthric/mumbles   Labs:  Lab Results  Component Value Date   WBC 7.9 09/10/2018   HGB 11.8 (L) 09/10/2018   HCT 34.7 (L) 09/10/2018   MCV 95.1 09/10/2018   PLT 224 09/10/2018   NEUTROABS 7.8 (H) 09/09/2018    Lab Results  Component Value Date   NA 138 09/10/2018   K 3.9 09/10/2018   CL 107 09/10/2018   CO2 18 (L) 09/10/2018    Studies:  Ct Head Wo Contrast  Result Date: 09/08/2018 CLINICAL DATA:  History of lung cancer post radiation two weeks ago. Weakness past 5 days with fever. Known brain metastases. EXAM: CT HEAD WITHOUT CONTRAST TECHNIQUE: Contiguous axial images were obtained from the base of the skull through the vertex without intravenous contrast. COMPARISON:  09/03/2018 and 07/31/2018 FINDINGS: Brain: Examination demonstrates evidence patient's known right frontal brain metastasis measuring approximately 2.4 cm in diameter with moderate surrounding edema and mass effect without significant change. There is subtle midline shift to the left of 5 mm without significant change. Chronic ischemic microvascular disease is present. No new findings. Vascular: No hyperdense vessel or unexpected calcification. Skull: Normal. Negative for fracture or focal lesion. Sinuses/Orbits: Orbits are within normal. Minimal mucosal membrane thickening over the right maxillary sinus. Other: None. IMPRESSION: Known 2.4 cm right frontal metastasis with surrounding edema and mild mass effect with midline shift  to the left of 5 mm as these findings are unchanged. Chronic ischemic microvascular disease. Minimal chronic sinus inflammatory change. Electronically Signed   By: Marin Olp M.D.   On: 09/08/2018 21:39   Mr Jeri Cos ZJ Contrast  Result Date: 09/09/2018 CLINICAL DATA:  Non-small cell lung cancer with brain metastases treated with SRS on 07/30/2018. Increasing confusion. EXAM: MRI HEAD WITHOUT AND WITH CONTRAST TECHNIQUE: Multiplanar, multiecho pulse sequences of the brain and surrounding structures were obtained without and with intravenous contrast. CONTRAST:  6 mL Gadavist COMPARISON:  Head CT 09/08/2018 and MRI 07/24/2018 FINDINGS: Some sequences are mildly to moderately motion degraded. Brain: There is no evidence of acute infarct, intracranial hemorrhage, or extra-axial fluid collection. There is persistent extensive vasogenic edema throughout the predominantly anterior aspect of the right cerebral hemisphere as previously seen. Extensive, confluent T2 hyperintensity throughout the white matter of the more posterior right cerebral hemisphere as well as throughout the left cerebral hemisphere has progressed from the prior MRI. Leftward midline shift measures 7 mm, similar to the prior MRI. There is persistent mass effect on the right lateral ventricle, however compared to the prior MRI there is new moderate dilatation of the left lateral, third, and fourth ventricles including of the left temporal horn. Heterogeneously enhancing mass anteriorly in the right basal ganglia is similar in size to  the prior MRI measuring 3.1 x 2.4 x 2.3 cm (series 11, image 29, previously 2.9 x 2.3 x 2.3 cm upon remeasurement). A 2 mm enhancing lesion located more anteriorly in the right frontal lobe is unchanged (series 11, image 28). A 7 mm enhancing lesion in the posterior right lentiform nucleus is new (series 11, image 31). There are 2 new enhancing lesions in the left cerebellar hemisphere each measuring 7 mm (series 11,  images 15 and 16), and there is a new 3 mm enhancing lesion in the cerebellar vermis (series 11, image 18). There is new edema throughout the left cerebellar hemisphere extending into the vermis without mass effect. A 9 mm enhancing dural-based mass over the left frontal convexity is unchanged and is without associated mass effect. Vascular: Major intracranial vascular flow voids are preserved. Skull and upper cervical spine: Unremarkable bone marrow signal. Sinuses/Orbits: Unremarkable orbits. Mild right ethmoid and right maxillary sinus mucosal thickening. No significant mastoid fluid. Other: None. IMPRESSION: 1. Four new subcentimeter enhancing lesions in the cerebellum and right lentiform nucleus consistent with metastases. Cerebellar edema without posterior fossa mass effect. 2. Unchanged size of treated anterior right basal ganglia and right frontal metastases. 3. 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 4. Worsening T2 hyperintensity throughout the right greater than left cerebral white matter compared to the prior MRI. This may reflect a combination of persistent severe right-sided vasogenic edema related to metastatic disease and underlying chronic white matter disease as well as a superimposed more acute process such as transependymal CSF flow related to the hydrocephalus, infectious encephalitis, and/or post treatment changes. Electronically Signed   By: Logan Bores M.D.   On: 09/09/2018 13:24   Dg Chest Port 1 View  Result Date: 09/08/2018 CLINICAL DATA:  History of lung cancer with last radiation treatment 2 weeks ago. Weakness over the past 5 days with new onset fever. Possible aspiration. EXAM: PORTABLE CHEST 1 VIEW COMPARISON:  07/16/2018 as well as PET-CT 07/12/2018 FINDINGS: Lungs are adequately inflated demonstrate mild interval decrease in size of known 7 cm right upper lobe lung cancer. Remainder of the lungs are clear.  Cardiomediastinal silhouette and remainder of the exam is unchanged. IMPRESSION: No acute findings. Slight interval decrease in size of known 7 cm right upper lobe lung cancer. Electronically Signed   By: Marin Olp M.D.   On: 09/08/2018 20:01

## 2018-09-10 NOTE — Progress Notes (Signed)
ANTICOAGULATION CONSULT NOTE - Follow Up Consult  Pharmacy Consult for Heparin  Indication: DVT, rivaroxaban PTA  Allergies  Allergen Reactions  . Ace Inhibitors Anaphylaxis and Swelling  . Ramipril Anaphylaxis and Itching   Patient Measurements: Height: 5\' 7"  (170.2 cm) Weight: 134 lb 0.6 oz (60.8 kg) IBW/kg (Calculated) : 66.1 Heparin Dosing Weight:   Vital Signs: Temp: 98.2 F (36.8 C) (10/29 0331) Temp Source: Oral (10/29 0331) BP: 158/98 (10/29 0500)  Labs: Recent Labs    09/08/18 1901 09/09/18 0340 09/09/18 1008 09/09/18 1936 09/10/18 0332  HGB 12.8* 11.6*  --   --  11.8*  HCT 38.5* 35.5*  --   --  34.7*  PLT 227 197  --   --  224  APTT  --   --  53* 108* 146*  HEPARINUNFRC  --   --  1.18*  --  1.56*  CREATININE 1.39* 1.22  --   --  1.39*   Estimated Creatinine Clearance: 35.2 mL/min (A) (by C-G formula based on SCr of 1.39 mg/dL (H)).  Medications:  rivaroxaban 15 mg bid PTA last dose 10/27 @ 0900  Assessment: 84 y/o M with a h/o stage IV lung cancer with brain metastases and DVT previously on rivaroxaban transitioned to heparin infusion due to encephalopathy and possible need for lumbar puncture.   10/28: - aPTT at 1926, now slightly above goal (108sec) on heparin 1150 units/hr -No bleeding or infusion related issues reported by RN - Decreased heparin infusion to 1100 units/hr.   09/10/18  APTT increased further to 146 sec despite rate decrease  Heparin level remains elevated due to Rivaroxaban   Continue to manage Heparin based on aPTT  Goal of Therapy:  aPTT 66-102s  Heparin level 0.3-0.7 units/ml Monitor platelets by anticoagulation protocol: Yes   Plan:   Decrease Heparin infusion to 900 units/hr  Recheck aPTT at 1400  Daily HL until levels correlate  Daily CBC  Minda Ditto PharmD Pager [phone] 09/10/2018, 6:10 AM

## 2018-09-10 NOTE — Progress Notes (Signed)
[facility] PROGRESS NOTE  TYTAN SANDATE JDB:[id number] DOB: [birth year] DOA: 09/08/2018 PCP: Wenda Low, MD   Narrative: 33 m  Bronchogenic CA right lung status post radiation therapy to brain and chest early October, Gold COPD stage II, nausea It is documented that he has had seizures followed and profound weakness -per Dr. Alvy Bimler 10/8 suggest  PET scan was being contemplated and may be combination carboplatin gemcitabine as rx further line of therapy  Presented on 10/22 with one episode of hematemesis and mild confusion but was discharged home-since then minimally able to get out of recliner requiring full assistance and much more confused not making sense noted temperature of 101 poor appetite  Oncology was consulted and recommended broad-spectrum antibiotics although no focal source noted  Neurology Dr. Malen Gauze was consulted and recommended Decadron 4 mg every 6 and Keppra loading dose along with some 50 every 12 recommended MRI brain EEG and eventually need for lumbar puncture      A & Plan Metabolic encephalopathy-Infectious with meningitis vs Midline shift from Ca-he is already improving-has been on Xarelto--could also have missed his meds for Sz prophylaxis as well [Keppra]? No LP Hospice palliative trajectory Cont Decadron 88m q6, Keppra 750 bid iv  Bacteremia MSSA and Strep-BC reveal these as prelim-do not de-escalate for now until further clarity re: Hospice Choice offer per CSW re: facility  Bronchogenic CA right lung with metastases to brain- Hyperkalemia on admission-resolved now Mild met acidosis-probably related to either infection or Sz Hypertensive urgency-continue hydralazine and metoprolol  COPD-stable DVT on Xarelto at home-resume xarelto-no LP Chronic anemia   Heparin gtt, full code,will need pallitve discussion--needs free-standing hospice  transfer to med surg  Now DNR   SVerlon Au MD  [facility] Direct contact:  [phone]--Via amion app OR  --[email or web address]; password [email or web address]  7PM-7AM contact night coverage as above 09/10/2018, 7:31 AM  LOS: 1 day   Consultants:  Oncology  ccm  Procedures:  Ct head  Antimicrobials:  Ceftriaxone   Vancomycin  Ampicillin  valacyclovir  Interval history/Subjective:  Confused Picking at abdomen  Objective:  Vitals:  Vitals:   09/10/18 0500 09/10/18 0600  BP: (!) 158/98 (!) 145/98  Pulse:  85  Resp: 20 20  Temp:    SpO2:  94%    Exam:  confused pleasant eomi ncat arus s1 s2 no m abd soft  I have personally reviewed the following:   Labs:  K down from 5.8->3.9  CO2 20->18  Bun /cre from 31/1.39->26/1.2->35/1.39  WBC 7.9, PLT 224  Imaging studies:  CT head shows some mass effect  Medical tests:   n   Test discussed with performing physician:  n  Decision to obtain old records:  n  Review and summation of old records:  y  Scheduled Meds: . dexamethasone  4 mg Intravenous Q6H  . latanoprost  1 drop Both Eyes QHS  . mouth rinse  15 mL Mouth Rinse BID   Continuous Infusions: . sodium chloride 10 mL/hr at 09/10/18 0600  . ampicillin (OMNIPEN) IV Stopped (09/10/18 0236)  . cefTRIAXone (ROCEPHIN)  IV Stopped (09/09/18 2037)  . ganciclovir (CYTOVENE) IV Stopped (09/09/18 1057)  . heparin 900 Units/hr (09/10/18 0618)  . levETIRAcetam Stopped (09/09/18 2145)  . vancomycin Stopped (09/10/18 0022)    Principal Problem:   Acute encephalopathy Active Problems:   Seizure (HCC)   COPD GOLD 0/ still smoking   Bronchogenic cancer of right lung (HCC)   Fever   DV[place]) [place]  LOS: 1 day          

## 2018-09-10 NOTE — Progress Notes (Signed)
I have further discussion with patient's family members for another 45 minutes, in addition to early assessment this morning.  Dr. Verlon Au was present for half of the meeting.  His wife [name], his daughter and son-in-law are present  I reviewed information in regards to worsening brain metastases including new lesions, hydrocephalus changes and severe right sided vasogenic edema with midline shift, bacteremia, neurological deficit with dysphasia and weakness. He was diagnosed with recent blood clot but anticoagulation therapy was placed on hold in anticipation for possible lumbar puncture.  Due to his progressive decline, he is currently not a candidate for systemic chemotherapy His wife [name] eventually has come to accept this.   However, with multiple bad news, she has difficulties excepting transitioning of care to comfort measures only She would like to continue on anticoagulation therapy, broad-spectrum IV antibiotics, high-dose IV steroids with antiseizure medicines and aggressive supportive measures until she can gather multiple family members to be with him.  I have consulted palliative care team that will come and assess him and family tomorrow  She agreed not to pursue additional work-up including imaging studies or lumbar puncture  She is aware I will not be around over the next few weeks.    My colleague, Dr. Maylon Peppers has agreed to follow over the next few days. I have cancelled his outpatient follow-up appointment  I have addressed all her questions and concerns

## 2018-09-10 NOTE — Consult Note (Signed)
   [facility] CM Inpatient Consult   09/10/2018  SHENANDOAH VANDERGRIFF 1934/09/25 [id number]     Patient screened for potential [facility] Care Management services due to unplanned readmission risk score of 40% (extreme).  Chart reviewed. Patient is currently in stepdown unit. Noted palliative consult pending. No identifiable [facility] Care Management needs assessed at this time.   Marthenia Rolling, MSN-Ed, RN,BSN [facility] Liaison [phone]

## 2018-09-10 NOTE — Progress Notes (Signed)
[place] for Heparin  Indication: DVT, rivaroxaban PTA  Allergies  Allergen Reactions  . Ace Inhibitors Anaphylaxis and Swelling  . Ramipril Anaphylaxis and Itching   Patient Measurements: Height: 5\' 7"  (170.2 cm) Weight: 134 lb 0.6 oz (60.8 kg) IBW/kg (Calculated) : 66.1 Heparin Dosing Weight: 61 kg  Vital Signs: Temp: 98.4 F (36.9 C) (10/29 1148) Temp Source: Oral (10/29 1148) BP: 132/90 (10/29 1300) Pulse Rate: 87 (10/29 1300)  Labs: Recent Labs    09/08/18 1901 09/09/18 0340  09/09/18 1008 09/09/18 1936 09/10/18 0332 09/10/18 1445  HGB 12.8* 11.6*  --   --   --  11.8*  --   HCT 38.5* 35.5*  --   --   --  34.7*  --   PLT 227 197  --   --   --  224  --   APTT  --   --    < > 53* 108* 146* 97*  LABPROT  --   --   --   --   --   --  13.9  INR  --   --   --   --   --   --  1.08  HEPARINUNFRC  --   --   --  1.18*  --  1.56* 1.40*  CREATININE 1.39* 1.22  --   --   --  1.39*  --    < > = values in this interval not displayed.   Estimated Creatinine Clearance: 35.2 mL/min (A) (by C-G formula based on SCr of 1.39 mg/dL (H)).  Medications:  rivaroxaban 15 mg bid PTA last dose 10/27 @ 0900  Assessment: 84 y/o M with a h/o stage IV lung cancer with brain metastases and DVT previously on rivaroxaban transitioned to heparin infusion due to encephalopathy and possible need for lumbar puncture.   09/10/18  Hgb low but stable since yesterday; Plt wnl  APTT above goal earlier this AM, but now within goal range  Heparin level remains elevated due to Rivaroxaban   Continue to manage Heparin based on aPTT  Goal of Therapy:  aPTT 66-102s  Heparin level 0.3-0.7 units/ml Monitor platelets by anticoagulation protocol: Yes   Plan:   Continue Heparin infusion at 900 units/hr  Recheck confirmatory aPTT with AM labs  Daily CBC and heparin level; may stop checking aPTT once heparin level correlates with aPTT  Reuel Boom, PharmD,  BCPS [phone] 09/10/2018, 4:28 PM

## 2018-09-11 ENCOUNTER — Inpatient Hospital Stay: Payer: Medicare Other

## 2018-09-11 ENCOUNTER — Ambulatory Visit: Admission: RE | Admit: 2018-09-11 | Payer: Medicare Other | Source: Ambulatory Visit

## 2018-09-11 DIAGNOSIS — Z7189 Other specified counseling: Secondary | ICD-10-CM

## 2018-09-11 DIAGNOSIS — Z515 Encounter for palliative care: Secondary | ICD-10-CM

## 2018-09-11 LAB — GLUCOSE, CAPILLARY
Glucose-Capillary: 100 mg/dL — ABNORMAL HIGH (ref 70–99)
Glucose-Capillary: 108 mg/dL — ABNORMAL HIGH (ref 70–99)
Glucose-Capillary: 111 mg/dL — ABNORMAL HIGH (ref 70–99)
Glucose-Capillary: 116 mg/dL — ABNORMAL HIGH (ref 70–99)

## 2018-09-11 LAB — CULTURE, BLOOD (ROUTINE X 2): Special Requests: ADEQUATE

## 2018-09-11 MED ORDER — RIVAROXABAN 10 MG PO TABS: 20.0000 mg | ORAL_TABLET | Freq: Every day | ORAL | Status: DC

## 2018-09-11 MED ORDER — RIVAROXABAN 15 MG PO TABS
15.0000 mg | ORAL_TABLET | Freq: Two times a day (BID) | ORAL | Status: DC
  Administered 2018-09-11 – 2018-09-12 (×3): 15 mg via ORAL
  Filled 2018-09-11 (×5): qty 1

## 2018-09-11 NOTE — Progress Notes (Signed)
Wyatt Stein   DOB:[birth year]   YO#:[id number]    Assessment & Plan:  Metastatic lung cancer to brain -Not a candidate for systemic chemotherapy due to decline in performance status and altered mental status -Dr. Alvy Bimler met with the family on 09/10/2018 regarding goals of care; after extensive discussion, the family agreed not to pursue any additional work-up related to the metastatic lung cancer, including lumbar puncture  New brain metastases causing altered mental status and recurrent seizures -Patient is alert this morning but not oriented to place, date, or situation -Based on the family meeting, the decision was made to continue antiseizure medications and high-dose IV steroids  Gram positive bacteremia -Possibly contaminant  -I will defer the management of IV antibiotics to the internal medicine team  CKD stage III -Stable.  Moderate protein calorie malnutrition -Due to overall health.  Nutritional supplement as tolerated  Severe weakness Multifactorial, likely due to new brain lesions  Recent DVT -Continue anticoagulation therapy for now based on recent family meeting  Goals of care discussion -Dr. Alvy Bimler met with the family on 09/10/2018 regarding goals of care -Given his overall decline in performance status and altered mental status, family was informed that he was not a candidate for chemotherapy  -The patient's wife has some difficulties accepting the poor prognosis transitioning toward comfort care, but agreed not to pursue any further work-up related to the metastatic lung cancer -She would like to continue supportive measures, including anticoagulation, IV antibiotics, high-dose IV steroids and antiseizure medications, until she can gather multiple family members to be with him -Palliative care team has been consulted  Thank you for the consult.  Please do not hesitate contact me if there are any questions.  Tish Men, MD 09/11/2018  9:33  AM   Subjective: Wyatt Stein is awake and alert this morning and lying comfortably in bed, but he is not oriented to place, date, or situation.  No family members were present at bedside.  He denies any complaint this morning, such as chest pain, dyspnea, hemoptysis, abdominal pain, nausea, vomiting, or diarrhea.   Oncology History   Squamous cell PD-L1 1%     Brain metastases ([place])   07/13/2018 Initial Diagnosis    Brain metastases (HCC)     Bronchogenic cancer of right lung ([place])   06/21/2018 Imaging    Large RIGHT upper lobe mass 9.6 x 5.5 x 8.4 cm in size consistent with a primary pulmonary neoplasm.  Mass abuts the anterior chest wall, mediastinum and superior RIGHT hilum and demonstrates central necrosis.  Postobstructive pneumonitis versus lymphangitic tumor spread in RIGHT upper lobe.  Underlying pan lobular emphysema.  Tiny scattered nonspecific ground-glass opacities in the lower lobes.  Mediastinal adenopathy as above.  Aortic Atherosclerosis (ICD10-I70.0) and Emphysema (ICD10-J43.9)    07/02/2018 Pathology Results    Endobronchial biopsy, RUL - COMPLETELY NECROTIC TISSUE, SUGGESTIVE OF A NECROTIC TUMOR.    07/02/2018 Procedure    Bronchoscopy Procedure Note  Date of Operation: 07/02/2018   Pre-op Diagnosis: RUL mass  Post-op Diagnosis: same  Surgeon: Christinia Gully  Anesthesia: Monitored Local Anesthesia with Sedation Time Started:  0814 versed 2.5 mg IV/ demerol 25 mg IV  Time Stopped:  0825  Operation: Video Flexible fiberoptic bronchoscopy, diagnostic   Findings: mass prolapsing out of RUL apical segment completely obst it   Specimen:  BAL rul/ endobronchial bx x 2 plus portion of mass suctioned off   Estimated Blood Loss: min  Complications: none  Indications and History: See updated H  and P same date. The risks, benefits, complications, treatment options and expected outcomes were discussed with the patient.  The possibilities of  reaction to medication, pulmonary aspiration, perforation of a viscus, bleeding, failure to diagnose a condition and creating a complication requiring transfusion or operation were discussed with the patient who freely signed the consent.    Description of Procedure: The patient was re-examined in the bronchoscopy suite and the site of surgery properly noted/marked.  The patient was identified  and the procedure verified as Flexible Fiberoptic Bronchoscopy.  A Time Out was held and the above information confirmed.   After the induction of topical nasopharyngeal anesthesia, the patient was positioned  and the bronchoscope was passed through the R naris. The vocal cords were visualized and  1% buffered lidocaine 5 ml was topically placed onto the cords. The cords were nl. The scope was then passed into the trachea.  1% buffered lidocaine given topically. Airways inspected bilaterally to the subsegmental level with the following findings:  Airways nl x for mass prolapsing out of RUL apical segment completely obst it    Interventions: BAL rul/ endobronchial bx x 2 RUl     07/12/2018 PET scan    1. Hypermetabolic RIGHT upper lobe mass consists primary bronchogenic carcinoma. 2. Ipsilateral hypermetabolic mediastinal nodal metastasis. 3. Metastatic brain lesion in the RIGHT frontal lobe partially imaged. See comparison head CT from 07/13/2018 4. No supraclavicular adenopathy. 5. No evidence of metastatic disease below the diaphragms.    07/13/2018 Imaging    CT head 16 x 18 mm hyperdense mass right basal ganglia with extensive surrounding edema and 7 mm midline shift. Findings most compatible with hemorrhagic metastatic disease. Patient has a large lung mass which is hypermetabolic on PET scan yesterday compatible with lung carcinoma.  Moderate atrophy and moderate chronic microvascular ischemia     07/13/2018 Imaging    MRI brain Enhancing mass lesion right basal ganglia with extensive  surrounding edema. Given history of lung cancer this is consistent with metastatic disease. Second 2 mm enhancing lesion in the right frontal lobe also consistent with metastatic disease. There is mass-effect and 7 mm midline shift to the left due to extensive edema.    07/16/2018 Procedure    Status post CT-guided biopsy of right upper lobe mass. Tissue specimen sent to pathology for complete histopathologic analysis.    07/16/2018 Pathology Results    Lung, biopsy, right upper lobe - NON-SMALL CELL CARCINOMA, SEE COMMENT. Microscopic Comment Immunohistochemistry is focally positive for cytokeratin 5/6 and p63. TTF-1 and Napsin A are negative. The immunoprofile slightly favors a squamous cell carcinoma. There is likely sufficient tissue for additional studies if requested (Block 1A). Dr. Lyndon Code has reviewed the case.    07/19/2018 Cancer Staging    Staging form: Lung, AJCC 8th Edition - Clinical: Stage IV (cT4, cN2, cM1) - Signed by [name], MD on 07/19/2018    09/09/2018 Imaging    MRI brain 1. Four new subcentimeter enhancing lesions in the cerebellum and right lentiform nucleus consistent with metastases. Cerebellar edema without posterior fossa mass effect. 2. Unchanged size of treated anterior right basal ganglia and right frontal metastases. 3. 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 4. Worsening T2 hyperintensity throughout the right greater than left cerebral white matter compared to the prior MRI. This may reflect a combination of persistent severe right-sided vasogenic edema related to metastatic disease and underlying chronic white matter disease as well as a superimposed more acute process  such as transependymal CSF flow related to the hydrocephalus, infectious encephalitis, and/or post treatment changes.       Objective:  Vitals:   09/11/18 0552 09/11/18 0728  BP: (!) 172/105 (!) 141/98  Pulse: 90 75  Resp: 20    Temp: 97.6 F (36.4 C)   SpO2: 98%      Intake/Output Summary (Last 24 hours) at 09/11/2018 0933 Last data filed at 09/11/2018 0600 Gross per 24 hour  Intake 1114.74 ml  Output 450 ml  Net 664.74 ml    GENERAL:alert, no distress and comfortable SKIN: skin color, texture, turgor are normal, no rashes or significant lesions EYES: normal, Conjunctiva are pink and non-injected, sclera clear OROPHARYNX:no exudate, no erythema and lips, buccal mucosa, and tongue normal  NECK: supple LUNGS: clear to auscultation and percussion with normal breathing effort HEART: regular rate & rhythm and no murmurs and no lower extremity edema ABDOMEN:abdomen soft, non-tender and normal bowel sounds NEURO: alert but not oriented, mildly dysarthric/mumbles   Labs:  Lab Results  Component Value Date   WBC 7.9 09/10/2018   HGB 11.8 (L) 09/10/2018   HCT 34.7 (L) 09/10/2018   MCV 95.1 09/10/2018   PLT 224 09/10/2018   NEUTROABS 7.8 (H) 09/09/2018    Lab Results  Component Value Date   NA 138 09/10/2018   K 3.9 09/10/2018   CL 107 09/10/2018   CO2 18 (L) 09/10/2018    Studies:  Mr Jeri Cos BC Contrast  Result Date: 09/09/2018 CLINICAL DATA:  Non-small cell lung cancer with brain metastases treated with SRS on 07/30/2018. Increasing confusion. EXAM: MRI HEAD WITHOUT AND WITH CONTRAST TECHNIQUE: Multiplanar, multiecho pulse sequences of the brain and surrounding structures were obtained without and with intravenous contrast. CONTRAST:  6 mL Gadavist COMPARISON:  Head CT 09/08/2018 and MRI 07/24/2018 FINDINGS: Some sequences are mildly to moderately motion degraded. Brain: There is no evidence of acute infarct, intracranial hemorrhage, or extra-axial fluid collection. There is persistent extensive vasogenic edema throughout the predominantly anterior aspect of the right cerebral hemisphere as previously seen. Extensive, confluent T2 hyperintensity throughout the white matter of the more posterior right  cerebral hemisphere as well as throughout the left cerebral hemisphere has progressed from the prior MRI. Leftward midline shift measures 7 mm, similar to the prior MRI. There is persistent mass effect on the right lateral ventricle, however compared to the prior MRI there is new moderate dilatation of the left lateral, third, and fourth ventricles including of the left temporal horn. Heterogeneously enhancing mass anteriorly in the right basal ganglia is similar in size to the prior MRI measuring 3.1 x 2.4 x 2.3 cm (series 11, image 29, previously 2.9 x 2.3 x 2.3 cm upon remeasurement). A 2 mm enhancing lesion located more anteriorly in the right frontal lobe is unchanged (series 11, image 28). A 7 mm enhancing lesion in the posterior right lentiform nucleus is new (series 11, image 31). There are 2 new enhancing lesions in the left cerebellar hemisphere each measuring 7 mm (series 11, images 15 and 16), and there is a new 3 mm enhancing lesion in the cerebellar vermis (series 11, image 18). There is new edema throughout the left cerebellar hemisphere extending into the vermis without mass effect. A 9 mm enhancing dural-based mass over the left frontal convexity is unchanged and is without associated mass effect. Vascular: Major intracranial vascular flow voids are preserved. Skull and upper cervical spine: Unremarkable bone marrow signal. Sinuses/Orbits: Unremarkable orbits. Mild right ethmoid and  right maxillary sinus mucosal thickening. No significant mastoid fluid. Other: None. IMPRESSION: 1. Four new subcentimeter enhancing lesions in the cerebellum and right lentiform nucleus consistent with metastases. Cerebellar edema without posterior fossa mass effect. 2. Unchanged size of treated anterior right basal ganglia and right frontal metastases. 3. 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 4. Worsening T2 hyperintensity throughout the right greater than  left cerebral white matter compared to the prior MRI. This may reflect a combination of persistent severe right-sided vasogenic edema related to metastatic disease and underlying chronic white matter disease as well as a superimposed more acute process such as transependymal CSF flow related to the hydrocephalus, infectious encephalitis, and/or post treatment changes. Electronically Signed   By: Logan Bores M.D.   On: 09/09/2018 13:24

## 2018-09-11 NOTE — Progress Notes (Addendum)
CSW consult Plan: Patient will transition to residential hospice.  CSW made a referral to Harmon Pier at [facility] per request of the patient family.  CSW left a voicemail for the patient daughter Colletta [place].   Kathrin Greathouse, Marlinda Mike, MSW Clinical Social Worker  [phone] 09/11/2018  2:43 PM

## 2018-09-11 NOTE — Progress Notes (Signed)
PROGRESS NOTE  Wyatt Stein RXV:[id number] DOB: [birth year] DOA: 09/08/2018 PCP: Wenda Low, MD  HPI/Recap of past 24 hours:  Only oriented to self, talking and following commands, calm, denies pain Very weak, but able to move extremities  Denies headache, no fever, no cough  Assessment/Plan: Principal Problem:   Acute encephalopathy Active Problems:   Seizure ([place])   COPD GOLD 0/ still smoking   Bronchogenic cancer of right lung (HCC)   Fever   DVT (deep venous thrombosis) ([place])   Metastatic lung cancer to brain -With new brain mets with vasogenic edema causing encephalopathy and recurrent seizures -Mri brain "Four new subcentimeter enhancing lesions in the cerebellum and right lentiform nucleus consistent with metastases. Cerebellar edema without posterior fossa mass effect. -on decadron, keppra and empicall abx with vanc/ampicillin/rocephin/ganciclovir -overall very poor prognosis, ongoing goals of care discussion, appreciate oncology and palliative care input -now DNR, likely residential hospice placement  Recent DVT -Continue anticoagulation therapy for now based on recent family meeting  CKDIII Renal function at baseline  FTT: poor prognosis  Code Status: DNR  Family Communication: patient   Disposition Plan: likely residential hospice   Consultants:  Oncology  Palliative care  Procedures:  none  Antibiotics:  As above   Objective: BP (!) 141/98   Pulse 75   Temp 97.6 F (36.4 C) (Oral)   Resp 20   Ht 5\' 7"  (1.702 m)   Wt 60.8 kg   SpO2 98%   BMI 20.99 kg/m   Intake/Output Summary (Last 24 hours) at 09/11/2018 0924 Last data filed at 09/11/2018 0600 Gross per 24 hour  Intake 1114.74 ml  Output 450 ml  Net 664.74 ml   Filed Weights   09/08/18 2257 09/09/18 0100  Weight: 64.4 kg 60.8 kg    Exam: Patient is examined daily including today on 09/11/2018, exams remain the same as of yesterday except that has changed     General:  Very weak, only oriented to self  Cardiovascular: RRR  Respiratory: CTABL  Abdomen: Soft/ND/NT, positive BS  Musculoskeletal: No Edema  Neuro: alert, only oriented to self, calm and following commands, moving arms, wiggles toes, not able to lift legs against gravity   Data Reviewed: Basic Metabolic Panel: Recent Labs  Lab 09/08/18 1901 09/09/18 0340 09/10/18 0332  NA 134* 135 138  K 5.8* 3.9 3.9  CL 102 105 107  CO2 22 20* 18*  GLUCOSE 114* 132* 144*  BUN 31* 26* 35*  CREATININE 1.39* 1.22 1.39*  CALCIUM 8.8* 8.3* 8.5*  PHOS  --   --  4.9*   Liver Function Tests: Recent Labs  Lab 09/08/18 1901 09/09/18 0340 09/10/18 0332  AST 50* 22  --   ALT 21 20  --   ALKPHOS 83 76  --   BILITOT 1.7* 0.9  --   PROT 6.2* 5.7*  --   ALBUMIN 3.1* 2.7* 2.7*   Recent Labs  Lab 09/08/18 2013  LIPASE 31   Recent Labs  Lab 09/09/18 0511  AMMONIA 23   CBC: Recent Labs  Lab 09/08/18 1901 09/09/18 0340 09/10/18 0332  WBC 6.8 9.1 7.9  NEUTROABS 5.4 7.8*  --   HGB 12.8* 11.6* 11.8*  HCT 38.5* 35.5* 34.7*  MCV 96.5 97.5 95.1  PLT 227 197 224   Cardiac Enzymes:   No results for input(s): CKTOTAL, CKMB, CKMBINDEX, TROPONINI in the last 168 hours. BNP (last 3 results) No results for input(s): BNP in the last 8760 hours.  ProBNP (  last 3 results) No results for input(s): PROBNP in the last 8760 hours.  CBG: Recent Labs  Lab 09/10/18 0543 09/10/18 1130 09/10/18 1708 09/11/18 0014 09/11/18 0639  GLUCAP 141* 123* 118* 116* 111*    Recent Results (from the past 240 hour(s))  Blood Culture (routine x 2)     Status: Abnormal (Preliminary result)   Collection Time: 09/08/18  8:12 PM  Result Value Ref Range Status   Specimen Description   Final    BLOOD RIGHT WRIST Performed at [facility] [address] [place], [place] [address]    Special Requests   Final    BOTTLES DRAWN AEROBIC AND ANAEROBIC Blood Culture adequate  volume Performed at [facility] [address] [place], [place] [address]    Culture  Setup Time   Final    GRAM POSITIVE COCCI IN BOTH AEROBIC AND ANAEROBIC BOTTLES CRITICAL RESULT CALLED TO, READ BACK BY AND VERIFIED WITH: Sheffield Slider [facility] 09/09/18 1703 JDW    Culture (A)  Final    STAPHYLOCOCCUS SPECIES (COAGULASE NEGATIVE) CULTURE REINCUBATED FOR BETTER GROWTH Performed at [facility], [place] [address], [place], [place] [address]    Report Status PENDING  Incomplete  Blood Culture ID Panel (Reflexed)     Status: Abnormal   Collection Time: 09/08/18  8:12 PM  Result Value Ref Range Status   Enterococcus species NOT DETECTED NOT DETECTED Final   Listeria monocytogenes NOT DETECTED NOT DETECTED Final   Staphylococcus species DETECTED (A) NOT DETECTED Final    Comment: Methicillin (oxacillin) susceptible coagulase negative staphylococcus. Possible blood culture contaminant (unless isolated from more than one blood culture draw or clinical case suggests pathogenicity). No antibiotic treatment is indicated for blood  culture contaminants. CRITICAL RESULT CALLED TO, READ BACK BY AND VERIFIED WITH: Sheffield Slider [facility] 09/09/18 1703 JDW    Staphylococcus aureus (BCID) NOT DETECTED NOT DETECTED Final   Methicillin resistance NOT DETECTED NOT DETECTED Final   Streptococcus species DETECTED (A) NOT DETECTED Final    Comment: Not Enterococcus species, Streptococcus agalactiae, Streptococcus pyogenes, or Streptococcus pneumoniae. CRITICAL RESULT CALLED TO, READ BACK BY AND VERIFIED WITH: Sheffield Slider [facility] 09/09/18 1703 JDW    Streptococcus agalactiae NOT DETECTED NOT DETECTED Final   Streptococcus pneumoniae NOT DETECTED NOT DETECTED Final   Streptococcus pyogenes NOT DETECTED NOT DETECTED Final   Acinetobacter baumannii NOT DETECTED NOT DETECTED Final   Enterobacteriaceae species NOT DETECTED NOT DETECTED Final   Enterobacter cloacae complex NOT DETECTED NOT  DETECTED Final   Escherichia coli NOT DETECTED NOT DETECTED Final   Klebsiella oxytoca NOT DETECTED NOT DETECTED Final   Klebsiella pneumoniae NOT DETECTED NOT DETECTED Final   Proteus species NOT DETECTED NOT DETECTED Final   Serratia marcescens NOT DETECTED NOT DETECTED Final   Haemophilus influenzae NOT DETECTED NOT DETECTED Final   Neisseria meningitidis NOT DETECTED NOT DETECTED Final   Pseudomonas aeruginosa NOT DETECTED NOT DETECTED Final   Candida albicans NOT DETECTED NOT DETECTED Final   Candida glabrata NOT DETECTED NOT DETECTED Final   Candida krusei NOT DETECTED NOT DETECTED Final   Candida parapsilosis NOT DETECTED NOT DETECTED Final   Candida tropicalis NOT DETECTED NOT DETECTED Final    Comment: Performed at [facility], [place]. [address] [place], [place] [address]  Blood Culture (routine x 2)     Status: None (Preliminary result)   Collection Time: 09/08/18  8:13 PM  Result Value Ref Range Status   Specimen  Description   Final    BLOOD LEFT ARM Performed at [facility] [address], [place], [place] [address]    Special Requests   Final    BOTTLES DRAWN AEROBIC AND ANAEROBIC Blood Culture adequate volume Performed at [facility] [address] [place], [place] [address]    Culture   Final    NO GROWTH 1 DAY Performed at [facility], [place] [address], [place], [place] [address]    Report Status PENDING  Incomplete  Urine culture     Status: Abnormal   Collection Time: 09/08/18  8:13 PM  Result Value Ref Range Status   Specimen Description   Final    URINE, CLEAN CATCH Performed at [facility], [place] [address], [place], [place] [address]    Special Requests   Final    NONE Performed at [facility], [place] [address] [place], [place] [address]    Culture MULTIPLE SPECIES PRESENT, SUGGEST RECOLLECTION (A)  Final   Report Status 09/10/2018 FINAL  Final  MRSA PCR  Screening     Status: None   Collection Time: 09/09/18  1:11 AM  Result Value Ref Range Status   MRSA by PCR NEGATIVE NEGATIVE Final    Comment:        The GeneXpert MRSA Assay (FDA approved for NASAL specimens only), is one component of a comprehensive MRSA colonization surveillance program. It is not intended to diagnose MRSA infection nor to guide or monitor treatment for MRSA infections. Performed at [facility], [place] [address], [place], [place] [address]      Studies: No results found.  Scheduled Meds: . dexamethasone  4 mg Intravenous Q6H  . latanoprost  1 drop Both Eyes QHS  . mouth rinse  15 mL Mouth Rinse BID  . Rivaroxaban  15 mg Oral BID WC    Continuous Infusions: . sodium chloride 10 mL/hr at 09/10/18 1000  . ampicillin (OMNIPEN) IV 2 g (09/11/18 0852)  . cefTRIAXone (ROCEPHIN)  IV Stopped (09/10/18 2330)  . ganciclovir (CYTOVENE) IV Stopped (09/10/18 1057)  . levETIRAcetam 750 mg (09/10/18 2321)  . vancomycin Stopped (09/10/18 0022)     Time spent: 64mins, case discussed with palliative care I have personally reviewed and interpreted on  09/11/2018 daily labs, tele strips, imagings as discussed above under date review session and assessment and plans.  I reviewed all nursing notes, pharmacy notes, consultant notes,  vitals, pertinent old records  I have discussed plan of care as described above with RN , patient on 09/11/2018   Florencia Reasons MD, PhD  [facility] Pager [phone]. If 7PM-7AM, please contact night-coverage at [email or web address], password [facility] 09/11/2018, 9:24 AM  LOS: 2 days

## 2018-09-11 NOTE — Consult Note (Signed)
Consultation Note Date: 09/11/2018   Patient Name: Wyatt Stein  DOB: [birth year]  MRN: [id number]  Age / Sex: 84 y.o., male  PCP: Wenda Low, MD Referring Physician: Florencia Reasons, MD  Reason for Consultation: Establishing goals of care  HPI/Patient Profile: 84 y.o. male  admitted on 09/08/2018     Clinical Assessment and Goals of Care:  84 year old with life limiting illness of metastatic lung cancer to brain, new brain metastases causing altered mental status and recurrent seizures. Also has severe weakness, moderate protein calorie malnutrition, recent DVT, underlying stage III chronic kidney disease.  Patient has had a significant decline in functional status and overall performance status. More recently, hospital course also complicated by altered mental status per patient was seen and evaluated by medical oncology. It was deemed that the patient would not be a candidate for systemic chemotherapy, Port-A-Cath placement was canceled. Patient was maintained on IV steroids and IV antiepileptics. Brain imaging showed several foci of metastatic brain disease.   Palliative consultation for continuation of goals of care discussions, that were initiated by medical oncology colleagues.  Patient is awake but confused resting in bed. He believes that I am his Engineer, manufacturing systems. He states that he got caught driving under the influence. He states that he is not the patient.  I was called to the bedside again after daughter Wyatt Stein arrived at the hospital. Subsequently a family meeting ensued, patient's wife Wyatt Stein also participated over the phone. She was appreciative of all the information she has received from oncology. She was asking about next steps.  We reviewed the serious and incurable nature of the patient's condition. We reviewed about patient likely having limited prognosis. The type of care that can be  provided in a residential hospice setting discussed in detail. Discussed that at residential hospice the focus is not on IV medications, the focus is not on physical therapy, the focuses on comfort measures only. Discussed frankly that the patient might have a prognosis of not more than 2-3 weeks at this point.   Please note additional recommendations as listed below. Thank you for the consult.   NEXT OF KIN  wife, daughter.   SUMMARY OF RECOMMENDATIONS    1. Agree with DO NOT RESUSCITATE/DO NOT INTUBATE.  2. Goals of care and disposition discussions: Discussed with patient, wife over the phone and daughter Wyatt Stein present at the bedside about hospice philosophy of care, more specifically, the type of care that can be provided in a residential hospice setting discussed in detail. Family is aware of [facility] and request consultation with [facility] liaison. Will request social worker to facilitate.   Code Status/Advance Care Planning:  DNR    Symptom Management:    as above   Palliative Prophylaxis:   Delirium Protocol   Psycho-social/Spiritual:   Desire for further Chaplaincy support:yes  Additional Recommendations: Education on Hospice  Prognosis:   < 2 weeks  Discharge Planning: Hospice facility      Primary Diagnoses: Present on Admission: . Fever . Acute encephalopathy .  COPD GOLD 0/ still smoking . Bronchogenic cancer of right lung ([place]) . DVT (deep venous thrombosis) ([place])   I have reviewed the medical record, interviewed the patient and family, and examined the patient. The following aspects are pertinent.  Past Medical History:  Diagnosis Date  . Cancer [facility]) 2008   prostate, sees dr [name] once a year  . COPD (chronic obstructive pulmonary disease) ([place])   . GERD (gastroesophageal reflux disease)   . History of blood transfusion 2006  . Hypertension   . PONV (postoperative nausea and vomiting) 40 yrs ago   aspiration pneumonia after tooth  extraxtion   Social History   Socioeconomic History  . Marital status: Married    Spouse name: Wyatt Stein   . Number of children: 2  . Years of education: College   . Highest education level: Not on file  Occupational History  . Occupation: Retired   Scientific laboratory technician  . Financial resource strain: Not on file  . Food insecurity:    Worry: Not on file    Inability: Not on file  . Transportation needs:    Medical: Not on file    Non-medical: Not on file  Tobacco Use  . Smoking status: Current Every Day Smoker    Packs/day: 0.50    Years: 60.00    Pack years: 30.00    Types: Cigarettes  . Smokeless tobacco: Never Used  Substance and Sexual Activity  . Alcohol use: No    Alcohol/week: 14.0 standard drinks    Types: 14 Shots of liquor per week    Comment: Former--pt states none since Oct  . Drug use: No  . Sexual activity: Yes    Birth control/protection: None  Lifestyle  . Physical activity:    Days per week: Not on file    Minutes per session: Not on file  . Stress: Not on file  Relationships  . Social connections:    Talks on phone: Not on file    Gets together: Not on file    Attends religious service: Not on file    Active member of club or organization: Not on file    Attends meetings of clubs or organizations: Not on file    Relationship status: Not on file  Other Topics Concern  . Not on file  Social History Narrative   Patient lives at home with wife.   Patient is retired.    Patient is a college grad.    Patient has 2 children.    09-02-18 Unable to ask abuse questions wife with him this morning.   Family History  Problem Relation Age of Onset  . Heart Problems Mother   . Colon cancer Father    Scheduled Meds: . dexamethasone  4 mg Intravenous Q6H  . latanoprost  1 drop Both Eyes QHS  . mouth rinse  15 mL Mouth Rinse BID  . rivaroxaban  15 mg Oral BID WC   Followed by  . [START ON 10/02/2018] rivaroxaban  20 mg Oral Q supper   Continuous Infusions: .  sodium chloride 10 mL/hr at 09/10/18 1000  . ampicillin (OMNIPEN) IV 2 g (09/11/18 0852)  . cefTRIAXone (ROCEPHIN)  IV 2 g (09/11/18 1250)  . ganciclovir (CYTOVENE) IV 150 mg (09/11/18 1039)  . levETIRAcetam 750 mg (09/11/18 1105)  . vancomycin 1,250 mg (09/11/18 1254)   PRN Meds:.sodium chloride, acetaminophen **OR** acetaminophen, hydrALAZINE Medications Prior to Admission:  Prior to Admission medications   Medication Sig Start Date End Date Taking?  Authorizing Provider  amLODipine (NORVASC) 5 MG tablet Take 5 mg by mouth daily. 01/18/18  Yes [provider]  benzonatate (TESSALON) 200 MG capsule Take 1 capsule by mouth 3 (three) times daily as needed for cough.  06/19/18  Yes [provider]  cholecalciferol (VITAMIN D) 1000 UNITS tablet Take 1,000 Units by mouth daily.   Yes [provider]  [employer]S B-1 100 MG tablet Take 100 mg by mouth daily. 07/17/15  Yes [provider]  dexamethasone (DECADRON) 4 MG tablet Take 1 tablet (4 mg total) by mouth 4 (four) times daily. Taper per instructions Patient taking differently: Take 2 mg by mouth every other day. Taper per instructions 07/31/18  Yes Hayden Pedro, PA-C  latanoprost (XALATAN) 0.005 % ophthalmic solution Place 1 drop into both eyes at bedtime.  10/20/15  Yes [provider]  levETIRAcetam (KEPPRA) 750 MG tablet Take 1 tablet (750 mg total) by mouth 2 (two) times daily. 08/20/18  Yes Gorsuch, Ni, MD  Magnesium 250 MG TABS Take 250 mg by mouth daily.    Yes [provider]  Multiple Vitamins-Minerals (CENTRUM SILVER ADULT 50+) TABS Take 1 tablet by mouth daily.   Yes [provider]  Rivaroxaban 15 & 20 MG TBPK Take one 15mg  tablet by mouth twice a day with food. On Day 22, switch to one 20mg  tablet once a day with food. 09/02/18  Yes Hayden Pedro, PA-C  vitamin B-12 (CYANOCOBALAMIN) 1000 MCG tablet Take 1,000 mcg by mouth daily.   Yes [provider]    lidocaine-prilocaine (EMLA) cream Apply 1 application topically as needed. 09/03/18   [name], MD  nicotine polacrilex (CVS NICOTINE POLACRILEX) 2 MG gum Take 1 each (2 mg total) by mouth as needed for smoking cessation. Patient not taking: Reported on 09/03/2018 08/06/18   [name], MD  ondansetron (ZOFRAN) 8 MG tablet Take 1 tablet (8 mg total) by mouth every 8 (eight) hours as needed for nausea. Patient not taking: Reported on 09/02/2018 08/06/18   [name], MD  prochlorperazine (COMPAZINE) 10 MG tablet Take 1 tablet (10 mg total) by mouth every 6 (six) hours as needed for nausea or vomiting. Patient not taking: Reported on 09/02/2018 08/06/18   [name], MD   Allergies  Allergen Reactions  . Ace Inhibitors Anaphylaxis and Swelling  . Ramipril Anaphylaxis and Itching   Review of Systems +confused  Physical Exam Awake alert resting in bed Appears frail Has clubbing of digits Shallow regular breath sounds S1-S2 Abdomen soft Has muscle wasting Dry oral mucosa No edema Is confused  Vital Signs: BP 139/86   Pulse 60   Temp 97.6 F (36.4 C) (Oral)   Resp 18   Ht 5\' 7"  (1.702 m)   Wt 60.8 kg   SpO2 99%   BMI 20.99 kg/m  Pain Scale: 0-10   Pain Score: 0-No pain   SpO2: SpO2: 99 % O2 Device:SpO2: 99 % O2 Flow Rate: .   IO: Intake/output summary:   Intake/Output Summary (Last 24 hours) at 09/11/2018 1335 Last data filed at 09/11/2018 1224 Gross per 24 hour  Intake 531.69 ml  Output 975 ml  Net -443.31 ml    LBM: Last BM Date: 09/10/18 Baseline Weight: Weight: 64.4 kg Most recent weight: Weight: 60.8 kg     Palliative Assessment/Data:    PPS 20% Time In:  12 Time Out:  1310 Time Total:  70 min  Greater than 50%  of this time was spent  counseling and coordinating care related to the above assessment and plan.  Signed by: Loistine Chance, MD  [phone]  Please contact Palliative Medicine Team phone at [phone] for questions and concerns.  For  individual provider: See Shea Evans

## 2018-09-12 ENCOUNTER — Ambulatory Visit: Payer: Medicare Other

## 2018-09-12 LAB — GLUCOSE, CAPILLARY
GLUCOSE-CAPILLARY: 96 mg/dL (ref 70–99)
Glucose-Capillary: 111 mg/dL — ABNORMAL HIGH (ref 70–99)
Glucose-Capillary: 99 mg/dL (ref 70–99)
Glucose-Capillary: 103 mg/dL — ABNORMAL HIGH (ref 70–99)

## 2018-09-12 MED ORDER — LIP MEDEX EX OINT
TOPICAL_OINTMENT | CUTANEOUS | Status: AC
  Filled 2018-09-12: qty 7

## 2018-09-12 NOTE — Progress Notes (Signed)
PROGRESS NOTE  Wyatt Stein YQI:[id number] DOB: 08/15/1934 DOA: 09/08/2018 PCP: Wenda Low, MD  HPI/Recap of past 24 hours:  Only oriented to self, talking and following commands, calm, denies pain Very weak, but able to move extremities  Denies headache, no fever, no cough  Assessment/Plan: Principal Problem:   Acute encephalopathy Active Problems:   Seizure ([place])   COPD GOLD 0/ still smoking   Bronchogenic cancer of right lung ([facility])   Fever   DVT (deep venous thrombosis) ([place])   Palliative care by specialist   Metastatic lung cancer to brain -With new brain mets with vasogenic edema causing encephalopathy and recurrent seizures -Mri brain "Four new subcentimeter enhancing lesions in the cerebellum and right lentiform nucleus consistent with metastases. Cerebellar edema without posterior fossa mass effect. -on decadron, keppra and empicall abx with vanc/ampicillin/rocephin/ganciclovir since admission - will continue decadron, keppra, ampicillin, will d/c vanc/rocephin/ganciclovir today -overall very poor prognosis,  appreciate oncology and palliative care input -now DNR, likely residential hospice placement  VIRIDANS STREPTOCOCCUS bacteremia (one out of two blood culture): Continue ampicillin for now No leukocytosis, no fever  Recent DVT -Continue anticoagulation therapy for now based on recent family meeting  CKDIII Renal function at baseline  FTT: poor prognosis  Code Status: DNR  Family Communication: patient   Disposition Plan:  residential hospice when bed is available   Consultants:  Oncology  Palliative care  hospice  Procedures:  none  Antibiotics:  As above   Objective: BP (!) 166/99   Pulse 67   Temp 97.8 F (36.6 C) (Oral)   Resp 18   Ht 5\' 7"  (1.702 m)   Wt 60.8 kg   SpO2 100%   BMI 20.99 kg/m   Intake/Output Summary (Last 24 hours) at 09/12/2018 1834 Last data filed at 09/12/2018 1700 Gross per 24 hour  Intake  936 ml  Output 250 ml  Net 686 ml   Filed Weights   09/08/18 2257 09/09/18 0100  Weight: 64.4 kg 60.8 kg    Exam: Patient is examined daily including today on 09/12/2018, exams remain the same as of yesterday except that has changed    General:  Very weak, only oriented to self  Cardiovascular: RRR  Respiratory: CTABL  Abdomen: Soft/ND/NT, positive BS  Musculoskeletal: No Edema  Neuro: alert, only oriented to self, calm and following commands, moving arms, wiggles toes, not able to lift legs against gravity   Data Reviewed: Basic Metabolic Panel: Recent Labs  Lab 09/08/18 1901 09/09/18 0340 09/10/18 0332  NA 134* 135 138  K 5.8* 3.9 3.9  CL 102 105 107  CO2 22 20* 18*  GLUCOSE 114* 132* 144*  BUN 31* 26* 35*  CREATININE 1.39* 1.22 1.39*  CALCIUM 8.8* 8.3* 8.5*  PHOS  --   --  4.9*   Liver Function Tests: Recent Labs  Lab 09/08/18 1901 09/09/18 0340 09/10/18 0332  AST 50* 22  --   ALT 21 20  --   ALKPHOS 83 76  --   BILITOT 1.7* 0.9  --   PROT 6.2* 5.7*  --   ALBUMIN 3.1* 2.7* 2.7*   Recent Labs  Lab 09/08/18 2013  LIPASE 31   Recent Labs  Lab 09/09/18 0511  AMMONIA 23   CBC: Recent Labs  Lab 09/08/18 1901 09/09/18 0340 09/10/18 0332  WBC 6.8 9.1 7.9  NEUTROABS 5.4 7.8*  --   HGB 12.8* 11.6* 11.8*  HCT 38.5* 35.5* 34.7*  MCV 96.5 97.5 95.1  PLT  227 197 224   Cardiac Enzymes:   No results for input(s): CKTOTAL, CKMB, CKMBINDEX, TROPONINI in the last 168 hours. BNP (last 3 results) No results for input(s): BNP in the last 8760 hours.  ProBNP (last 3 results) No results for input(s): PROBNP in the last 8760 hours.  CBG: Recent Labs  Lab 09/11/18 1828 09/12/18 0112 09/12/18 0606 09/12/18 1146 09/12/18 1728  GLUCAP 108* 111* 99 96 103*    Recent Results (from the past 240 hour(s))  Blood Culture (routine x 2)     Status: Abnormal   Collection Time: 09/08/18  8:12 PM  Result Value Ref Range Status   Specimen Description    Final    BLOOD RIGHT WRIST Performed at [facility] [address], [place], [place] [address]    Special Requests   Final    BOTTLES DRAWN AEROBIC AND ANAEROBIC Blood Culture adequate volume Performed at [facility] [address], [place], [place] [address]    Culture  Setup Time   Final    GRAM POSITIVE COCCI IN BOTH AEROBIC AND ANAEROBIC BOTTLES CRITICAL RESULT CALLED TO, READ BACK BY AND VERIFIED WITH: Sheffield Slider [facility] 09/09/18 1703 JDW    Culture (A)  Final    STAPHYLOCOCCUS SPECIES (COAGULASE NEGATIVE) VIRIDANS STREPTOCOCCUS THE SIGNIFICANCE OF ISOLATING THIS ORGANISM FROM A SINGLE SET OF BLOOD CULTURES WHEN MULTIPLE SETS ARE DRAWN IS UNCERTAIN. PLEASE NOTIFY THE MICROBIOLOGY DEPARTMENT WITHIN ONE WEEK IF SPECIATION AND SENSITIVITIES ARE REQUIRED. Performed at [facility], [place] [address] [place], [place] [address]    Report Status 09/11/2018 FINAL  Final  Blood Culture ID Panel (Reflexed)     Status: Abnormal   Collection Time: 09/08/18  8:12 PM  Result Value Ref Range Status   Enterococcus species NOT DETECTED NOT DETECTED Final   Listeria monocytogenes NOT DETECTED NOT DETECTED Final   Staphylococcus species DETECTED (A) NOT DETECTED Final    Comment: Methicillin (oxacillin) susceptible coagulase negative staphylococcus. Possible blood culture contaminant (unless isolated from more than one blood culture draw or clinical case suggests pathogenicity). No antibiotic treatment is indicated for blood  culture contaminants. CRITICAL RESULT CALLED TO, READ BACK BY AND VERIFIED WITH: Sheffield Slider [facility] 09/09/18 1703 JDW    Staphylococcus aureus (BCID) NOT DETECTED NOT DETECTED Final   Methicillin resistance NOT DETECTED NOT DETECTED Final   Streptococcus species DETECTED (A) NOT DETECTED Final    Comment: Not Enterococcus species, Streptococcus agalactiae, Streptococcus pyogenes, or Streptococcus pneumoniae. CRITICAL RESULT  CALLED TO, READ BACK BY AND VERIFIED WITH: Sheffield Slider [facility] 09/09/18 1703 JDW    Streptococcus agalactiae NOT DETECTED NOT DETECTED Final   Streptococcus pneumoniae NOT DETECTED NOT DETECTED Final   Streptococcus pyogenes NOT DETECTED NOT DETECTED Final   Acinetobacter baumannii NOT DETECTED NOT DETECTED Final   Enterobacteriaceae species NOT DETECTED NOT DETECTED Final   Enterobacter cloacae complex NOT DETECTED NOT DETECTED Final   Escherichia coli NOT DETECTED NOT DETECTED Final   Klebsiella oxytoca NOT DETECTED NOT DETECTED Final   Klebsiella pneumoniae NOT DETECTED NOT DETECTED Final   Proteus species NOT DETECTED NOT DETECTED Final   Serratia marcescens NOT DETECTED NOT DETECTED Final   Haemophilus influenzae NOT DETECTED NOT DETECTED Final   Neisseria meningitidis NOT DETECTED NOT DETECTED Final   Pseudomonas aeruginosa NOT DETECTED NOT DETECTED Final   Candida albicans NOT DETECTED NOT DETECTED Final   Candida glabrata NOT DETECTED NOT DETECTED Final   Candida krusei NOT DETECTED NOT  DETECTED Final   Candida parapsilosis NOT DETECTED NOT DETECTED Final   Candida tropicalis NOT DETECTED NOT DETECTED Final    Comment: Performed at [facility], [place] [address], [place], [place] [address]  Blood Culture (routine x 2)     Status: None (Preliminary result)   Collection Time: 09/08/18  8:13 PM  Result Value Ref Range Status   Specimen Description   Final    BLOOD LEFT ARM Performed at [facility] [address], [place], [place] [address]    Special Requests   Final    BOTTLES DRAWN AEROBIC AND ANAEROBIC Blood Culture adequate volume Performed at [facility] [address], [place], [place] [address]    Culture   Final    NO GROWTH 3 DAYS Performed at [facility], [place] [address], [place], [place] [address]    Report Status PENDING  Incomplete  Urine culture     Status: Abnormal   Collection Time: 09/08/18  8:13 PM    Result Value Ref Range Status   Specimen Description   Final    URINE, CLEAN CATCH Performed at [facility], [place] [address], [place], [place] [address]    Special Requests   Final    NONE Performed at [facility], [place] [address] [place], [place] [address]    Culture MULTIPLE SPECIES PRESENT, SUGGEST RECOLLECTION (A)  Final   Report Status 09/10/2018 FINAL  Final  MRSA PCR Screening     Status: None   Collection Time: 09/09/18  1:11 AM  Result Value Ref Range Status   MRSA by PCR NEGATIVE NEGATIVE Final    Comment:        The GeneXpert MRSA Assay (FDA approved for NASAL specimens only), is one component of a comprehensive MRSA colonization surveillance program. It is not intended to diagnose MRSA infection nor to guide or monitor treatment for MRSA infections. Performed at [facility], [place] [address] [place], [place] [address]      Studies: No results found.  Scheduled Meds: . dexamethasone  4 mg Intravenous Q6H  . latanoprost  1 drop Both Eyes QHS  . lip balm      . mouth rinse  15 mL Mouth Rinse BID  . rivaroxaban  15 mg Oral BID WC   Followed by  . [START ON 10/02/2018] rivaroxaban  20 mg Oral Q supper    Continuous Infusions: . sodium chloride 10 mL/hr at 09/12/18 0600  . ampicillin (OMNIPEN) IV Stopped (09/12/18 1556)  . levETIRAcetam Stopped (09/12/18 1159)     Time spent: 82mins, case discussed with hospice liaison  I have personally reviewed and interpreted on  09/12/2018 daily labs, imagings as discussed above under date review session and assessment and plans.  I reviewed all nursing notes, pharmacy notes, consultant notes,  vitals, pertinent old records  I have discussed plan of care as described above with RN , patient on 09/12/2018   Florencia Reasons MD, PhD  [facility] Pager [phone]. If 7PM-7AM, please contact night-coverage at [email or web address], password [facility] 09/12/2018, 6:34 PM   LOS: 3 days

## 2018-09-12 NOTE — Care Management Important Message (Signed)
Important Message  Patient Details  Name: Wyatt Stein MRN: [id number] Date of Birth: Mar 10, 1934   Medicare Important Message Given:  Yes    Kerin Salen 09/12/2018, 1:44 [place] Message  Patient Details  Name: Wyatt Stein MRN: [id number] Date of Birth: 1934/03/29   Medicare Important Message Given:  Yes    Kerin Salen 09/12/2018, 1:44 PM

## 2018-09-12 NOTE — Progress Notes (Signed)
Wyatt Stein   DOB:February 20, 1934   DX#:[id number]    Assessment & Plan:  Metastatic lung cancer to brain -Not a candidate for systemic chemotherapy due to decline in performance status and altered mental status -Dr. Alvy Bimler met with the family on 09/10/2018 regarding goals of care; after extensive discussion, the family agreed not to pursue any additional work-up related to the metastatic lung cancer, including lumbar puncture -Palliative care met with the family on 09/11/2018, and the family agreed to consider residential hospice; evaluation for [facility] ongoing   New brain metastases causing altered mental status and recurrent seizures -Patient is alert this morning but not oriented to place, date, or situation -Based on the family meeting, the decision was made to continue antiseizure medications and high-dose IV steroids for now  Gram positive bacteremia -Possibly contaminant  -I will defer the management of IV antibiotics to the internal medicine team  Moderate protein calorie malnutrition -Due to overall health.  Nutritional supplement as tolerated  Severe weakness -Multifactorial, likely due to new brain lesions  Recent DVT -Continue anticoagulation therapy for now based on recent family meeting  Goals of care discussion -Dr. Alvy Bimler met with the family on 09/10/2018 regarding goals of care -Given his overall decline in performance status and altered mental status, family was informed that he was not a candidate for chemotherapy  -The patient's wife has some difficulties accepting the poor prognosis transitioning toward comfort care, but agreed not to pursue any further work-up related to the metastatic lung cancer -She would like to continue supportive measures, including anticoagulation, IV antibiotics, high-dose IV steroids and antiseizure medications, until she can gather multiple family members to be with him -Palliative care team met with the patient's family on  09/11/2018, and the family was agreeable to look into residential hospice facility, such as [facility]; appreciate assistance from the palliative care team  Thank you for the consult.  Please do not hesitate contact me if there are any questions.  Tish Men, MD 09/12/2018  1:08 PM   Subjective: Wyatt Stein is awake and alert this morning and lying in bed, but he is not oriented to place, date, or situation.  No family members were present at bedside.  He asked several times for his cell phone number to be transferred to his home. He denies any complaint this morning, such as chest pain, dyspnea, hemoptysis, abdominal pain, nausea, vomiting, or diarrhea.   Oncology History   Squamous cell PD-L1 1%     Brain metastases ([place])   07/13/2018 Initial Diagnosis    Brain metastases ([facility])     Bronchogenic cancer of right lung ([place])   06/21/2018 Imaging    Large RIGHT upper lobe mass 9.6 x 5.5 x 8.4 cm in size consistent with a primary pulmonary neoplasm.  Mass abuts the anterior chest wall, mediastinum and superior RIGHT hilum and demonstrates central necrosis.  Postobstructive pneumonitis versus lymphangitic tumor spread in RIGHT upper lobe.  Underlying pan lobular emphysema.  Tiny scattered nonspecific ground-glass opacities in the lower lobes.  Mediastinal adenopathy as above.  Aortic Atherosclerosis (ICD10-I70.0) and Emphysema (ICD10-J43.9)    07/02/2018 Pathology Results    Endobronchial biopsy, RUL - COMPLETELY NECROTIC TISSUE, SUGGESTIVE OF A NECROTIC TUMOR.    07/02/2018 Procedure    Bronchoscopy Procedure Note  Date of Operation: 07/02/2018   Pre-op Diagnosis: RUL mass  Post-op Diagnosis: same  Surgeon: Christinia Gully  Anesthesia: Monitored Local Anesthesia with Sedation Time Started:  0814 versed 2.5 mg IV/ demerol 25  mg IV  Time Stopped:  0825  Operation: Video Flexible fiberoptic bronchoscopy, diagnostic   Findings: mass prolapsing out of RUL apical  segment completely obst it   Specimen:  BAL rul/ endobronchial bx x 2 plus portion of mass suctioned off   Estimated Blood Loss: min  Complications: none  Indications and History: See updated H and P same date. The risks, benefits, complications, treatment options and expected outcomes were discussed with the patient.  The possibilities of reaction to medication, pulmonary aspiration, perforation of a viscus, bleeding, failure to diagnose a condition and creating a complication requiring transfusion or operation were discussed with the patient who freely signed the consent.    Description of Procedure: The patient was re-examined in the bronchoscopy suite and the site of surgery properly noted/marked.  The patient was identified  and the procedure verified as Flexible Fiberoptic Bronchoscopy.  A Time Out was held and the above information confirmed.   After the induction of topical nasopharyngeal anesthesia, the patient was positioned  and the bronchoscope was passed through the R naris. The vocal cords were visualized and  1% buffered lidocaine 5 ml was topically placed onto the cords. The cords were nl. The scope was then passed into the trachea.  1% buffered lidocaine given topically. Airways inspected bilaterally to the subsegmental level with the following findings:  Airways nl x for mass prolapsing out of RUL apical segment completely obst it    Interventions: BAL rul/ endobronchial bx x 2 RUl     07/12/2018 PET scan    1. Hypermetabolic RIGHT upper lobe mass consists primary bronchogenic carcinoma. 2. Ipsilateral hypermetabolic mediastinal nodal metastasis. 3. Metastatic brain lesion in the RIGHT frontal lobe partially imaged. See comparison head CT from 07/13/2018 4. No supraclavicular adenopathy. 5. No evidence of metastatic disease below the diaphragms.    07/13/2018 Imaging    CT head 16 x 18 mm hyperdense mass right basal ganglia with extensive surrounding edema and 7  mm midline shift. Findings most compatible with hemorrhagic metastatic disease. Patient has a large lung mass which is hypermetabolic on PET scan yesterday compatible with lung carcinoma.  Moderate atrophy and moderate chronic microvascular ischemia     07/13/2018 Imaging    MRI brain Enhancing mass lesion right basal ganglia with extensive surrounding edema. Given history of lung cancer this is consistent with metastatic disease. Second 2 mm enhancing lesion in the right frontal lobe also consistent with metastatic disease. There is mass-effect and 7 mm midline shift to the left due to extensive edema.    07/16/2018 Procedure    Status post CT-guided biopsy of right upper lobe mass. Tissue specimen sent to pathology for complete histopathologic analysis.    07/16/2018 Pathology Results    Lung, biopsy, right upper lobe - NON-SMALL CELL CARCINOMA, SEE COMMENT. Microscopic Comment Immunohistochemistry is focally positive for cytokeratin 5/6 and p63. TTF-1 and Napsin A are negative. The immunoprofile slightly favors a squamous cell carcinoma. There is likely sufficient tissue for additional studies if requested (Block 1A). Dr. Lyndon Code has reviewed the case.    07/19/2018 Cancer Staging    Staging form: Lung, AJCC 8th Edition - Clinical: Stage IV (cT4, cN2, cM1) - Signed by [name], MD on 07/19/2018    09/09/2018 Imaging    MRI brain 1. Four new subcentimeter enhancing lesions in the cerebellum and right lentiform nucleus consistent with metastases. Cerebellar edema without posterior fossa mass effect. 2. Unchanged size of treated anterior right basal ganglia and right frontal  metastases. 3. 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 4. Worsening T2 hyperintensity throughout the right greater than left cerebral white matter compared to the prior MRI. This may reflect a combination of persistent severe right-sided vasogenic edema related to  metastatic disease and underlying chronic white matter disease as well as a superimposed more acute process such as transependymal CSF flow related to the hydrocephalus, infectious encephalitis, and/or post treatment changes.       Objective:  Vitals:   09/12/18 0605 09/12/18 0921  BP: (!) 148/104 (!) 154/90  Pulse: 81 71  Resp:    Temp:  (!) 97.5 F (36.4 C)  SpO2: 99% 99%     Intake/Output Summary (Last 24 hours) at 09/12/2018 1308 Last data filed at 09/12/2018 0600 Gross per 24 hour  Intake 1367.42 ml  Output 250 ml  Net 1117.42 ml    GENERAL:alert, no distress and comfortable SKIN: skin color, texture, turgor are normal, no rashes or significant lesions EYES: normal, Conjunctiva are pink and non-injected, sclera clear OROPHARYNX:no exudate, no erythema and lips, buccal mucosa, and tongue normal  NECK: supple LUNGS: clear to auscultation and percussion with normal breathing effort HEART: regular rate & rhythm and no murmurs and no lower extremity edema ABDOMEN:abdomen soft, non-tender and normal bowel sounds NEURO: alert but not oriented, mildly dysarthric/mumbles   Labs:  Lab Results  Component Value Date   WBC 7.9 09/10/2018   HGB 11.8 (L) 09/10/2018   HCT 34.7 (L) 09/10/2018   MCV 95.1 09/10/2018   PLT 224 09/10/2018   NEUTROABS 7.8 (H) 09/09/2018    Lab Results  Component Value Date   NA 138 09/10/2018   K 3.9 09/10/2018   CL 107 09/10/2018   CO2 18 (L) 09/10/2018

## 2018-09-12 NOTE — Progress Notes (Addendum)
[facility]  Received request from [place] for family interest in [place] 09/11/18. Chart reviewed and met with spouse and daughter 09/11/18 to explain services, answer questions and confirm interest. Family aware no availability at time of visit 09/11/18. Eligibility has been confirmed. Unfortunately still no availability this morning. Have updated CSW and MD. Will continue to follow and update family until disposition determined.   Thank you,  Erling Conte, LCSW [phone]

## 2018-09-12 NOTE — Plan of Care (Signed)
  Problem: Coping: Goal: Level of anxiety will decrease Outcome: Progressing   Problem: Pain Managment: Goal: General experience of comfort will improve Outcome: Progressing   Problem: Safety: Goal: Ability to remain free from injury will improve Outcome: Progressing   Problem: Skin Integrity: Goal: Risk for impaired skin integrity will decrease Outcome: Progressing   Problem: Spiritual Needs Goal: Ability to function at adequate level Outcome: Progressing

## 2018-09-13 ENCOUNTER — Encounter: Payer: Self-pay | Admitting: Radiation Oncology

## 2018-09-13 ENCOUNTER — Ambulatory Visit: Payer: Medicare Other

## 2018-09-13 LAB — GLUCOSE, CAPILLARY
GLUCOSE-CAPILLARY: 112 mg/dL — AB (ref 70–99)
GLUCOSE-CAPILLARY: 73 mg/dL (ref 70–99)

## 2018-09-13 NOTE — Progress Notes (Signed)
  Radiation Oncology         [phone]) [phone] ________________________________  Name: Wyatt Stein MRN: [id number]  Date: 09/13/2018  DOB: 04-Apr-1934  End of Treatment Note  Diagnosis:   Brain metastasis     Indication for treatment:  palliative       Radiation treatment date:   07/30/2018  Site/dose:  Brain received 20.00 Gy in 1 fractions using 6x-fff photons with a IMRT technique PTV1 and PTV2 targets were treated using 6 Arcs to a prescription dose of 18 Gy for PTV1 and 20 Gy to PTV2.   Narrative: The patient tolerated radiation treatment well.   There were no signs of acute toxicity after treatment.  Plan: The patient has completed radiation treatment. The patient will return to radiation oncology clinic for routine followup in one month. I advised the patient to call or return sooner if they have any questions or concerns related to their recovery or treatment. ________________________________  ------------------------------------------------  Jodelle Gross, MD, PhD   This document serves as a record of services personally performed by Kyung Rudd, MD. It was created on his behalf by Wilburn Mylar, a trained medical scribe. The creation of this record is based on the scribe's personal observations and the provider's statements to them. This document has been checked and approved by the attending provider.

## 2018-09-13 NOTE — Progress Notes (Addendum)
Patient able to transport to [facility] today at 2:30pm. Nursing made aware. [facility] called. Discharge summary faxed to [phone]  Needs DNR signed on chart.  RN CALL FOR REPORT: [place], [place] Social Worker [phone]

## 2018-09-13 NOTE — Progress Notes (Signed)
CSW received call from Erling Conte at [facility]. [facility] reports there is an available bed for the patient today, Harmon Pier to meet with family to complete paperwork.  CSW following for discharge needs.  -Stephanie Acre, [place] Social Worker [phone]

## 2018-09-13 NOTE — Discharge Summary (Signed)
Discharge Summary  Wyatt Stein ZOX:[id number] DOB: Aug 01, 1934  PCP: Wenda Low, MD  Admit date: 09/08/2018 Discharge date: 09/13/2018  Time spent: 51mins, more than 50% time spent on coordination of care.  Discharge to residential hospice  Discharge Diagnoses:  Active Hospital Problems   Diagnosis Date Noted  . Acute encephalopathy 09/08/2018  . Palliative care by specialist   . Fever 09/08/2018  . DVT (deep venous thrombosis) ([place]) 09/08/2018  . Bronchogenic cancer of right lung ([place]) 07/19/2018  . COPD GOLD 0/ still smoking 08/27/2015  . Seizure [facility])     Resolved Hospital Problems  No resolved problems to display.    Discharge Condition: stable  Diet recommendation: comfort feeds  Filed Weights   09/08/18 2257 09/09/18 0100  Weight: 64.4 kg 60.8 kg    History of present illness: (per admitting MD Dr Hal Hope) PCP: Wenda Low, MD  Patient coming from: Home.  Chief Complaint: Increasing confusion and fever.  History obtained from patient's wife.  Patient is encephalopathic.  HPI: Wyatt Stein is a 84 y.o. 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  At that time patient also was noticed to have some seizure as per the patient's wife.  Patient was discharged home and since then as per the patient wife has been gradually progressively getting more weak less ambulatory and getting lethargic.  Since patient became more febrile patient was brought to the ER.  Per patient's wife patient did take his medications this morning and did try to eat something.  Has not had any diarrhea.  ED Course: In the ER patient is found to be lethargic.  CT head was showing normal metastasis with edema and midline shift nothing new.  Chest x-ray and UA were  unremarkable.  She had a temperature of 100.1 in the ER nurse tachycardic.  And on my exam patient is minimally responsive at times responding to his name but episodic.  Pupils are reacting to light.  Abdomen appears soft.  Chest has some wheezes.  Hospital Course:  Principal Problem:   Acute encephalopathy Active Problems:   Seizure ([place])   COPD GOLD 0/ still smoking   Bronchogenic cancer of right lung (H[facility])   Fever   DVT (deep venous thrombosis) (H[facility])   Palliative care by specialist   Metastatic lung cancer to brain -With new brain mets with vasogenic edema causing encephalopathy and recurrent seizures -Mri brain "Four new subcentimeter enhancing lesions in the cerebellum and right lentiform nucleus consistent with metastases. Cerebellar edema without posterior fossa mass effect. -on decadron, keppra and empicall abx with vanc/ampicillin/rocephin/ganciclovir since admission - will continue decadron, keppra, ampicillin, will d/c vanc/rocephin/ganciclovir today -overall very poor prognosis,  appreciate oncology and palliative care input -now DNR,  residential hospice placement  VIRIDANS STREPTOCOCCUS bacteremia (one out of two blood culture): He received broad spectrum abx in the hospital No leukocytosis, no fever residential hospice placement  Recent DVT -on anticoagulation therapyIn the hospital -residential hospice placement   CKDIII Renal function at baseline  FTT: poor prognosis  Code Status: DNR  Family Communication: patient and daughter at bedside  Disposition Plan:  residential hospice    Consultants:  Oncology  Palliative care  hospice  Procedures:  none  Antibiotics:  As above     Discharge Exam: BP (!) 156/98  Pulse 73   Temp 98.8 F (37.1 C)   Resp 20   Ht 5\' 7"  (1.702 m)   Wt 60.8 kg   SpO2 100%   BMI 20.99 kg/m   General: appear comfortable, denies pain    Discharge Instructions    Diet general   Complete  by:  As directed    Diet as tolerated   Increase activity slowly   Complete by:  As directed      Allergies as of 09/13/2018      Reactions   Ace Inhibitors Anaphylaxis, Swelling   Ramipril Anaphylaxis, Itching      Medication List    STOP taking these medications   amLODipine 5 MG tablet Commonly known as:  NORVASC   benzonatate 200 MG capsule Commonly known as:  TESSALON   CENTRUM SILVER ADULT 50+ Tabs   cholecalciferol 1000 units tablet Commonly known as:  VITAMIN D   [employer] B-1 100 MG tablet Generic drug:  thiamine   dexamethasone 4 MG tablet Commonly known as:  DECADRON   latanoprost 0.005 % ophthalmic solution Commonly known as:  XALATAN   levETIRAcetam 750 MG tablet Commonly known as:  KEPPRA   lidocaine-prilocaine cream Commonly known as:  EMLA   Magnesium 250 MG Tabs   nicotine polacrilex 2 MG gum Commonly known as:  NICORETTE   ondansetron 8 MG tablet Commonly known as:  ZOFRAN   prochlorperazine 10 MG tablet Commonly known as:  COMPAZINE   Rivaroxaban 15 & 20 MG Tbpk   vitamin B-12 1000 MCG tablet Commonly known as:  CYANOCOBALAMIN      Allergies  Allergen Reactions  . Ace Inhibitors Anaphylaxis and Swelling  . Ramipril Anaphylaxis and Itching      The results of significant diagnostics from this hospitalization (including imaging, microbiology, ancillary and laboratory) are listed below for reference.    Significant Diagnostic Studies: Ct Head Wo Contrast  Result Date: 09/08/2018 CLINICAL DATA:  History of lung cancer post radiation two weeks ago. Weakness past 5 days with fever. Known brain metastases. EXAM: CT HEAD WITHOUT CONTRAST TECHNIQUE: Contiguous axial images were obtained from the base of the skull through the vertex without intravenous contrast. COMPARISON:  09/03/2018 and 07/31/2018 FINDINGS: Brain: Examination demonstrates evidence patient's known right frontal brain metastasis measuring approximately 2.4 cm in diameter  with moderate surrounding edema and mass effect without significant change. There is subtle midline shift to the left of 5 mm without significant change. Chronic ischemic microvascular disease is present. No new findings. Vascular: No hyperdense vessel or unexpected calcification. Skull: Normal. Negative for fracture or focal lesion. Sinuses/Orbits: Orbits are within normal. Minimal mucosal membrane thickening over the right maxillary sinus. Other: None. IMPRESSION: Known 2.4 cm right frontal metastasis with surrounding edema and mild mass effect with midline shift to the left of 5 mm as these findings are unchanged. Chronic ischemic microvascular disease. Minimal chronic sinus inflammatory change. Electronically Signed   By: Marin Olp M.D.   On: 09/08/2018 21:39   Ct Head Wo Contrast  Result Date: 09/03/2018 CLINICAL DATA:  84 y/o M; completed radiation of the brain 08/23/2018 for metastatic lung cancer. Found on floor. Weakness. EXAM: CT HEAD WITHOUT CONTRAST TECHNIQUE: Contiguous axial images were obtained from the base of the skull through the vertex without intravenous contrast. COMPARISON:  07/31/2018 CT head. 07/24/2018 MRI head. FINDINGS: Brain: Decreased size and attenuation of mass within the right anterior basal ganglia measuring up to 21 mm, previously 27 mm on prior CT  of head. Stable large surrounding region of hypoattenuation in right frontal and temporal lobes as well as the basal ganglia compatible with edema. Decreased associated mass effect with partial effacement of the right lateral ventricle and 6 mm right-to-left midline shift. Stable subcentimeter left frontal region meningioma. Stable background of chronic microvascular ischemic changes and volume loss of the brain. No new stroke, hemorrhage, or focus of mass effect identified. Vascular: Calcific atherosclerosis of carotid siphons. No hyperdense vessel identified. Skull: Normal. Negative for fracture or focal lesion. Sinuses/Orbits:  Small right maxillary sinus fluid level. Additional visible paranasal sinuses and the mastoid air cells are normally aerated. Orbits are unremarkable. Other: None. IMPRESSION: 1. Decreased size and attenuation of mass within the right anterior basal ganglia in comparison with prior CT of head. 2. Stable distribution of surrounding edema in right frontal and temporal lobes as well as basal ganglia. 3. Mildly decreased mass effect with persistent partial effacement of right lateral ventricle and 6 mm right-to-left midline shift. 4. No new acute intracranial abnormality identified. Electronically Signed   By: Kristine Garbe M.D.   On: 09/03/2018 15:49   Mr Jeri Cos VE Contrast  Result Date: 09/09/2018 CLINICAL DATA:  Non-small cell lung cancer with brain metastases treated with SRS on 07/30/2018. Increasing confusion. EXAM: MRI HEAD WITHOUT AND WITH CONTRAST TECHNIQUE: Multiplanar, multiecho pulse sequences of the brain and surrounding structures were obtained without and with intravenous contrast. CONTRAST:  6 mL Gadavist COMPARISON:  Head CT 09/08/2018 and MRI 07/24/2018 FINDINGS: Some sequences are mildly to moderately motion degraded. Brain: There is no evidence of acute infarct, intracranial hemorrhage, or extra-axial fluid collection. There is persistent extensive vasogenic edema throughout the predominantly anterior aspect of the right cerebral hemisphere as previously seen. Extensive, confluent T2 hyperintensity throughout the white matter of the more posterior right cerebral hemisphere as well as throughout the left cerebral hemisphere has progressed from the prior MRI. Leftward midline shift measures 7 mm, similar to the prior MRI. There is persistent mass effect on the right lateral ventricle, however compared to the prior MRI there is new moderate dilatation of the left lateral, third, and fourth ventricles including of the left temporal horn. Heterogeneously enhancing mass anteriorly in the  right basal ganglia is similar in size to the prior MRI measuring 3.1 x 2.4 x 2.3 cm (series 11, image 29, previously 2.9 x 2.3 x 2.3 cm upon remeasurement). A 2 mm enhancing lesion located more anteriorly in the right frontal lobe is unchanged (series 11, image 28). A 7 mm enhancing lesion in the posterior right lentiform nucleus is new (series 11, image 31). There are 2 new enhancing lesions in the left cerebellar hemisphere each measuring 7 mm (series 11, images 15 and 16), and there is a new 3 mm enhancing lesion in the cerebellar vermis (series 11, image 18). There is new edema throughout the left cerebellar hemisphere extending into the vermis without mass effect. A 9 mm enhancing dural-based mass over the left frontal convexity is unchanged and is without associated mass effect. Vascular: Major intracranial vascular flow voids are preserved. Skull and upper cervical spine: Unremarkable bone marrow signal. Sinuses/Orbits: Unremarkable orbits. Mild right ethmoid and right maxillary sinus mucosal thickening. No significant mastoid fluid. Other: None. IMPRESSION: 1. Four new subcentimeter enhancing lesions in the cerebellum and right lentiform nucleus consistent with metastases. Cerebellar edema without posterior fossa mass effect. 2. Unchanged size of treated anterior right basal ganglia and right frontal metastases. 3. Moderate ventriculomegaly, new/progressive from the prior  MRI suggesting communicating hydrocephalus. 4. Worsening T2 hyperintensity throughout the right greater than left cerebral white matter compared to the prior MRI. This may reflect a combination of persistent severe right-sided vasogenic edema related to metastatic disease and underlying chronic white matter disease as well as a superimposed more acute process such as transependymal CSF flow related to the hydrocephalus, infectious encephalitis, and/or post treatment changes. Electronically Signed   By: Logan Bores M.D.   On: 09/09/2018  13:24   Dg Chest Port 1 View  Result Date: 09/08/2018 CLINICAL DATA:  History of lung cancer with last radiation treatment 2 weeks ago. Weakness over the past 5 days with new onset fever. Possible aspiration. EXAM: PORTABLE CHEST 1 VIEW COMPARISON:  07/16/2018 as well as PET-CT 07/12/2018 FINDINGS: Lungs are adequately inflated demonstrate mild interval decrease in size of known 7 cm right upper lobe lung cancer. Remainder of the lungs are clear. Cardiomediastinal silhouette and remainder of the exam is unchanged. IMPRESSION: No acute findings. Slight interval decrease in size of known 7 cm right upper lobe lung cancer. Electronically Signed   By: Marin Olp M.D.   On: 09/08/2018 20:01   Vas [place] Lower Extremity Venous (dvt)  Result Date: 09/02/2018  Lower Venous Study Indications: Swelling.  Performing Technologist: Oliver Hum RVT  Examination Guidelines: A complete evaluation includes B-mode imaging, spectral Doppler, color Doppler, and power Doppler as needed of all accessible portions of each vessel. Bilateral testing is considered an integral part of a complete examination. Limited examinations for reoccurring indications may be performed as noted.  Right Venous Findings: +-------------------+---------------+---------+-----------+----------+-------+                    CompressibilityPhasicitySpontaneityPropertiesSummary +-------------------+---------------+---------+-----------+----------+-------+ CFV                Full           Yes      Yes                          +-------------------+---------------+---------+-----------+----------+-------+ SFJ                Full                                                 +-------------------+---------------+---------+-----------+----------+-------+ FV Prox            Full                                                 +-------------------+---------------+---------+-----------+----------+-------+ FV Mid             None                                          Acute   +-------------------+---------------+---------+-----------+----------+-------+ FV Distal          None                                         Acute   +-------------------+---------------+---------+-----------+----------+-------+ PFV  Full                                                 +-------------------+---------------+---------+-----------+----------+-------+ POP                None           No       No                   Acute   +-------------------+---------------+---------+-----------+----------+-------+ PTV                Full                                                 +-------------------+---------------+---------+-----------+----------+-------+ PERO               Full                                                 +-------------------+---------------+---------+-----------+----------+-------+ Tibioperoneal trunkPartial                                      Acute   +-------------------+---------------+---------+-----------+----------+-------+  Left Venous Findings: +---+---------------+---------+-----------+----------+-------+    CompressibilityPhasicitySpontaneityPropertiesSummary +---+---------------+---------+-----------+----------+-------+ CFVFull           Yes      Yes                          +---+---------------+---------+-----------+----------+-------+    Summary: Right: Findings consistent with acute deep vein thrombosis involving the right femoral vein, and right popliteal vein. No cystic structure found in the popliteal fossa. Left: No evidence of common femoral vein obstruction.  *See table(s) above for measurements and observations. Electronically signed by Ruta Hinds MD on 09/02/2018 at 8:39:40 PM.    Final     Microbiology: Recent Results (from the past 240 hour(s))  Blood Culture (routine x 2)     Status: Abnormal   Collection Time: 09/08/18  8:12 PM    Result Value Ref Range Status   Specimen Description   Final    BLOOD RIGHT WRIST Performed at [facility], [place] [address] [place], [place] [address]    Special Requests   Final    BOTTLES DRAWN AEROBIC AND ANAEROBIC Blood Culture adequate volume Performed at [place] [address], [place], [place] [address]    Culture  Setup Time   Final    GRAM POSITIVE COCCI IN BOTH AEROBIC AND ANAEROBIC BOTTLES CRITICAL RESULT CALLED TO, READ BACK BY AND VERIFIED WITH: Sheffield Slider [facility] 09/09/18 1703 JDW    Culture (A)  Final    STAPHYLOCOCCUS SPECIES (COAGULASE NEGATIVE) VIRIDANS STREPTOCOCCUS THE SIGNIFICANCE OF ISOLATING THIS ORGANISM FROM A SINGLE SET OF BLOOD CULTURES WHEN MULTIPLE SETS ARE DRAWN IS UNCERTAIN. PLEASE NOTIFY THE MICROBIOLOGY DEPARTMENT WITHIN ONE WEEK IF SPECIATION AND SENSITIVITIES ARE REQUIRED. Performed at [facility], [place] [address], [place], [place] [address]    Report Status  09/11/2018 FINAL  Final  Blood Culture ID Panel (Reflexed)     Status: Abnormal   Collection Time: 09/08/18  8:12 PM  Result Value Ref Range Status   Enterococcus species NOT DETECTED NOT DETECTED Final   Listeria monocytogenes NOT DETECTED NOT DETECTED Final   Staphylococcus species DETECTED (A) NOT DETECTED Final    Comment: Methicillin (oxacillin) susceptible coagulase negative staphylococcus. Possible blood culture contaminant (unless isolated from more than one blood culture draw or clinical case suggests pathogenicity). No antibiotic treatment is indicated for blood  culture contaminants. CRITICAL RESULT CALLED TO, READ BACK BY AND VERIFIED WITH: Sheffield Slider [facility] 09/09/18 1703 JDW    Staphylococcus aureus (BCID) NOT DETECTED NOT DETECTED Final   Methicillin resistance NOT DETECTED NOT DETECTED Final   Streptococcus species DETECTED (A) NOT DETECTED Final    Comment: Not Enterococcus species, Streptococcus agalactiae, Streptococcus  pyogenes, or Streptococcus pneumoniae. CRITICAL RESULT CALLED TO, READ BACK BY AND VERIFIED WITH: Sheffield Slider [facility] 09/09/18 1703 JDW    Streptococcus agalactiae NOT DETECTED NOT DETECTED Final   Streptococcus pneumoniae NOT DETECTED NOT DETECTED Final   Streptococcus pyogenes NOT DETECTED NOT DETECTED Final   Acinetobacter baumannii NOT DETECTED NOT DETECTED Final   Enterobacteriaceae species NOT DETECTED NOT DETECTED Final   Enterobacter cloacae complex NOT DETECTED NOT DETECTED Final   Escherichia coli NOT DETECTED NOT DETECTED Final   Klebsiella oxytoca NOT DETECTED NOT DETECTED Final   Klebsiella pneumoniae NOT DETECTED NOT DETECTED Final   Proteus species NOT DETECTED NOT DETECTED Final   Serratia marcescens NOT DETECTED NOT DETECTED Final   Haemophilus influenzae NOT DETECTED NOT DETECTED Final   Neisseria meningitidis NOT DETECTED NOT DETECTED Final   Pseudomonas aeruginosa NOT DETECTED NOT DETECTED Final   Candida albicans NOT DETECTED NOT DETECTED Final   Candida glabrata NOT DETECTED NOT DETECTED Final   Candida krusei NOT DETECTED NOT DETECTED Final   Candida parapsilosis NOT DETECTED NOT DETECTED Final   Candida tropicalis NOT DETECTED NOT DETECTED Final    Comment: Performed at [facility], [place]. [address], [place], [place] [address]  Blood Culture (routine x 2)     Status: None (Preliminary result)   Collection Time: 09/08/18  8:13 PM  Result Value Ref Range Status   Specimen Description   Final    BLOOD LEFT ARM Performed at [place] [address] [place], [place] [address]    Special Requests   Final    BOTTLES DRAWN AEROBIC AND ANAEROBIC Blood Culture adequate volume Performed at [place] [address], [place], [place] [address]    Culture   Final    NO GROWTH 3 DAYS Performed at [facility], [place] [address], [place], [place] [address]    Report Status PENDING  Incomplete  Urine culture      Status: Abnormal   Collection Time: 09/08/18  8:13 PM  Result Value Ref Range Status   Specimen Description   Final    URINE, CLEAN CATCH Performed at [facility], [place] [address] [place], [place] [address]    Special Requests   Final    NONE Performed at [facility], [place] [address] [place], [place] [address]    Culture MULTIPLE SPECIES PRESENT, SUGGEST RECOLLECTION (A)  Final   Report Status 09/10/2018 FINAL  Final  MRSA PCR Screening     Status: None   Collection Time: 09/09/18  1:11 AM  Result Value Ref  Range Status   MRSA by PCR NEGATIVE NEGATIVE Final    Comment:        The GeneXpert MRSA Assay (FDA approved for NASAL specimens only), is one component of a comprehensive MRSA colonization surveillance program. It is not intended to diagnose MRSA infection nor to guide or monitor treatment for MRSA infections. Performed at [facility], [place] [address], [place], [place] [address]      Labs: Basic Metabolic Panel: Recent Labs  Lab 09/08/18 1901 09/09/18 0340 09/10/18 0332  NA 134* 135 138  K 5.8* 3.9 3.9  CL 102 105 107  CO2 22 20* 18*  GLUCOSE 114* 132* 144*  BUN 31* 26* 35*  CREATININE 1.39* 1.22 1.39*  CALCIUM 8.8* 8.3* 8.5*  PHOS  --   --  4.9*   Liver Function Tests: Recent Labs  Lab 09/08/18 1901 09/09/18 0340 09/10/18 0332  AST 50* 22  --   ALT 21 20  --   ALKPHOS 83 76  --   BILITOT 1.7* 0.9  --   PROT 6.2* 5.7*  --   ALBUMIN 3.1* 2.7* 2.7*   Recent Labs  Lab 09/08/18 2013  LIPASE 31   Recent Labs  Lab 09/09/18 0511  AMMONIA 23   CBC: Recent Labs  Lab 09/08/18 1901 09/09/18 0340 09/10/18 0332  WBC 6.8 9.1 7.9  NEUTROABS 5.4 7.8*  --   HGB 12.8* 11.6* 11.8*  HCT 38.5* 35.5* 34.7*  MCV 96.5 97.5 95.1  PLT 227 197 224   Cardiac Enzymes: No results for input(s): CKTOTAL, CKMB, CKMBINDEX, TROPONINI in the last 168 hours. BNP: BNP (last 3 results) No results for  input(s): BNP in the last 8760 hours.  ProBNP (last 3 results) No results for input(s): PROBNP in the last 8760 hours.  CBG: Recent Labs  Lab 09/12/18 0112 09/12/18 0606 09/12/18 1146 09/12/18 1728 09/13/18 0035  GLUCAP 111* 99 96 103* 112*       Signed:  Florencia Reasons MD, PhD  [facility] 09/13/2018, 10:38 AM

## 2018-09-13 NOTE — Progress Notes (Signed)
Report was given at 1306 to receiving RN, Nira Conn at [facility].

## 2018-09-14 LAB — CULTURE, BLOOD (ROUTINE X 2)
Culture: NO GROWTH
SPECIAL REQUESTS: ADEQUATE

## 2018-09-16 ENCOUNTER — Other Ambulatory Visit: Payer: Medicare Other

## 2018-09-16 ENCOUNTER — Ambulatory Visit: Payer: Medicare Other

## 2018-09-16 ENCOUNTER — Ambulatory Visit: Payer: Medicare Other | Admitting: Hematology and Oncology

## 2018-09-17 ENCOUNTER — Inpatient Hospital Stay: Payer: Medicare Other

## 2018-09-17 ENCOUNTER — Ambulatory Visit: Payer: Medicare Other

## 2018-09-17 ENCOUNTER — Inpatient Hospital Stay: Payer: Medicare Other | Admitting: Hematology and Oncology

## 2018-09-18 ENCOUNTER — Ambulatory Visit: Payer: Medicare Other

## 2018-09-19 ENCOUNTER — Ambulatory Visit: Payer: Medicare Other

## 2018-09-20 ENCOUNTER — Ambulatory Visit: Payer: Medicare Other

## 2018-09-24 ENCOUNTER — Ambulatory Visit: Payer: Medicare Other

## 2018-09-24 ENCOUNTER — Other Ambulatory Visit: Payer: Medicare Other

## 2018-09-24 ENCOUNTER — Ambulatory Visit: Payer: Medicare Other | Admitting: Hematology and Oncology

## 2018-09-25 NOTE — Progress Notes (Signed)
  Radiation Oncology         [phone] ________________________________  Name: KAYNAN KLONOWSKI MRN: [id number]  Date: 07/30/2018  DOB: Mar 25, 1934  SIMULATION AND TREATMENT PLANNING NOTE  DIAGNOSIS:     ICD-10-CM   1. Bronchogenic cancer of right lung (HCC) C34.91   2. Brain metastases [facility]) C79.31      Site:  chest  NARRATIVE:  The patient was brought to the [place].  Identity was confirmed.  All relevant records and images related to the planned course of therapy were reviewed.   Written consent to proceed with treatment was confirmed which was freely given after reviewing the details related to the planned course of therapy had been reviewed with the patient.  Then, the patient was set-up in a stable reproducible  supine position for radiation therapy.  CT images were obtained.  Surface markings were placed.    Medically necessary complex treatment device(s) for immobilization:  Customized vac-lock bag.   The CT images were loaded into the planning software.  Then the target and avoidance structures were contoured.  Treatment planning then occurred.  The radiation prescription was entered and confirmed.  A total of 3 complex treatment devices were fabricated which relate to the designed radiation treatment fields. Additional reduced fields will be used as necessary to improve the dose homogeneity of the plan. Each of these customized fields/ complex treatment devices will be used on a daily basis during the radiation course. I have requested : 3D Simulation  I have requested a DVH of the following structures: target volume, spinal cord, lungs, heart.   The patient will undergo daily image guidance to ensure accurate localization of the target, and adequate minimize dose to the normal surrounding structures in close proximity to the target.   PLAN:  The patient will receive 30 Gy in 10 fractions.    ________________________________   Jodelle Gross, MD, PhD

## 2018-10-08 ENCOUNTER — Other Ambulatory Visit: Payer: Medicare Other

## 2018-10-08 ENCOUNTER — Ambulatory Visit: Payer: Medicare Other

## 2018-10-08 ENCOUNTER — Ambulatory Visit: Payer: Medicare Other | Admitting: Hematology and Oncology

## 2018-10-13 DEATH — deceased

## 2018-10-15 ENCOUNTER — Other Ambulatory Visit: Payer: Medicare Other

## 2018-10-15 ENCOUNTER — Ambulatory Visit: Payer: Medicare Other

## 2018-10-15 ENCOUNTER — Ambulatory Visit: Payer: Medicare Other | Admitting: Hematology and Oncology

## 2018-10-25 ENCOUNTER — Encounter: Payer: Self-pay | Admitting: Hematology and Oncology

## 2019-11-09 IMAGING — CT CT HEAD W/O CM
3 series · 15 of 47 positions shown, 18 images · non-contrast
Comparison: 07/31/2018 CT head. 07/24/2018 MRI head.

CLINICAL DATA: 84 y/o M; completed radiation of the brain
08/23/2018 for metastatic lung cancer. Found on floor. Weakness.

EXAM:
CT HEAD WITHOUT CONTRAST
TECHNIQUE: Contiguous axial images were obtained from the base of the skull
through the vertex without intravenous contrast.

[Series 2: head wo · axial · 0.51mm/px · z∈[+1411,+1566]mm · 9 of 37 slices shown, 12 images]
[im 3/37  brain]
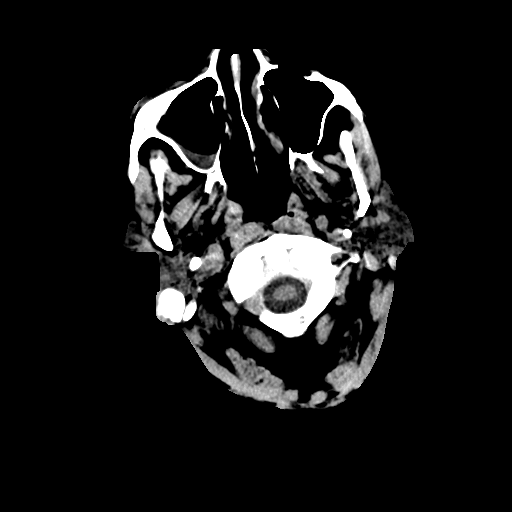
[im 3/37  bone]
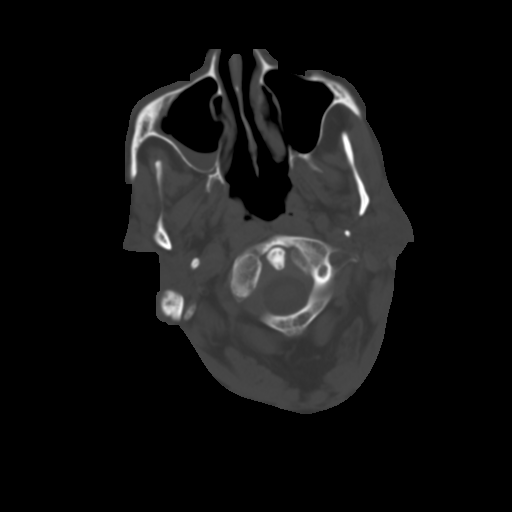
[im 7/37  brain]
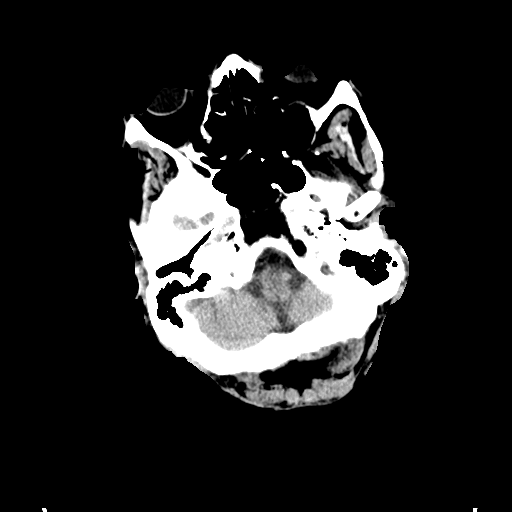
[im 10/37  brain]
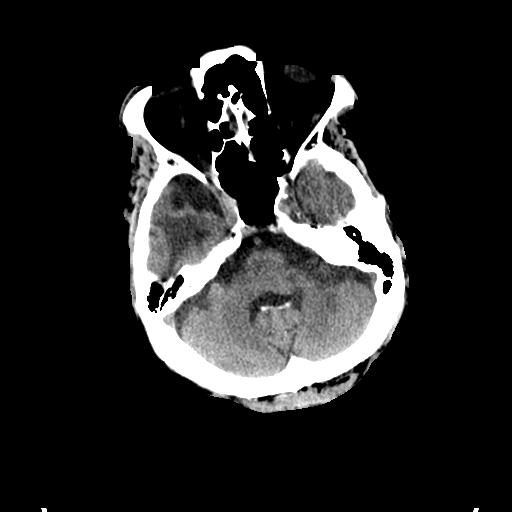
[im 14/37  brain]
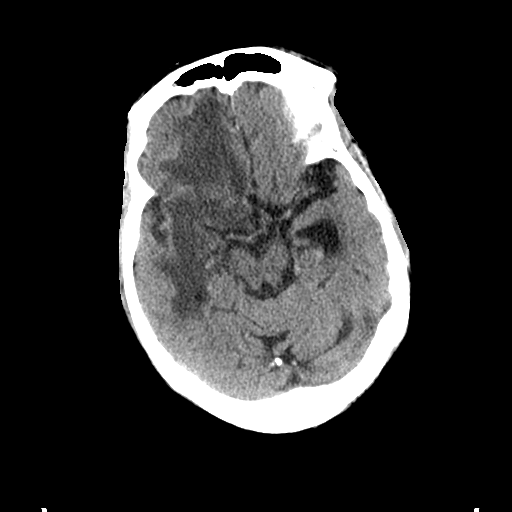
[im 19/37  brain]
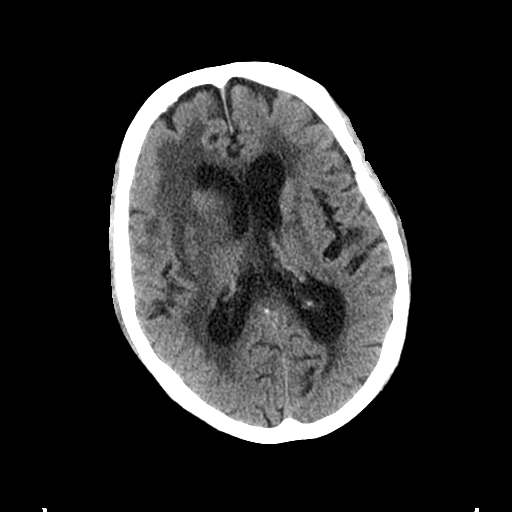
[im 19/37  bone]
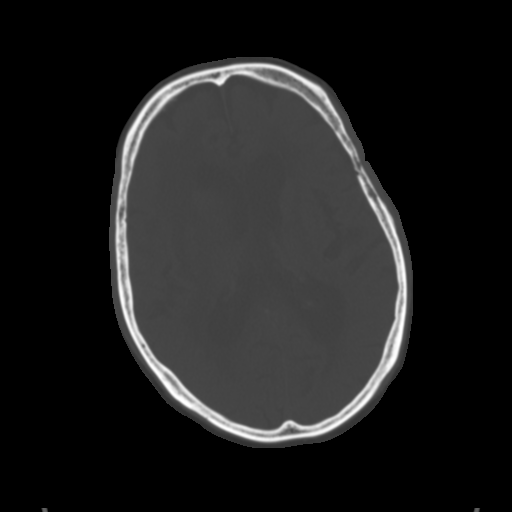
[im 23/37  brain]
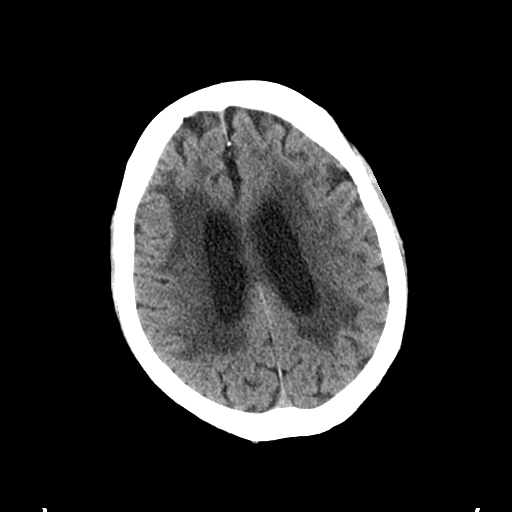
[im 27/37  brain]
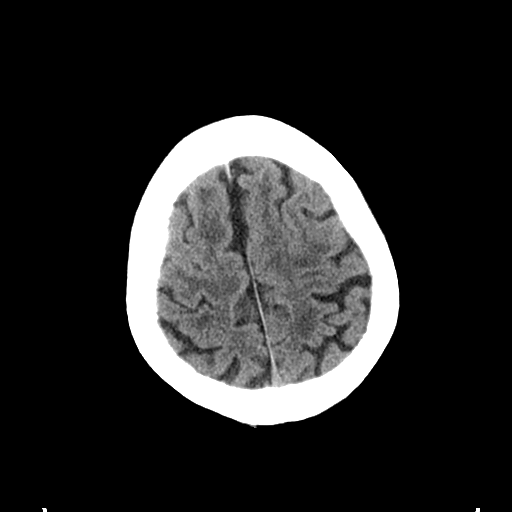
[im 30/37  brain]
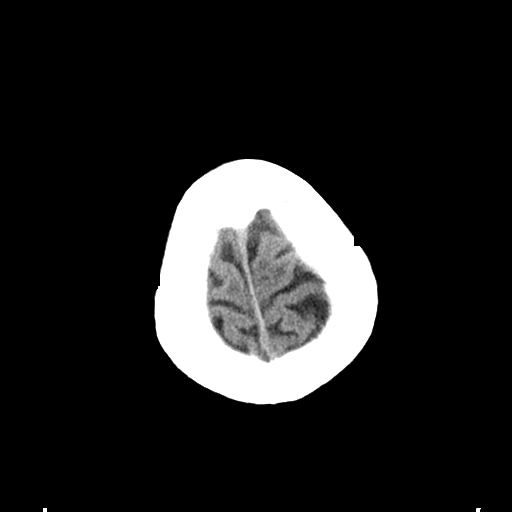
[im 34/37  brain]
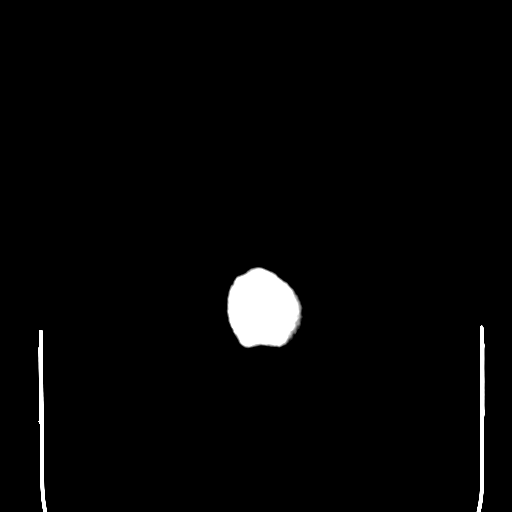
[im 34/37  bone]
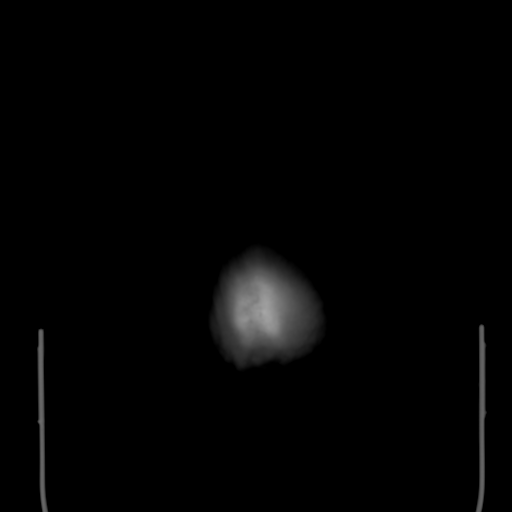

[Series 4: coronal soft tissue · coronal · 0.36mm/px · 3 of 83 slices shown]
[im 28/83  brain]
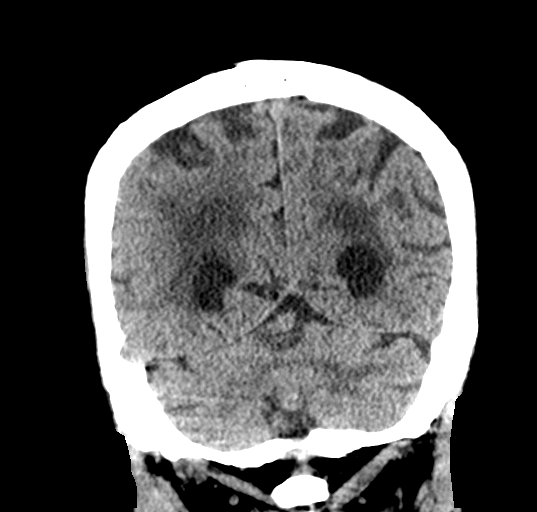
[im 37/83  brain]
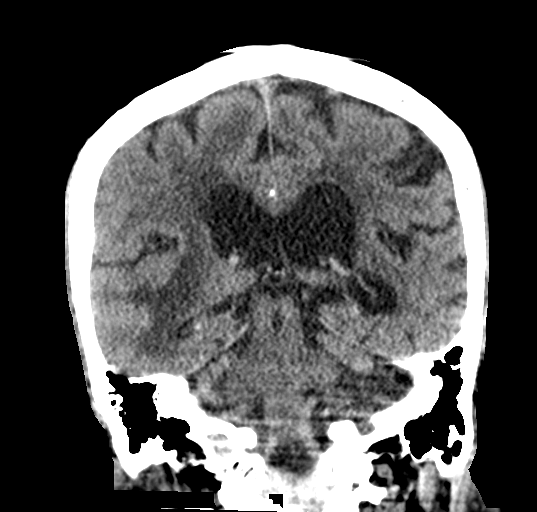
[im 46/83  brain]
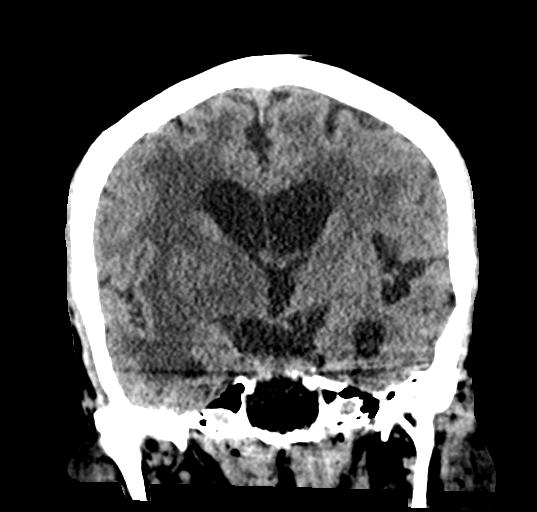

[Series 5: sagittal soft tissue · sagittal · 0.37mm/px · 3 of 62 slices shown]
[im 21/62  brain]
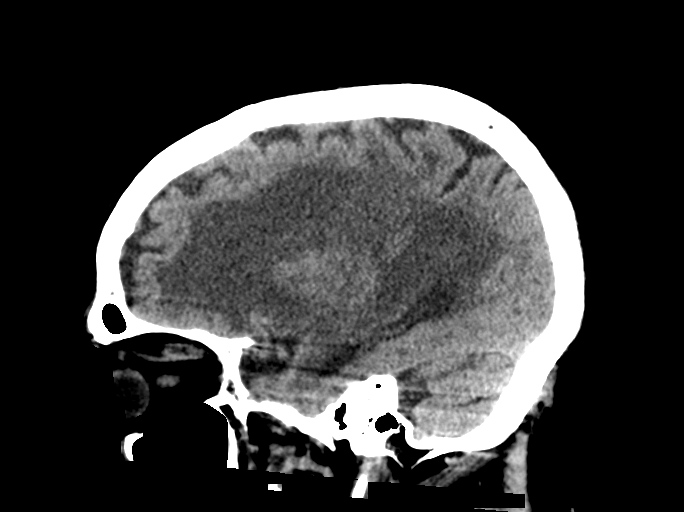
[im 31/62  brain]
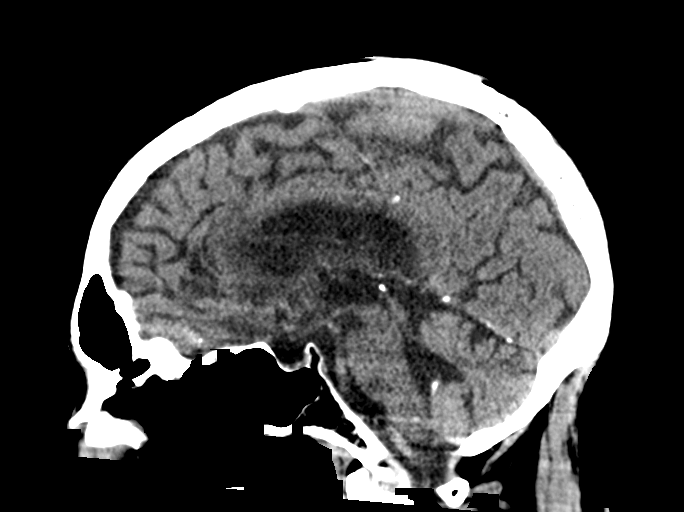
[im 41/62  brain]
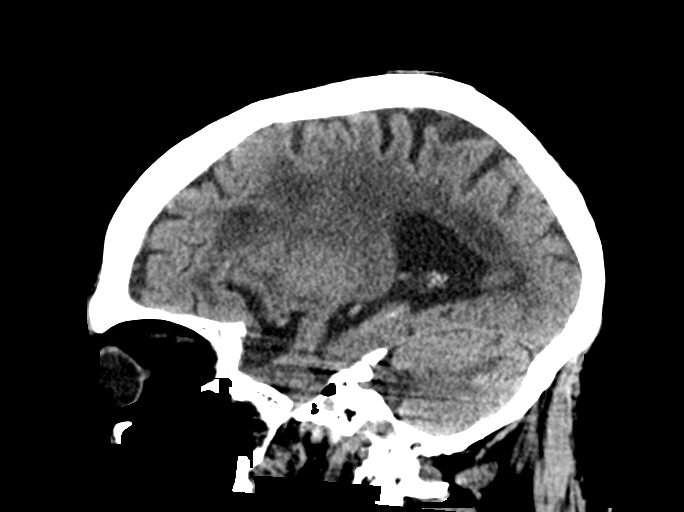

[15 of 47 positions shown; findings below may reference images not displayed]

FINDINGS: Brain: Decreased size and attenuation of mass within the right
anterior basal ganglia measuring up to 21 mm, previously 27 mm on
prior CT of head. Stable large surrounding region of hypoattenuation
in right frontal and temporal lobes as well as the basal ganglia
compatible with edema. Decreased associated mass effect with partial
effacement of the right lateral ventricle and 6 mm right-to-left
midline shift. Stable subcentimeter left frontal region meningioma.
Stable background of chronic microvascular ischemic changes and
volume loss of the brain. No new stroke, hemorrhage, or focus of
mass effect identified.

Vascular: Calcific atherosclerosis of carotid siphons. No hyperdense
vessel identified.

Skull: Normal. Negative for fracture or focal lesion.

Sinuses/Orbits: Small right maxillary sinus fluid level. Additional
visible paranasal sinuses and the mastoid air cells are normally
aerated. Orbits are unremarkable.

Other: None.
IMPRESSION: 1. Decreased size and attenuation of mass within the right anterior
basal ganglia in comparison with prior CT of head.
2. Stable distribution of surrounding edema in right frontal and
temporal lobes as well as basal ganglia.
3. Mildly decreased mass effect with persistent partial effacement
of right lateral ventricle and 6 mm right-to-left midline shift.
4. No new acute intracranial abnormality identified.

By: Rtoyota Joshjax M.D.
